# Patient Record
Sex: Male | Born: 1985 | Race: White | Hispanic: No | Marital: Married | Smoking: Never smoker
Health system: Southern US, Community
[De-identification: ages and names within clinical notes are randomized; demographics above are authoritative.]

## PROBLEM LIST (undated history)

## (undated) DIAGNOSIS — F419 Anxiety disorder, unspecified: Secondary | ICD-10-CM

## (undated) DIAGNOSIS — I10 Essential (primary) hypertension: Secondary | ICD-10-CM

## (undated) DIAGNOSIS — N289 Disorder of kidney and ureter, unspecified: Secondary | ICD-10-CM

## (undated) DIAGNOSIS — R0600 Dyspnea, unspecified: Secondary | ICD-10-CM

## (undated) DIAGNOSIS — J449 Chronic obstructive pulmonary disease, unspecified: Secondary | ICD-10-CM

## (undated) DIAGNOSIS — R0609 Other forms of dyspnea: Secondary | ICD-10-CM

## (undated) DIAGNOSIS — R079 Chest pain, unspecified: Secondary | ICD-10-CM

## (undated) DIAGNOSIS — J45909 Unspecified asthma, uncomplicated: Secondary | ICD-10-CM

## (undated) DIAGNOSIS — K760 Fatty (change of) liver, not elsewhere classified: Secondary | ICD-10-CM

## (undated) DIAGNOSIS — F319 Bipolar disorder, unspecified: Secondary | ICD-10-CM

## (undated) DIAGNOSIS — G4730 Sleep apnea, unspecified: Secondary | ICD-10-CM

## (undated) HISTORY — DX: Anxiety disorder, unspecified: F41.9

## (undated) HISTORY — PX: BACK SURGERY: SHX140

## (undated) HISTORY — DX: Morbid (severe) obesity due to excess calories: E66.01

## (undated) HISTORY — DX: Chronic obstructive pulmonary disease, unspecified: J44.9

## (undated) HISTORY — DX: Dyspnea, unspecified: R06.00

## (undated) HISTORY — DX: Bipolar disorder, unspecified: F31.9

## (undated) HISTORY — DX: Other forms of dyspnea: R06.09

## (undated) HISTORY — DX: Fatty (change of) liver, not elsewhere classified: K76.0

## (undated) HISTORY — PX: CARDIAC CATHETERIZATION: SHX172

## (undated) HISTORY — DX: Chest pain, unspecified: R07.9

## (undated) HISTORY — DX: Sleep apnea, unspecified: G47.30

---

## 2004-11-02 ENCOUNTER — Emergency Department: Payer: Self-pay | Admitting: Unknown Physician Specialty

## 2005-02-04 ENCOUNTER — Ambulatory Visit: Payer: Self-pay | Admitting: Pain Medicine

## 2005-03-04 ENCOUNTER — Ambulatory Visit: Payer: Self-pay | Admitting: Pain Medicine

## 2005-03-27 ENCOUNTER — Ambulatory Visit: Payer: Self-pay | Admitting: Pain Medicine

## 2005-04-14 ENCOUNTER — Ambulatory Visit: Payer: Self-pay | Admitting: Physician Assistant

## 2005-05-24 ENCOUNTER — Emergency Department: Payer: Self-pay | Admitting: Unknown Physician Specialty

## 2005-06-09 ENCOUNTER — Encounter: Payer: Self-pay | Admitting: Pain Medicine

## 2005-06-11 ENCOUNTER — Ambulatory Visit: Payer: Self-pay | Admitting: Pain Medicine

## 2005-07-09 ENCOUNTER — Encounter: Payer: Self-pay | Admitting: Pain Medicine

## 2005-10-03 ENCOUNTER — Other Ambulatory Visit: Payer: Self-pay

## 2005-10-08 ENCOUNTER — Inpatient Hospital Stay: Payer: Self-pay | Admitting: Unknown Physician Specialty

## 2006-04-01 ENCOUNTER — Emergency Department: Payer: Self-pay | Admitting: Emergency Medicine

## 2006-08-04 ENCOUNTER — Emergency Department: Payer: Self-pay | Admitting: Emergency Medicine

## 2006-08-12 ENCOUNTER — Emergency Department: Payer: Self-pay | Admitting: Emergency Medicine

## 2007-10-25 ENCOUNTER — Emergency Department: Payer: Self-pay | Admitting: Emergency Medicine

## 2007-10-25 ENCOUNTER — Other Ambulatory Visit: Payer: Self-pay

## 2009-09-27 ENCOUNTER — Emergency Department: Payer: Self-pay | Admitting: Emergency Medicine

## 2011-02-19 ENCOUNTER — Emergency Department: Payer: Self-pay | Admitting: Emergency Medicine

## 2011-02-25 ENCOUNTER — Emergency Department: Payer: Self-pay | Admitting: Emergency Medicine

## 2011-03-10 ENCOUNTER — Ambulatory Visit: Payer: Self-pay

## 2016-09-08 HISTORY — PX: SHOULDER ARTHROSCOPY: SHX128

## 2017-08-07 DIAGNOSIS — I10 Essential (primary) hypertension: Secondary | ICD-10-CM | POA: Insufficient documentation

## 2017-08-07 DIAGNOSIS — E109 Type 1 diabetes mellitus without complications: Secondary | ICD-10-CM | POA: Insufficient documentation

## 2017-08-07 DIAGNOSIS — J4551 Severe persistent asthma with (acute) exacerbation: Secondary | ICD-10-CM | POA: Insufficient documentation

## 2017-08-26 DIAGNOSIS — G4733 Obstructive sleep apnea (adult) (pediatric): Secondary | ICD-10-CM | POA: Insufficient documentation

## 2017-08-26 DIAGNOSIS — Z9989 Dependence on other enabling machines and devices: Secondary | ICD-10-CM | POA: Insufficient documentation

## 2018-11-10 ENCOUNTER — Ambulatory Visit: Payer: Self-pay | Admitting: Family Medicine

## 2019-03-15 ENCOUNTER — Emergency Department: Payer: Self-pay

## 2019-03-15 ENCOUNTER — Encounter: Payer: Self-pay | Admitting: Emergency Medicine

## 2019-03-15 ENCOUNTER — Emergency Department
Admission: EM | Admit: 2019-03-15 | Discharge: 2019-03-15 | Disposition: A | Discharge status: Home / Self Care | Payer: Self-pay | Attending: Emergency Medicine | Admitting: Emergency Medicine

## 2019-03-15 ENCOUNTER — Other Ambulatory Visit: Payer: Self-pay

## 2019-03-15 DIAGNOSIS — R51 Headache: Secondary | ICD-10-CM | POA: Insufficient documentation

## 2019-03-15 DIAGNOSIS — I10 Essential (primary) hypertension: Secondary | ICD-10-CM | POA: Insufficient documentation

## 2019-03-15 DIAGNOSIS — J4520 Mild intermittent asthma, uncomplicated: Secondary | ICD-10-CM | POA: Insufficient documentation

## 2019-03-15 DIAGNOSIS — R0602 Shortness of breath: Secondary | ICD-10-CM | POA: Insufficient documentation

## 2019-03-15 HISTORY — DX: Unspecified asthma, uncomplicated: J45.909

## 2019-03-15 HISTORY — DX: Disorder of kidney and ureter, unspecified: N28.9

## 2019-03-15 HISTORY — DX: Essential (primary) hypertension: I10

## 2019-03-15 LAB — URINALYSIS, COMPLETE (UACMP) WITH MICROSCOPIC
Bacteria, UA: NONE SEEN
Bilirubin Urine: NEGATIVE
Glucose, UA: NEGATIVE mg/dL
Hgb urine dipstick: NEGATIVE
Ketones, ur: NEGATIVE mg/dL
Leukocytes,Ua: NEGATIVE
Nitrite: NEGATIVE
Protein, ur: NEGATIVE mg/dL
Specific Gravity, Urine: 1.026 (ref 1.005–1.030)
pH: 5 (ref 5.0–8.0)

## 2019-03-15 LAB — HEPATIC FUNCTION PANEL
ALT: 31 U/L (ref 0–44)
AST: 21 U/L (ref 15–41)
Albumin: 4.3 g/dL (ref 3.5–5.0)
Alkaline Phosphatase: 70 U/L (ref 38–126)
Bilirubin, Direct: 0.1 mg/dL (ref 0.0–0.2)
Indirect Bilirubin: 0.6 mg/dL (ref 0.3–0.9)
Total Bilirubin: 0.7 mg/dL (ref 0.3–1.2)
Total Protein: 7.6 g/dL (ref 6.5–8.1)

## 2019-03-15 LAB — CBC
HCT: 41.6 % (ref 39.0–52.0)
Hemoglobin: 14 g/dL (ref 13.0–17.0)
MCH: 27 pg (ref 26.0–34.0)
MCHC: 33.7 g/dL (ref 30.0–36.0)
MCV: 80.2 fL (ref 80.0–100.0)
Platelets: 295 10*3/uL (ref 150–400)
RBC: 5.19 MIL/uL (ref 4.22–5.81)
RDW: 13.2 % (ref 11.5–15.5)
WBC: 9.4 10*3/uL (ref 4.0–10.5)
nRBC: 0 % (ref 0.0–0.2)

## 2019-03-15 LAB — BASIC METABOLIC PANEL
Anion gap: 10 (ref 5–15)
BUN: 19 mg/dL (ref 6–20)
CO2: 25 mmol/L (ref 22–32)
Calcium: 9 mg/dL (ref 8.9–10.3)
Chloride: 104 mmol/L (ref 98–111)
Creatinine, Ser: 0.7 mg/dL (ref 0.61–1.24)
GFR calc Af Amer: >60 mL/min (ref >60)
GFR calc non Af Amer: >60 mL/min (ref >60)
Glucose, Bld: 125 mg/dL — ABNORMAL HIGH (ref 70–99)
Potassium: 3.8 mmol/L (ref 3.5–5.1)
Sodium: 139 mmol/L (ref 135–145)

## 2019-03-15 LAB — TROPONIN I (HIGH SENSITIVITY): Troponin I (High Sensitivity): 3 ng/L (ref <18)

## 2019-03-15 MED ORDER — ALBUTEROL SULFATE (2.5 MG/3ML) 0.083% IN NEBU: 2.5000 mg | INHALATION_SOLUTION | Freq: Four times a day (QID) | RESPIRATORY_TRACT | 12 refills | Status: DC | PRN

## 2019-03-15 MED ORDER — IPRATROPIUM-ALBUTEROL 0.5-2.5 (3) MG/3ML IN SOLN
3.0000 mL | Freq: Once | RESPIRATORY_TRACT | Status: AC
  Administered 2019-03-15: 3 mL via RESPIRATORY_TRACT
  Filled 2019-03-15: qty 3

## 2019-03-15 MED ORDER — SODIUM CHLORIDE 0.9% FLUSH: 3.0000 mL | Freq: Once | INTRAVENOUS | Status: DC

## 2019-03-15 MED ORDER — PREDNISONE 20 MG PO TABS
60.0000 mg | ORAL_TABLET | Freq: Once | ORAL | Status: AC
  Administered 2019-03-15: 60 mg via ORAL
  Filled 2019-03-15: qty 3

## 2019-03-15 MED ORDER — ALBUTEROL SULFATE HFA 108 (90 BASE) MCG/ACT IN AERS: 2.0000 | INHALATION_SPRAY | Freq: Four times a day (QID) | RESPIRATORY_TRACT | 0 refills | Status: DC | PRN

## 2019-03-15 NOTE — ED Notes (Signed)
EDP at bedside  

## 2019-03-15 NOTE — ED Notes (Signed)
Pt in xray

## 2019-03-15 NOTE — ED Triage Notes (Addendum)
Says chest pain middle of chest that is tight and sharp for 2 weeks with shortness of breath.  For past 2 days he says his back hurts where his kidneys are.   Also says pain in left eye painf or 3 weeks

## 2019-03-15 NOTE — Discharge Instructions (Signed)
I prescribed you both the albuterol inhaler or the nebulizer treatment.  Do not use both of them the same time.  Use 1 of the other.  You should call [name] drew to make a follow-up appointment for your other concerns.  You should also have your glucose rechecked.

## 2019-03-15 NOTE — ED Notes (Addendum)
Pt states he has been having SHOB for about 2 weeks and states his legs are swollen- pt states he has a history of having "fluid on the lungs" and states this feels similar- pt states he has been having chest pain the past couple days- states he cannot bend over to pick as it makes him too short of breath

## 2019-03-15 NOTE — ED Provider Notes (Signed)
[facility] Emergency Department Provider Note  ____________________________________________   First MD Initiated Contact with Patient 03/15/19 0945     (approximate)  I have reviewed the triage vital signs and the nursing notes.   HISTORY  Chief Complaint Chest Pain, Shortness of Breath, Flank Pain, Back Pain, and Eye Problem    HPI Christian Barrett is a 33 y.o. male with asthma who comes in with multiple complaints.  Patient says that he does not have a primary care doctor to follow-up on all of his concerns.  Patient primary concern is his shortness of breath.  Patient had 2 weeks of shortness of breath that is worse with moving around.  Patient says this feels similar to his prior asthma exacerbations.  He is concerned that he may have a history of fluid on his lungs but he is unsure.  Denies any history of heart failure.  The shortness of breath is mild, worse with ambulating, better at rest, intermittent.  Denies any coughing.  No leg swelling unilaterally, recent travel, recent surgery.  He has a little bit of associated chest discomfort with that.  He also endorses some chronic headaches with little bit of eye pain.  No weakness no slurred speech.  No history of stroke.  He has been taking Tylenol to help with the headaches.       Past Medical History:  Diagnosis Date  . Asthma   . Hypertension   . Renal disorder     There are no active problems to display for this patient.   Past Surgical History:  Procedure Laterality Date  . BACK SURGERY      Prior to Admission medications   Not on File    Allergies Lamotrigine and Tramadol  No family history on file.  Social History Social History   Tobacco Use  . Smoking status: Never Smoker  . Smokeless tobacco: Never Used  Substance Use Topics  . Alcohol use: Never    Frequency: Never  . Drug use: Not on file      Review of Systems Constitutional: No fever/chills Eyes: No visual  changes. ENT: No sore throat. Cardiovascular: Positive chest pain. Respiratory: Positive shortness of breath Gastrointestinal: No abdominal pain.  No nausea, no vomiting.  No diarrhea.  No constipation. Genitourinary: Negative for dysuria. Musculoskeletal: Negative for back pain. Skin: Negative for rash. Neurological: Positive headaches but negative for focal weakness or numbness. All other ROS negative ____________________________________________   PHYSICAL EXAM:  VITAL SIGNS: ED Triage Vitals [03/15/19 0850]  Enc Vitals Group     BP (!) 142/94     Pulse Rate (!) 104     Resp 18     Temp 98.8 F (37.1 C)     Temp Source Oral     SpO2 98 %     Weight (!) 350 lb (158.8 kg)     Height 5\' 5"  (1.651 m)     Head Circumference      Peak Flow      Pain Score 5     Pain Loc      Pain Edu?      Excl. in [place]?     Constitutional: Alert and oriented. Well appearing and in no acute distress.  Obese male Eyes: Conjunctivae are normal. EOMI. PERRL Head: Atraumatic. Nose: No congestion/rhinnorhea. Mouth/Throat: Mucous membranes are moist.   Neck: No stridor. Trachea Midline. FROM Cardiovascular: Slightly tachycardic regular rhythm. Grossly normal heart sounds.  Good peripheral circulation. Respiratory: Normal  respiratory effort.  Poor air exchange with minimal expiratory wheezing.  No retractions. Lungs CTAB. Gastrointestinal: Soft and nontender. No distention. No abdominal bruits.  Musculoskeletal: No lower extremity tenderness nor edema.  No joint effusions. Neurologic:  Normal speech and language.  Cranial nerves II through XII intact.  Equal strength bilaterally. Skin:  Skin is warm, dry and intact. No rash noted. Psychiatric: Mood and affect are normal. Speech and behavior are normal. GU: Deferred   ____________________________________________   LABS (all labs ordered are listed, but only abnormal results are displayed)  Labs Reviewed  BASIC METABOLIC PANEL - Abnormal;  Notable for the following components:      Result Value   Glucose, Bld 125 (*)    All other components within normal limits  URINALYSIS, COMPLETE (UACMP) WITH MICROSCOPIC - Abnormal; Notable for the following components:   Color, Urine YELLOW (*)    APPearance CLEAR (*)    All other components within normal limits  CBC  TROPONIN I (HIGH SENSITIVITY)  TROPONIN I (HIGH SENSITIVITY)   ____________________________________________   ED ECG REPORT I, Concha SeMary E Aziza Stuckert, the attending physician, personally viewed and interpreted this ECG. EKG normal sinus rate of 96, incomplete right bundle-branch block, no ST elevation, no T wave inversion.  ____________________________________________  RADIOLOGY Vela ProseI, Adriaan Maltese E Keiron Iodice, personally viewed and evaluated these images (plain radiographs) as part of my medical decision making, as well as reviewing the written report by the radiologist.  ED MD interpretation: Chest x-ray negative  Official radiology report(s): Dg Chest 2 View  Result Date: 03/15/2019 CLINICAL DATA:  Chest pain, shortness of breath. EXAM: CHEST - 2 VIEW COMPARISON:  Radiographs of October 03, 2005. FINDINGS: The heart size and mediastinal contours are within normal limits. Both lungs are clear. No pneumothorax or pleural effusion is noted. The visualized skeletal structures are unremarkable. IMPRESSION: No active cardiopulmonary disease. Electronically Signed   By: Lupita RaiderJames  Green Jr M.D.   On: 03/15/2019 09:58    ____________________________________________   PROCEDURES  Procedure(s) performed (including Critical Care):  Procedures   ____________________________________________   INITIAL IMPRESSION / ASSESSMENT AND PLAN / ED COURSE     Leamon ArntMatthew J Muff was evaluated in Emergency Department on 03/15/2019 for the symptoms described in the history of present illness. He was evaluated in the context of the global COVID-19 pandemic, which necessitated consideration that the patient  might be at risk for infection with the SARS-CoV-2 virus that causes COVID-19. Institutional protocols and algorithms that pertain to the evaluation of patients at risk for COVID-19 are in a state of rapid change based on information released by regulatory bodies including the CDC and federal and state organizations. These policies and algorithms were followed during the patient's care in the ED.     Patient has multiple concerns.  He is not been able take any of his medications for his asthma.  Given patient's exam this is most likely an asthma flare.  Will give patient a DuoNeb and reassess patient's shortness of breath.  Will get cardiac markers to evaluate for ACS.  Will get a urine to evaluate for UTI.  Will get chest x-ray to evaluate for pneumonia.  Consider PE but seems unlikely given patient has no unilateral leg swelling, recent travel, surgeries.  He also is on his feet 8 hours a day at work.  It seems that his symptoms are more likely related to his asthma and not being on his medications.  For patient's headaches he has a normal neuro exam.  Low suspicion for intracranial mass.  His eye pain is most likely related to his migraines given that it comes on at the same time.  No evidence of glaucoma on examination.  Encourage symptomatic treatment at this time but can follow-up with primary care doctor if headaches are continuing.    Clinical Course as of Mar 14 1010  Tue Mar 15, 2019  [id number] Urine negative for infection.  Troponin is 3.  Glucose slightly elevated at 125   [MF]  1010 Chest xray negative   [MF]    Clinical Course User Index [MF] Concha SeFunke, Jhase Creppel E, MD    11:23 AM reevaluated patient after DuoNeb.  Patient is feeling better.  Physical shortness of breath is much better.  Patient also brings up another concern of some bloating feeling in his stomach.  Low suspicion this is related to edema given normal chest x-ray.  However will get LFTs to evaluate for the possibility of liver  issues.  Patient does not want to wait for this test to come back.  Patient would prefer to leave.  Patient would like follow-up primary care doctor as well as prescriptions for his inhalers.  He does not want steroids given that it makes him feel even more bloated.  Patient understands he should return the ER if he develops worsening shortness of breath or any other concerns.  Patient requesting a prescription for his albuterol nebulizer as well as inhaler.  Patient instructed not to use them combination.  Patient also alerted that his glucose was elevated and that he needs to have this followed up because he may be developing early onset diabetes. ____________________________________________   FINAL CLINICAL IMPRESSION(S) / ED DIAGNOSES   Final diagnoses:  Mild intermittent asthma, unspecified whether complicated      MEDICATIONS GIVEN DURING THIS VISIT:  Medications  ipratropium-albuterol (DUONEB) 0.5-2.5 (3) MG/3ML nebulizer solution 3 mL (3 mLs Nebulization Given 03/15/19 1034)  predniSONE (DELTASONE) tablet 60 mg (60 mg Oral Given 03/15/19 1031)     ED Discharge Orders         Ordered    albuterol (PROVENTIL) (2.5 MG/3ML) 0.083% nebulizer solution  Every 6 hours PRN     03/15/19 1133    albuterol (VENTOLIN HFA) 108 (90 Base) MCG/ACT inhaler  Every 6 hours PRN     03/15/19 1133           Note:  This document was prepared using Dragon voice recognition software and may include unintentional dictation errors.   Concha SeFunke, Lachlan Pelto E, MD 03/15/19 1620

## 2019-05-12 ENCOUNTER — Other Ambulatory Visit: Payer: Self-pay

## 2019-05-12 DIAGNOSIS — Z20822 Contact with and (suspected) exposure to covid-19: Secondary | ICD-10-CM

## 2019-05-13 LAB — NOVEL CORONAVIRUS, NAA: SARS-CoV-2, NAA: NOT DETECTED

## 2019-05-19 ENCOUNTER — Emergency Department
Admission: EM | Admit: 2019-05-19 | Discharge: 2019-05-19 | Disposition: A | Discharge status: Home / Self Care | Payer: Self-pay | Attending: Emergency Medicine | Admitting: Emergency Medicine

## 2019-05-19 ENCOUNTER — Other Ambulatory Visit: Payer: Self-pay

## 2019-05-19 DIAGNOSIS — J069 Acute upper respiratory infection, unspecified: Secondary | ICD-10-CM | POA: Insufficient documentation

## 2019-05-19 DIAGNOSIS — J45909 Unspecified asthma, uncomplicated: Secondary | ICD-10-CM | POA: Insufficient documentation

## 2019-05-19 DIAGNOSIS — Z20828 Contact with and (suspected) exposure to other viral communicable diseases: Secondary | ICD-10-CM | POA: Insufficient documentation

## 2019-05-19 DIAGNOSIS — I10 Essential (primary) hypertension: Secondary | ICD-10-CM | POA: Insufficient documentation

## 2019-05-19 MED ORDER — ALBUTEROL SULFATE (2.5 MG/3ML) 0.083% IN NEBU: 2.5000 mg | INHALATION_SOLUTION | Freq: Four times a day (QID) | RESPIRATORY_TRACT | 12 refills | Status: DC | PRN

## 2019-05-19 NOTE — ED Notes (Signed)
Pt states that starting yesterday he developed a sore throat, cough, runny nose, and headache. States that he has a fever of 100.1 yesterday but has taken nyquil which has reduced his fever.

## 2019-05-19 NOTE — ED Provider Notes (Signed)
[facility] Emergency Department Provider Note  ____________________________________________  Time seen: Approximately 8:20 PM  I have reviewed the triage vital signs and the nursing notes.   HISTORY  Chief Complaint URI    HPI Christian Barrett is a 33 y.o. male presents to the emergency department with low-grade fever, pharyngitis, nasal congestion and rhinorrhea for the past 2 to 3 days.  Patient denies chest pain, chest tightness and shortness of breath.  No emesis or diarrhea.  Patient states that he was tested for COVID-19 several days ago and tested negative but states that he is due to return to work and he still has fever.  No known contacts with COVID-19.  Patient currently works in a [employer].        Past Medical History:  Diagnosis Date  . Asthma   . Hypertension   . Renal disorder     There are no active problems to display for this patient.   Past Surgical History:  Procedure Laterality Date  . BACK SURGERY      Prior to Admission medications   Medication Sig Start Date End Date Taking? Authorizing Provider  albuterol (PROVENTIL) (2.5 MG/3ML) 0.083% nebulizer solution Take 3 mLs (2.5 mg total) by nebulization every 6 (six) hours as needed for wheezing or shortness of breath. 05/19/19   Lannie Fields, PA-C    Allergies Lamotrigine and Tramadol  No family history on file.  Social History Social History   Tobacco Use  . Smoking status: Never Smoker  . Smokeless tobacco: Never Used  Substance Use Topics  . Alcohol use: Never    Frequency: Never  . Drug use: Not on file      Review of Systems  Constitutional: Patient has fever.  Eyes: No visual changes. No discharge ENT: Patient has congestion.  Cardiovascular: no chest pain. Respiratory: Patient has cough.  Gastrointestinal: No abdominal pain.  No nausea, no vomiting. Patient had diarrhea.  Genitourinary: Negative for dysuria. No hematuria Musculoskeletal: Patient  has myalgias.  Skin: Negative for rash, abrasions, lacerations, ecchymosis. Neurological: Patient has headache, no focal weakness or numbness.    ____________________________________________   PHYSICAL EXAM:  VITAL SIGNS: ED Triage Vitals  Enc Vitals Group     BP 05/19/19 1541 (!) 133/92     Pulse Rate 05/19/19 1541 99     Resp 05/19/19 1541 17     Temp 05/19/19 1541 98.6 F (37 C)     Temp Source 05/19/19 1541 Oral     SpO2 05/19/19 1541 98 %     Weight 05/19/19 1542 (!) 350 lb (158.8 kg)     Height 05/19/19 1542 5\' 5"  (1.651 m)     Head Circumference --      Peak Flow --      Pain Score 05/19/19 1542 3     Pain Loc --      Pain Edu? --      Excl. in [place]? --     Constitutional: Alert and oriented. Patient is lying supine. Eyes: Conjunctivae are normal. PERRL. EOMI. Head: Atraumatic. ENT:      Ears: Tympanic membranes are mildly injected with mild effusion bilaterally.       Nose: No congestion/rhinnorhea.      Mouth/Throat: Mucous membranes are moist. Posterior pharynx is mildly erythematous.  Hematological/Lymphatic/Immunilogical: No cervical lymphadenopathy.  Cardiovascular: Normal rate, regular rhythm. Normal S1 and S2.  Good peripheral circulation. Respiratory: Normal respiratory effort without tachypnea or retractions. Lungs CTAB. Good air entry  to the bases with no decreased or absent breath sounds. Gastrointestinal: Bowel sounds 4 quadrants. Soft and nontender to palpation. No guarding or rigidity. No palpable masses. No distention. No CVA tenderness. Musculoskeletal: Full range of motion to all extremities. No gross deformities appreciated. Neurologic:  Normal speech and language. No gross focal neurologic deficits are appreciated.  Skin:  Skin is warm, dry and intact. No rash noted. Psychiatric: Mood and affect are normal. Speech and behavior are normal. Patient exhibits appropriate insight and judgement.    ____________________________________________    LABS (all labs ordered are listed, but only abnormal results are displayed)  Labs Reviewed  SARS CORONAVIRUS 2 (TAT 6-24 HRS)   ____________________________________________  EKG   ____________________________________________  RADIOLOGY   No results found.  ____________________________________________    PROCEDURES  Procedure(s) performed:    Procedures    Medications - No data to display   ____________________________________________   INITIAL IMPRESSION / ASSESSMENT AND PLAN / ED COURSE  Pertinent labs & imaging results that were available during my care of the patient were reviewed by me and considered in my medical decision making (see chart for details).  Review of the [place] CSRS was performed in accordance of the [facility] prior to dispensing any controlled drugs.         Assessment and plan Viral URI 33 year old male presents to the emergency department with pharyngitis, cough, rhinorrhea and headache.  Patient is also had low-grade fever.  Patient was tested for COVID-19.  Results are pending.  Patient was advised to 72 hours after fever resolves.  Tylenol was recommended for fever.  All patient questions were answered.  Christian Barrett was evaluated in Emergency Department on 05/19/2019 for the symptoms described in the history of present illness. He was evaluated in the context of the global COVID-19 pandemic, which necessitated consideration that the patient might be at risk for infection with the SARS-CoV-2 virus that causes COVID-19. Institutional protocols and algorithms that pertain to the evaluation of patients at risk for COVID-19 are in a state of rapid change based on information released by regulatory bodies including the CDC and federal and state organizations. These policies and algorithms were followed during the patient's care in the ED.     ____________________________________________  FINAL CLINICAL IMPRESSION(S) / ED DIAGNOSES  Final  diagnoses:  Viral upper respiratory tract infection      NEW MEDICATIONS STARTED DURING THIS VISIT:  ED Discharge Orders         Ordered    albuterol (PROVENTIL) (2.5 MG/3ML) 0.083% nebulizer solution  Every 6 hours PRN     05/19/19 1649              This chart was dictated using voice recognition software/Dragon. Despite best efforts to proofread, errors can occur which can change the meaning. Any change was purely unintentional.    Orvil FeilWoods, Jaclyn M, PA-C 05/19/19 2158    Concha SeFunke, Mary E, MD 05/24/19 1155

## 2019-05-19 NOTE — ED Triage Notes (Signed)
Pt c/o cough, sore throat with fever since yesterday. States he had abd pain last week and had neg test.

## 2019-05-19 NOTE — Discharge Instructions (Signed)
Stay quarantined at home for 7 days in early 72 hours after fever resolves.

## 2019-05-20 LAB — SARS CORONAVIRUS 2 (TAT 6-24 HRS): SARS Coronavirus 2: NEGATIVE

## 2019-07-08 ENCOUNTER — Other Ambulatory Visit: Payer: Self-pay

## 2019-07-08 DIAGNOSIS — Z20822 Contact with and (suspected) exposure to covid-19: Secondary | ICD-10-CM

## 2019-07-09 LAB — NOVEL CORONAVIRUS, NAA: SARS-CoV-2, NAA: NOT DETECTED

## 2019-07-17 ENCOUNTER — Emergency Department
Admission: EM | Admit: 2019-07-17 | Discharge: 2019-07-17 | Disposition: A | Discharge status: Home / Self Care | Payer: Self-pay | Attending: Emergency Medicine | Admitting: Emergency Medicine

## 2019-07-17 ENCOUNTER — Emergency Department: Payer: Self-pay

## 2019-07-17 ENCOUNTER — Other Ambulatory Visit: Payer: Self-pay

## 2019-07-17 DIAGNOSIS — J45909 Unspecified asthma, uncomplicated: Secondary | ICD-10-CM | POA: Insufficient documentation

## 2019-07-17 DIAGNOSIS — K8020 Calculus of gallbladder without cholecystitis without obstruction: Secondary | ICD-10-CM

## 2019-07-17 DIAGNOSIS — K828 Other specified diseases of gallbladder: Secondary | ICD-10-CM

## 2019-07-17 DIAGNOSIS — I10 Essential (primary) hypertension: Secondary | ICD-10-CM | POA: Insufficient documentation

## 2019-07-17 DIAGNOSIS — Z79899 Other long term (current) drug therapy: Secondary | ICD-10-CM | POA: Insufficient documentation

## 2019-07-17 DIAGNOSIS — K829 Disease of gallbladder, unspecified: Secondary | ICD-10-CM | POA: Insufficient documentation

## 2019-07-17 LAB — URINALYSIS, COMPLETE (UACMP) WITH MICROSCOPIC
Bacteria, UA: NONE SEEN
Bilirubin Urine: NEGATIVE
Glucose, UA: NEGATIVE mg/dL
Hgb urine dipstick: NEGATIVE
Ketones, ur: NEGATIVE mg/dL
Leukocytes,Ua: NEGATIVE
Nitrite: NEGATIVE
Protein, ur: NEGATIVE mg/dL
Specific Gravity, Urine: 1.025 (ref 1.005–1.030)
pH: 6 (ref 5.0–8.0)

## 2019-07-17 LAB — COMPREHENSIVE METABOLIC PANEL
ALT: 33 U/L (ref 0–44)
AST: 23 U/L (ref 15–41)
Albumin: 4.1 g/dL (ref 3.5–5.0)
Alkaline Phosphatase: 77 U/L (ref 38–126)
Anion gap: 11 (ref 5–15)
BUN: 17 mg/dL (ref 6–20)
CO2: 24 mmol/L (ref 22–32)
Calcium: 9 mg/dL (ref 8.9–10.3)
Chloride: 103 mmol/L (ref 98–111)
Creatinine, Ser: 0.61 mg/dL (ref 0.61–1.24)
GFR calc Af Amer: >60 mL/min (ref >60)
GFR calc non Af Amer: >60 mL/min (ref >60)
Glucose, Bld: 133 mg/dL — ABNORMAL HIGH (ref 70–99)
Potassium: 3.7 mmol/L (ref 3.5–5.1)
Sodium: 138 mmol/L (ref 135–145)
Total Bilirubin: 0.5 mg/dL (ref 0.3–1.2)
Total Protein: 7.5 g/dL (ref 6.5–8.1)

## 2019-07-17 LAB — LIPASE, BLOOD: Lipase: 32 U/L (ref 11–51)

## 2019-07-17 LAB — CBC
HCT: 41.3 % (ref 39.0–52.0)
Hemoglobin: 13.9 g/dL (ref 13.0–17.0)
MCH: 27 pg (ref 26.0–34.0)
MCHC: 33.7 g/dL (ref 30.0–36.0)
MCV: 80.2 fL (ref 80.0–100.0)
Platelets: 295 10*3/uL (ref 150–400)
RBC: 5.15 MIL/uL (ref 4.22–5.81)
RDW: 12.9 % (ref 11.5–15.5)
WBC: 11.3 10*3/uL — ABNORMAL HIGH (ref 4.0–10.5)
nRBC: 0 % (ref 0.0–0.2)

## 2019-07-17 MED ORDER — METOCLOPRAMIDE HCL 5 MG PO TABS: 5.0000 mg | ORAL_TABLET | Freq: Three times a day (TID) | ORAL | 0 refills | Status: DC | PRN

## 2019-07-17 MED ORDER — HYDROCODONE-ACETAMINOPHEN 5-325 MG PO TABS
1.0000 | ORAL_TABLET | Freq: Once | ORAL | Status: AC
  Administered 2019-07-17: 1 via ORAL
  Filled 2019-07-17: qty 1

## 2019-07-17 MED ORDER — HYDROCODONE-ACETAMINOPHEN 5-325 MG PO TABS: 1.0000 | ORAL_TABLET | Freq: Three times a day (TID) | ORAL | 0 refills | Status: AC | PRN

## 2019-07-17 MED ORDER — PANTOPRAZOLE SODIUM 20 MG PO TBEC: 20.0000 mg | DELAYED_RELEASE_TABLET | Freq: Every day | ORAL | 2 refills | Status: DC

## 2019-07-17 MED ORDER — METOCLOPRAMIDE HCL 10 MG PO TABS
10.0000 mg | ORAL_TABLET | Freq: Once | ORAL | Status: AC
  Administered 2019-07-17: 10 mg via ORAL
  Filled 2019-07-17: qty 1

## 2019-07-17 MED ORDER — LISINOPRIL 10 MG PO TABS: 10.0000 mg | ORAL_TABLET | Freq: Every day | ORAL | 2 refills | Status: DC

## 2019-07-17 MED ORDER — LISINOPRIL 10 MG PO TABS
10.0000 mg | ORAL_TABLET | Freq: Once | ORAL | Status: AC
  Administered 2019-07-17: 10 mg via ORAL
  Filled 2019-07-17: qty 1

## 2019-07-17 MED ORDER — SUCRALFATE 1 G PO TABS
1.0000 g | ORAL_TABLET | Freq: Once | ORAL | Status: AC
  Administered 2019-07-17: 1 g via ORAL
  Filled 2019-07-17: qty 1

## 2019-07-17 MED ORDER — SUCRALFATE 1 G PO TABS: 1.0000 g | ORAL_TABLET | Freq: Four times a day (QID) | ORAL | 0 refills | Status: DC

## 2019-07-17 NOTE — ED Notes (Signed)
Pt has ruq pain for 1 week.  Pain radiates into back.  No n/v  No urinary sx.  Pt alert  Speech clear.

## 2019-07-17 NOTE — ED Triage Notes (Addendum)
Pt c/o RUQ pain and mid back pain immediately after eating. Pt states he also wakes up every morning with rotten egg taste in mouth.   Pt states his only relief is when he is laying on his stomach.  Pt has been taking Midol for the pain.

## 2019-07-17 NOTE — ED Provider Notes (Signed)
[facility] Emergency Department Provider Note ____________________________________________  Time seen: 2055  I have reviewed the triage vital signs and the nursing notes.  HISTORY  Chief Complaint  Abdominal Pain  HPI Christian Barrett is a 33 y.o. male presents to the ED for evaluation of RUQ pain over the last week. The pain is worsened after a high-fat meal. He notes associated nausea without vomiting, and crampy pain about 15 minutes after eating. The pain is resolved with lying on his back for about an hour. He denies fevers, chills, or vomiting. He is without chest pain or SOB. He reports light brown stools without diarrhea.    Past Medical History:  Diagnosis Date  . Asthma   . Hypertension   . Renal disorder     There are no active problems to display for this patient.   Past Surgical History:  Procedure Laterality Date  . BACK SURGERY      Prior to Admission medications   Medication Sig Start Date End Date Taking? Authorizing Provider  albuterol (PROVENTIL) (2.5 MG/3ML) 0.083% nebulizer solution Take 3 mLs (2.5 mg total) by nebulization every 6 (six) hours as needed for wheezing or shortness of breath. 05/19/19   Orvil FeilWoods, Jaclyn M, PA-C  HYDROcodone-acetaminophen (NORCO) 5-325 MG tablet Take 1 tablet by mouth 3 (three) times daily as needed for up to 5 days. 07/17/19 07/22/19  Kyree Fedorko, Charlesetta IvoryJenise V Bacon, PA-C  lisinopril (ZESTRIL) 10 MG tablet Take 1 tablet (10 mg total) by mouth daily. 07/17/19 10/15/19  Shakyia Bosso, Charlesetta IvoryJenise V Bacon, PA-C  metoCLOPramide (REGLAN) 5 MG tablet Take 1 tablet (5 mg total) by mouth every 8 (eight) hours as needed for up to 5 days for nausea or vomiting. 07/17/19 07/22/19  Dorman Calderwood, Charlesetta IvoryJenise V Bacon, PA-C  pantoprazole (PROTONIX) 20 MG tablet Take 1 tablet (20 mg total) by mouth daily. 07/17/19 10/15/19  Kavari Parrillo, Charlesetta IvoryJenise V Bacon, PA-C  sucralfate (CARAFATE) 1 g tablet Take 1 tablet (1 g total) by mouth 4 (four) times daily. 07/17/19 08/16/19   Lynda Capistran, Charlesetta IvoryJenise V Bacon, PA-C    Allergies Lamotrigine and Tramadol  No family history on file.  Social History Social History   Tobacco Use  . Smoking status: Never Smoker  . Smokeless tobacco: Never Used  Substance Use Topics  . Alcohol use: Never    Frequency: Never  . Drug use: Not on file    Review of Systems  Constitutional: Negative for fever. Eyes: Negative for visual changes. ENT: Negative for sore throat. Cardiovascular: Negative for chest pain. Respiratory: Negative for shortness of breath. Gastrointestinal: Positive for abdominal pain and vomiting. Denies constipation and diarrhea. Genitourinary: Negative for dysuria. Musculoskeletal: Negative for back pain. Skin: Negative for rash. Neurological: Negative for headaches, focal weakness or numbness. ____________________________________________  PHYSICAL EXAM:  VITAL SIGNS: ED Triage Vitals  Enc Vitals Group     BP 07/17/19 1746 (!) 149/74     Pulse --      Resp 07/17/19 1746 18     Temp 07/17/19 1746 99 F (37.2 C)     Temp Source 07/17/19 1746 Oral     SpO2 07/17/19 1746 96 %     Weight 07/17/19 1742 (!) 350 lb (158.8 kg)     Height 07/17/19 1742 5\' 5"  (1.651 m)     Head Circumference --      Peak Flow --      Pain Score 07/17/19 1741 7     Pain Loc --  Pain Edu? --      Excl. in [place]? --     Constitutional: Alert and oriented. Well appearing and in no distress. Head: Normocephalic and atraumatic. Eyes: Conjunctivae are normal. Normal extraocular movements Cardiovascular: Normal rate, regular rhythm. Normal distal pulses. Respiratory: Normal respiratory effort. No wheezes/rales/rhonchi. Gastrointestinal: Soft, protuberant, and mildly tender to palpation. No distention. Positive inspiratory halt with palpation over the RUQ. Normal bowel sounds noted No CVA tenderness.  Musculoskeletal: Nontender with normal range of motion in all extremities.  Neurologic:  Normal gait without ataxia. Normal  speech and language. No gross focal neurologic deficits are appreciated. Skin:  Skin is warm, dry and intact. No rash noted. ____________________________________________   LABS (pertinent positives/negatives)  Labs Reviewed  COMPREHENSIVE METABOLIC PANEL - Abnormal; Notable for the following components:      Result Value   Glucose, Bld 133 (*)    All other components within normal limits  CBC - Abnormal; Notable for the following components:   WBC 11.3 (*)    All other components within normal limits  URINALYSIS, COMPLETE (UACMP) WITH MICROSCOPIC - Abnormal; Notable for the following components:   Color, Urine YELLOW (*)    APPearance CLEAR (*)    All other components within normal limits  LIPASE, BLOOD  ____________________________________________   EKG  See Report   _____________________________________________  RADIOLOGY  [place] RUQ Limited  IMPRESSION: 1. Contracted, but otherwise normal, gallbladder. 2. Hepatic steatosis. ____________________________________________  PROCEDURES  Norco 5-325 mg PO Carafate 1 g PO Reglan 10 mg PO Procedures ____________________________________________  INITIAL IMPRESSION / ASSESSMENT AND PLAN / ED COURSE  Differential diagnosis includes, but is not limited to, biliary disease (biliary colic, acute cholecystitis, cholangitis, choledocholithiasis, etc), intrathoracic causes for epigastric abdominal pain including ACS, gastritis, duodenitis, pancreatitis, small bowel or large bowel obstruction, abdominal aortic aneurysm, hernia, and ulcer(s).  Patient with ED evaluation of a 1 week complaint of intermittent right upper quadrant pain with accompanying nausea without vomiting.  Clinical picture is concerning for probable gallbladder etiology.  Patient's labs overall reassuring as it shows no white blood cell count elevation or elevated lipase at this time.  Ultrasound also does not show any gallstones, gallbladder wall thickening, sludge, or  other signs of acute abdominal process.  Patient will be discharged at this time instructions on a low-fat diet and management of gallbladder dysfunction/colic.  He will be discharged with prescriptions for Reglan, hydrocodone, Carafate, and Protonix.  He is also given a courtesy refill of his blood pressure medicine.  Patient was referred to GI for nonemergent management of his biliary colic.  He is also given strict return precautions for any worsening symptoms acutely.  He will follow-up as directed patient verbalized understanding of discharge directions.  STEPAN VERRETTE was evaluated in Emergency Department on 07/17/2019 for the symptoms described in the history of present illness. He was evaluated in the context of the global COVID-19 pandemic, which necessitated consideration that the patient might be at risk for infection with the SARS-CoV-2 virus that causes COVID-19. Institutional protocols and algorithms that pertain to the evaluation of patients at risk for COVID-19 are in a state of rapid change based on information released by regulatory bodies including the CDC and federal and state organizations. These policies and algorithms were followed during the patient's care in the ED.  I reviewed the patient's prescription history over the last 12 months in the multi-state controlled substances database(s) that includes [place], [place], [place], [place], [place], [place], [place], [place], [place],  [place], [place], [place], [place], and [place].  Results were notable for no current RX.  ____________________________________________  FINAL CLINICAL IMPRESSION(S) / ED DIAGNOSES  Final diagnoses:  Dysfunctional gallbladder  Gallbladder colic      Karmen Stabs, Charlesetta Ivory, PA-C 07/17/19 2241    Phineas Semen, MD 07/17/19 2241

## 2019-07-17 NOTE — Discharge Instructions (Signed)
Your exam, labs, and [place] are essentially normal at this time. You do not have any evidence of gallstones on ultrasound. You likely are having symptoms of poor gallbladder function. This can cause the pain and discomfort you have been experiencing. Take the prescription meds as directed. Drink plenty of fluids and increase fiber, fruits, and veggies. Adopt a lower fat diet and avoid spicy/greasy foods. Follow-up with the GI specialist above for routine management. Return to the ED immediately for sudden symptoms as discussed.

## 2019-07-25 ENCOUNTER — Emergency Department: Payer: Self-pay

## 2019-07-25 ENCOUNTER — Encounter: Payer: Self-pay | Admitting: Emergency Medicine

## 2019-07-25 ENCOUNTER — Other Ambulatory Visit: Payer: Self-pay

## 2019-07-25 ENCOUNTER — Emergency Department
Admission: EM | Admit: 2019-07-25 | Discharge: 2019-07-25 | Disposition: A | Discharge status: Home / Self Care | Payer: Self-pay | Attending: Emergency Medicine | Admitting: Emergency Medicine

## 2019-07-25 DIAGNOSIS — I10 Essential (primary) hypertension: Secondary | ICD-10-CM | POA: Insufficient documentation

## 2019-07-25 DIAGNOSIS — R1011 Right upper quadrant pain: Secondary | ICD-10-CM | POA: Insufficient documentation

## 2019-07-25 DIAGNOSIS — K760 Fatty (change of) liver, not elsewhere classified: Secondary | ICD-10-CM | POA: Insufficient documentation

## 2019-07-25 DIAGNOSIS — J45909 Unspecified asthma, uncomplicated: Secondary | ICD-10-CM | POA: Insufficient documentation

## 2019-07-25 DIAGNOSIS — Z79899 Other long term (current) drug therapy: Secondary | ICD-10-CM | POA: Insufficient documentation

## 2019-07-25 DIAGNOSIS — R1010 Upper abdominal pain, unspecified: Secondary | ICD-10-CM | POA: Insufficient documentation

## 2019-07-25 LAB — COMPREHENSIVE METABOLIC PANEL
ALT: 33 U/L (ref 0–44)
AST: 21 U/L (ref 15–41)
Albumin: 4.5 g/dL (ref 3.5–5.0)
Alkaline Phosphatase: 78 U/L (ref 38–126)
Anion gap: 11 (ref 5–15)
BUN: 17 mg/dL (ref 6–20)
CO2: 24 mmol/L (ref 22–32)
Calcium: 9.2 mg/dL (ref 8.9–10.3)
Chloride: 103 mmol/L (ref 98–111)
Creatinine, Ser: 0.55 mg/dL — ABNORMAL LOW (ref 0.61–1.24)
GFR calc Af Amer: >60 mL/min (ref >60)
GFR calc non Af Amer: >60 mL/min (ref >60)
Glucose, Bld: 112 mg/dL — ABNORMAL HIGH (ref 70–99)
Potassium: 4.1 mmol/L (ref 3.5–5.1)
Sodium: 138 mmol/L (ref 135–145)
Total Bilirubin: 0.6 mg/dL (ref 0.3–1.2)
Total Protein: 8.2 g/dL — ABNORMAL HIGH (ref 6.5–8.1)

## 2019-07-25 LAB — CBC
HCT: 42 % (ref 39.0–52.0)
Hemoglobin: 14 g/dL (ref 13.0–17.0)
MCH: 26.7 pg (ref 26.0–34.0)
MCHC: 33.3 g/dL (ref 30.0–36.0)
MCV: 80 fL (ref 80.0–100.0)
Platelets: 304 10*3/uL (ref 150–400)
RBC: 5.25 MIL/uL (ref 4.22–5.81)
RDW: 12.8 % (ref 11.5–15.5)
WBC: 10 10*3/uL (ref 4.0–10.5)
nRBC: 0 % (ref 0.0–0.2)

## 2019-07-25 LAB — LIPASE, BLOOD: Lipase: 30 U/L (ref 11–51)

## 2019-07-25 MED ORDER — MORPHINE SULFATE (PF) 4 MG/ML IV SOLN
4.0000 mg | Freq: Once | INTRAVENOUS | Status: AC
  Administered 2019-07-25: 12:00:00 4 mg via INTRAVENOUS
  Filled 2019-07-25: qty 1

## 2019-07-25 MED ORDER — ONDANSETRON HCL 4 MG/2ML IJ SOLN
4.0000 mg | Freq: Once | INTRAMUSCULAR | Status: AC
  Administered 2019-07-25: 12:00:00 4 mg via INTRAVENOUS
  Filled 2019-07-25: qty 2

## 2019-07-25 NOTE — Discharge Instructions (Signed)
Follow-up with [facility] or the [place] [facility].  Please call for an appointment.  Try to decrease your fatty food intake.  Exercise.  Avoid alcohol.

## 2019-07-25 NOTE — ED Notes (Signed)
Instructed not to eat or drink prior to u/s

## 2019-07-25 NOTE — ED Provider Notes (Signed)
[facility] Emergency Department Provider Note  ____________________________________________   First MD Initiated Contact with Patient 07/25/19 1112     (approximate)  I have reviewed the triage vital signs and the nursing notes.   HISTORY  Chief Complaint Abdominal Pain    HPI Christian Barrett is a 33 y.o. male presents emergency department complaining of right upper quadrant pain.  Patient was told he has gallstones needs to have his gallbladder removed.  Denies any fever or chills.  States pain is severe today.  No vomiting or diarrhea at this time.    Past Medical History:  Diagnosis Date  . Asthma   . Hypertension   . Renal disorder     There are no active problems to display for this patient.   Past Surgical History:  Procedure Laterality Date  . BACK SURGERY      Prior to Admission medications   Medication Sig Start Date End Date Taking? Authorizing Provider  albuterol (PROVENTIL) (2.5 MG/3ML) 0.083% nebulizer solution Take 3 mLs (2.5 mg total) by nebulization every 6 (six) hours as needed for wheezing or shortness of breath. 05/19/19   Lannie Fields, PA-C  lisinopril (ZESTRIL) 10 MG tablet Take 1 tablet (10 mg total) by mouth daily. 07/17/19 10/15/19  Menshew, Dannielle Karvonen, PA-C  metoCLOPramide (REGLAN) 5 MG tablet Take 1 tablet (5 mg total) by mouth every 8 (eight) hours as needed for up to 5 days for nausea or vomiting. 07/17/19 07/22/19  Menshew, Dannielle Karvonen, PA-C  pantoprazole (PROTONIX) 20 MG tablet Take 1 tablet (20 mg total) by mouth daily. 07/17/19 10/15/19  Menshew, Dannielle Karvonen, PA-C  sucralfate (CARAFATE) 1 g tablet Take 1 tablet (1 g total) by mouth 4 (four) times daily. 07/17/19 08/16/19  Menshew, Dannielle Karvonen, PA-C    Allergies Lamotrigine and Tramadol  No family history on file.  Social History Social History   Tobacco Use  . Smoking status: Never Smoker  . Smokeless tobacco: Never Used  Substance Use Topics   . Alcohol use: Never    Frequency: Never  . Drug use: Not on file    Review of Systems  Constitutional: No fever/chills Eyes: No visual changes. ENT: No sore throat. Respiratory: Denies cough Gastrointestinal: Positive for right upper quadrant pain Genitourinary: Negative for dysuria. Musculoskeletal: Negative for back pain. Skin: Negative for rash.    ____________________________________________   PHYSICAL EXAM:  VITAL SIGNS: ED Triage Vitals  Enc Vitals Group     BP 07/25/19 1014 (!) 154/91     Pulse Rate 07/25/19 1014 96     Resp 07/25/19 1014 20     Temp 07/25/19 1016 99.1 F (37.3 C)     Temp Source 07/25/19 1016 Oral     SpO2 07/25/19 1014 99 %     Weight 07/25/19 1014 (!) 350 lb (158.8 kg)     Height 07/25/19 1014 5\' 5"  (1.651 m)     Head Circumference --      Peak Flow --      Pain Score 07/25/19 1014 10     Pain Loc --      Pain Edu? --      Excl. in [place]? --     Constitutional: Alert and oriented. Well appearing and in no acute distress. Eyes: Conjunctivae are normal.  Head: Atraumatic. Nose: No congestion/rhinnorhea. Mouth/Throat: Mucous membranes are moist.   Neck:  supple no lymphadenopathy noted Cardiovascular: Normal rate, regular rhythm. Heart sounds are  normal Respiratory: Normal respiratory effort.  No retractions, lungs c t a  Abd: soft tender in the right upper quadrant, positive Murphy sign, bs normal all 4 quad GU: deferred Musculoskeletal: FROM all extremities, warm and well perfused Neurologic:  Normal speech and language.  Skin:  Skin is warm, dry and intact. No rash noted. Psychiatric: Mood and affect are normal. Speech and behavior are normal.  ____________________________________________   LABS (all labs ordered are listed, but only abnormal results are displayed)  Labs Reviewed  COMPREHENSIVE METABOLIC PANEL - Abnormal; Notable for the following components:      Result Value   Glucose, Bld 112 (*)    Creatinine, Ser 0.55  (*)    Total Protein 8.2 (*)    All other components within normal limits  LIPASE, BLOOD  CBC   ____________________________________________   ____________________________________________  RADIOLOGY  Ultrasound right upper quadrant is negative for cholecystitis, no gallstones or sludge is noted.  ____________________________________________   PROCEDURES  Procedure(s) performed: No  Procedures    ____________________________________________   INITIAL IMPRESSION / ASSESSMENT AND PLAN / ED COURSE  Pertinent labs & imaging results that were available during my care of the patient were reviewed by me and considered in my medical decision making (see chart for details).   Patient is 33 year old male presents emergency department with worsening right upper quadrant pain.  History of gallstones.  See HPI  Physical exam shows patient to be uncomfortable.  Right upper quadrant is tender to palpation.   DDx: Acute cholecystitis, cholelithiasis, abdominal pain  CBC is normal, lipase normal, comprehensive metabolic panel is basically normal.  Ultrasound of the right upper quadrant ordered.  Patient was given a IV with morphine and Zofran.  Explained the findings to the patient.  Explained to him that his gallbladder is normal but he does have a fatty liver.  Explained to him that if he feels like the gallbladder is a nonfunctioning gallbladder he should follow-up with GI.  He should see them by making an appointment.  He can follow-up at [facility] as he states he does not have insurance.  Also encouraged him to follow-up at [facility] clinic that could also help facilitate him seeing a specialist as a primary care provider.  He was encouraged to take melatonin for sleep as he states he cannot sleep due to the pain.  Take Tylenol and ibuprofen.  Dietary changes were discussed.  He was discharged in stable condition.  Is given a work note.   Christian Barrett was evaluated in Emergency Department  on 07/25/2019 for the symptoms described in the history of present illness. He was evaluated in the context of the global COVID-19 pandemic, which necessitated consideration that the patient might be at risk for infection with the SARS-CoV-2 virus that causes COVID-19. Institutional protocols and algorithms that pertain to the evaluation of patients at risk for COVID-19 are in a state of rapid change based on information released by regulatory bodies including the CDC and federal and state organizations. These policies and algorithms were followed during the patient's care in the ED.   As part of my medical decision making, I reviewed the following data within the electronic MEDICAL RECORD NUMBER Nursing notes reviewed and incorporated, Labs reviewed CBC, lipase, metabolic panel are basically normal, Old chart reviewed, Radiograph reviewed ultrasound right upper quadrant is negative, Notes from prior ED visits and [place] Controlled Substance Database  ____________________________________________   FINAL CLINICAL IMPRESSION(S) / ED DIAGNOSES  Final diagnoses:  RUQ pain  Fatty liver  Pain of upper abdomen      NEW MEDICATIONS STARTED DURING THIS VISIT:  Discharge Medication List as of 07/25/2019  1:42 PM       Note:  This document was prepared using Dragon voice recognition software and may include unintentional dictation errors.    Faythe GheeFisher, Joelyn Lover W, PA-C 07/25/19 1407    Sharman CheekStafford, Phillip, MD 07/25/19 1515

## 2019-07-25 NOTE — ED Triage Notes (Signed)
Pt reports was seen here last week for the same and was told it was his gallbladder and he would probably need to have it removed. Pt reports has not followed up with the surgeon because he has no insurance but the pain is still there and worse after he eats and needs it removed.

## 2019-07-25 NOTE — ED Notes (Signed)
See triage note  Presents with right sided abd pain  Was told that he needed to f/u with a surgeon but not able to do so b/c of no insurance  States pain is getting worse esp after eating

## 2019-09-12 ENCOUNTER — Ambulatory Visit: Payer: Self-pay | Admitting: Family Medicine

## 2019-09-14 ENCOUNTER — Encounter: Payer: Self-pay | Admitting: Family Medicine

## 2019-09-14 ENCOUNTER — Other Ambulatory Visit: Payer: Self-pay

## 2019-09-14 ENCOUNTER — Ambulatory Visit (INDEPENDENT_AMBULATORY_CARE_PROVIDER_SITE_OTHER): Payer: Self-pay | Admitting: Family Medicine

## 2019-09-14 VITALS — Ht 65.0 in | Wt 350.0 lb

## 2019-09-14 DIAGNOSIS — Z1159 Encounter for screening for other viral diseases: Secondary | ICD-10-CM

## 2019-09-14 DIAGNOSIS — I10 Essential (primary) hypertension: Secondary | ICD-10-CM

## 2019-09-14 DIAGNOSIS — G4733 Obstructive sleep apnea (adult) (pediatric): Secondary | ICD-10-CM

## 2019-09-14 DIAGNOSIS — R1011 Right upper quadrant pain: Secondary | ICD-10-CM

## 2019-09-14 DIAGNOSIS — J4550 Severe persistent asthma, uncomplicated: Secondary | ICD-10-CM

## 2019-09-14 DIAGNOSIS — K760 Fatty (change of) liver, not elsewhere classified: Secondary | ICD-10-CM

## 2019-09-14 DIAGNOSIS — R079 Chest pain, unspecified: Secondary | ICD-10-CM

## 2019-09-14 DIAGNOSIS — Z113 Encounter for screening for infections with a predominantly sexual mode of transmission: Secondary | ICD-10-CM

## 2019-09-14 DIAGNOSIS — F4310 Post-traumatic stress disorder, unspecified: Secondary | ICD-10-CM

## 2019-09-14 DIAGNOSIS — Z8249 Family history of ischemic heart disease and other diseases of the circulatory system: Secondary | ICD-10-CM

## 2019-09-14 MED ORDER — BREO ELLIPTA 200-25 MCG/INH IN AEPB: 1.0000 | INHALATION_SPRAY | Freq: Every day | RESPIRATORY_TRACT | 3 refills | Status: DC

## 2019-09-14 MED ORDER — PANTOPRAZOLE SODIUM 20 MG PO TBEC: 20.0000 mg | DELAYED_RELEASE_TABLET | Freq: Every day | ORAL | 1 refills | Status: DC

## 2019-09-14 MED ORDER — LISINOPRIL 10 MG PO TABS: 10.0000 mg | ORAL_TABLET | Freq: Every day | ORAL | 1 refills | Status: DC

## 2019-09-14 NOTE — Patient Instructions (Signed)
Fatty Liver Disease  Fatty liver disease occurs when too much fat has built up in your liver cells. Fatty liver disease is also called hepatic steatosis or steatohepatitis. The liver removes harmful substances from your bloodstream and produces fluids that your body needs. It also helps your body use and store energy from the food you eat. In many cases, fatty liver disease does not cause symptoms or problems. It is often diagnosed when tests are being done for other reasons. However, over time, fatty liver can cause inflammation that may lead to more serious liver problems, such as scarring of the liver (cirrhosis) and liver failure. Fatty liver is associated with insulin resistance, increased body fat, high blood pressure (hypertension), and high cholesterol. These are features of metabolic syndrome and increase your risk for stroke, diabetes, and heart disease. What are the causes? This condition may be caused by:  Drinking too much alcohol.  Poor nutrition.  Obesity.  Cushing's syndrome.  Diabetes.  High cholesterol.  Certain drugs.  Poisons.  Some viral infections.  Pregnancy. What increases the risk? You are more likely to develop this condition if you:  Abuse alcohol.  Are overweight.  Have diabetes.  Have hepatitis.  Have a high triglyceride level.  Are pregnant. What are the signs or symptoms? Fatty liver disease often does not cause symptoms. If symptoms do develop, they can include:  Fatigue.  Weakness.  Weight loss.  Confusion.  Abdominal pain.  Nausea and vomiting.  Yellowing of your skin and the white parts of your eyes (jaundice).  Itchy skin. How is this diagnosed? This condition may be diagnosed by:  A physical exam and medical history.  Blood tests.  Imaging tests, such as an ultrasound, CT scan, or MRI.  A liver biopsy. A small sample of liver tissue is removed using a needle. The sample is then looked at under a microscope. How  is this treated? Fatty liver disease is often caused by other health conditions. Treatment for fatty liver may involve medicines and lifestyle changes to manage conditions such as:  Alcoholism.  High cholesterol.  Diabetes.  Being overweight or obese. Follow these instructions at home:   Do not drink alcohol. If you have trouble quitting, ask your health care provider how to safely quit with the help of medicine or a supervised program. This is important to keep your condition from getting worse.  Eat a healthy diet as told by your health care provider. Ask your health care provider about working with a diet and nutrition specialist (dietitian) to develop an eating plan.  Exercise regularly. This can help you lose weight and control your cholesterol and diabetes. Talk to your health care provider about an exercise plan and which activities are best for you.  Take over-the-counter and prescription medicines only as told by your health care provider.  Keep all follow-up visits as told by your health care provider. This is important. Contact a health care provider if: You have trouble controlling your:  Blood sugar. This is especially important if you have diabetes.  Cholesterol.  Drinking of alcohol. Get help right away if:  You have abdominal pain.  You have jaundice.  You have nausea and vomiting.  You vomit blood or material that looks like coffee grounds.  You have stools that are black, tar-like, or bloody. Summary  Fatty liver disease develops when too much fat builds up in the cells of your liver.  Fatty liver disease often causes no symptoms or problems. However, over   time, fatty liver can cause inflammation that may lead to more serious liver problems, such as scarring of the liver (cirrhosis).  You are more likely to develop this condition if you abuse alcohol, are pregnant, are overweight, have diabetes, have hepatitis, or have high triglyceride  levels.  Contact your health care provider if you have trouble controlling your weight, blood sugar, cholesterol, or drinking of alcohol. This information is not intended to replace advice given to you by your health care provider. Make sure you discuss any questions you have with your health care provider. Document Revised: 08/07/2017 Document Reviewed: 06/03/2017 Elsevier Patient Education  2020 [employer]  

## 2019-09-14 NOTE — Progress Notes (Signed)
Name: Christian Barrett   MRN: [id number]    DOB: 03-20-1986   Date:09/14/2019       Progress Note  Subjective  Chief Complaint  Chief Complaint  Patient presents with  . Establish Care  . Ear Fullness  . Abdominal Pain  . Snoring  . Asthma    I connected with  [name]  on 09/14/19 at  7:40 AM EST by a video enabled telemedicine application and verified that I am speaking with the correct person using two identifiers.  I discussed the limitations of evaluation and management by telemedicine and the availability of in person appointments. The patient expressed understanding and agreed to proceed. Staff also discussed with the patient that there may be a patient responsible charge related to this service. Patient Location: Home Provider Location: Home Office Additional Individuals present: None  HPI  Pt presents to establish care and for the following:  Fatty Liver/RUQ Pain: Has been to the ER twice in November 2020 for this issue.  Was told he had fatty liver, dysfunctional gallbladder.  He notes he is having a lot of pain in the RUQ after eating, having diarrhea since October 2020, has "rotten egg" taste in his mouth in the mornings.  Stools are occasionally pale; no blood in stool/dark and tarry stools.  Denies vomiting, endorses intermittent nausea.  Has cut out most fried foods (may have something once every couple of weeks) - trying to bake/air-fry more.  He was prescribed carafate but cannot swallow them because they are so large.  He is still taking protonix.    HTN/Chest Pain/Fam history CAD: He notes that he has been on Lisinopril 10mg  for many years - since adolescence (age 64-13yo).  Last BP was in ER 07/25/2019 and was elevated at 154/91.  He notes that he is worried about heart disease as his family has strong history (many in their 35's).  He states that he does get chest pain in the center of the chest that radiates into bilateral arms, has been going on for at  least 8 years.   Obesity: Walking more at work, otherwise not exercising. Diet as above. 350lbs; 5Ft 5in Body mass index is 58.24 kg/m.  Asthma, OSA: Notes has been worsening over the years, only has albuterol nebulizer at this time.  Has worsening with seasonal changes.  He does not wear CPAP because he panics with the mask.  Was seeing Dr. 37's with [facility] pulmonology was on [place], but lost insurance and has not been able to take.   History of Transient of Loss of Consciousness, PTSD: He had evaluation for transient loss of consciousness in 2018, was then referred to a psychiatrist as it was not thought to be seizure disorder, but a stress disorder.  He was going to vocational rehabilitation, stopped going to the psychiatrist when he lost insurance.  We will refer back to psychiatry today.  Depression screen [facility] 2/9 09/14/2019  Decreased Interest 0  Down, Depressed, Hopeless 0  PHQ - 2 Score 0  Altered sleeping 3  Tired, decreased energy 0  Change in appetite 0  Feeling bad or failure about yourself  0  Trouble concentrating 0  Moving slowly or fidgety/restless 0  Suicidal thoughts 0  PHQ-9 Score 3  Difficult doing work/chores Not difficult at all     There are no problems to display for this patient.   Past Surgical History:  Procedure Laterality Date  . BACK SURGERY  No family history on file.  Social History   Socioeconomic History  . Marital status: Married    Spouse name: Development worker, international aid  . Number of children: 0  . Years of education: Not on file  . Highest education level: Not on file  Occupational History  . Not on file  Tobacco Use  . Smoking status: Never Smoker  . Smokeless tobacco: Never Used  Substance and Sexual Activity  . Alcohol use: Never  . Drug use: Never  . Sexual activity: Never  Other Topics Concern  . Not on file  Social History Narrative  . Not on file   Social Determinants of Health   Financial Resource Strain:   . Difficulty of  Paying Living Expenses: Not on file  Food Insecurity:   . Worried About Charity fundraiser in the Last Year: Not on file  . Ran Out of Food in the Last Year: Not on file  Transportation Needs:   . Lack of Transportation (Medical): Not on file  . Lack of Transportation (Non-Medical): Not on file  Physical Activity:   . Days of Exercise per Week: Not on file  . Minutes of Exercise per Session: Not on file  Stress:   . Feeling of Stress : Not on file  Social Connections:   . Frequency of Communication with Friends and Family: Not on file  . Frequency of Social Gatherings with Friends and Family: Not on file  . Attends Religious Services: Not on file  . Active Member of Clubs or Organizations: Not on file  . Attends Archivist Meetings: Not on file  . Marital Status: Not on file  Intimate Partner Violence:   . Fear of Current or Ex-Partner: Not on file  . Emotionally Abused: Not on file  . Physically Abused: Not on file  . Sexually Abused: Not on file     Current Outpatient Medications:  .  albuterol (PROVENTIL) (2.5 MG/3ML) 0.083% nebulizer solution, Take 3 mLs (2.5 mg total) by nebulization every 6 (six) hours as needed for wheezing or shortness of breath., Disp: 75 mL, Rfl: 12 .  lisinopril (ZESTRIL) 10 MG tablet, Take 1 tablet (10 mg total) by mouth daily., Disp: 30 tablet, Rfl: 2 .  pantoprazole (PROTONIX) 20 MG tablet, Take 1 tablet (20 mg total) by mouth daily., Disp: 30 tablet, Rfl: 2 .  metoCLOPramide (REGLAN) 5 MG tablet, Take 1 tablet (5 mg total) by mouth every 8 (eight) hours as needed for up to 5 days for nausea or vomiting., Disp: 15 tablet, Rfl: 0 .  sucralfate (CARAFATE) 1 g tablet, Take 1 tablet (1 g total) by mouth 4 (four) times daily., Disp: 120 tablet, Rfl: 0  Allergies  Allergen Reactions  . Lamotrigine Rash    And welts And welts   . Tramadol Nausea And Vomiting and Other (See Comments)    Other reaction(s): Unknown     I personally reviewed  active problem list, medication list, allergies, health maintenance, notes from last encounter, lab results with the patient/caregiver today.   ROS  Ten systems reviewed and is negative except as mentioned in HPI  Objective  Virtual encounter, vitals not obtained.  There is no height or weight on file to calculate BMI.  Physical Exam Constitutional: Patient appears well-developed and well-nourished. No distress.  HENT: Head: Normocephalic and atraumatic.  Neck: Normal range of motion. Pulmonary/Chest: Effort normal. No respiratory distress. Speaking in complete sentences Neurological: Pt is alert and oriented to person, place,  and time. Speech is normal Psychiatric: Patient has a somewhat anxious mood and affect. behavior is normal. Judgment and thought content normal.  No results found for this or any previous visit (from the past 72 hour(s)).  PHQ2/9: Depression screen PHQ 2/9 09/14/2019  Decreased Interest 0  Down, Depressed, Hopeless 0  PHQ - 2 Score 0  Altered sleeping 3  Tired, decreased energy 0  Change in appetite 0  Feeling bad or failure about yourself  0  Trouble concentrating 0  Moving slowly or fidgety/restless 0  Suicidal thoughts 0  PHQ-9 Score 3  Difficult doing work/chores Not difficult at all   PHQ-2/9 Result is negative.    Fall Risk: Fall Risk  09/14/2019  Falls in the past year? 0  Number falls in past yr: 0  Injury with Fall? 0  Follow up Falls evaluation completed    Assessment & Plan  1. Fatty liver -  Lifestyle management discussed in significant detail with the patient today - Ambulatory referral to Gastroenterology - CBC with Differential/Platelet  2. RUQ pain - pantoprazole (PROTONIX) 20 MG tablet; Take 1 tablet (20 mg total) by mouth daily.  Dispense: 90 tablet; Refill: 1 - Ambulatory referral to Gastroenterology - CBC with Differential/Platelet  3. Chest pain, unspecified type - Discussed red flags and when to present to ER. -  Ambulatory referral to Cardiology - CBC with Differential/Platelet - Lipid panel - Comprehensive metabolic panel  4. Essential hypertension - lisinopril (ZESTRIL) 10 MG tablet; Take 1 tablet (10 mg total) by mouth daily.  Dispense: 90 tablet; Refill: 1 - Ambulatory referral to Cardiology - CBC with Differential/Platelet - Lipid panel - Comprehensive metabolic panel  5. Family history of early CAD - Ambulatory referral to Cardiology - Lipid panel - Comprehensive metabolic panel  6. Severe persistent asthma without complication - CBC with Differential/Platelet - fluticasone furoate-vilanterol (BREO ELLIPTA) 200-25 MCG/INH AEPB; Inhale 1 puff into the lungs daily.  Dispense: 60 each; Refill: 3  7. OSA (obstructive sleep apnea) - Ambulatory referral to Pulmonology - CBC with Differential/Platelet  8. Morbid obesity (HCC) - Discussed importance of 150 minutes of physical activity weekly, eat two servings of fish weekly, eat one serving of tree nuts ( cashews, pistachios, pecans, almonds.Marland Kitchen) every other day, eat 6 servings of fruit/vegetables daily and drink plenty of water and avoid sweet beverages.  - CBC with Differential/Platelet - Hemoglobin A1c - TSH  9. Routine screening for STI (sexually transmitted infection) - HIV Antibody (routine testing w rflx) - RPR - GC/Chlamydia Probe Amp  10. Encounter for hepatitis C screening test for low risk patient - Hepatitis C antibody  11. PTSD (post-traumatic stress disorder) - Ambulatory referral to Psychiatry  1 week follow up to discuss additional non-urgent concerns and to review lab work; must be in person to obtain VS.  I discussed the assessment and treatment plan with the patient. The patient was provided an opportunity to ask questions and all were answered. The patient agreed with the plan and demonstrated an understanding of the instructions.  The patient was advised to call back or seek an in-person evaluation if the  symptoms worsen or if the condition fails to improve as anticipated.  I provided 36 minutes of non-face-to-face time during this encounter.

## 2019-09-22 ENCOUNTER — Encounter: Payer: Self-pay | Admitting: *Deleted

## 2019-09-22 NOTE — Progress Notes (Signed)
New Outpatient Visit Date: 09/23/2019  Referring Provider: Doren Custard, FNP [address] [place],  [place] [address]   Chief Complaint: Chest pain  HPI:  Christian Barrett is a 34 y.o. male who is being seen today for the evaluation of chest pain in the setting of family history of premature ASCVD at the request of Christian Barrett. He has a history of hypertension, asthma, hepatic steatosis, and morbid obesity. Christian Barrett reports at least 8 years of chest discomfort and shortness of breath with activity.  However, these symptoms have progressed over the last few years to the point where he now has symptoms with minimal activity.  He describes his chest discomfort as a tightness, as if his ribs were crossing.  He often takes a show to help his pain ease off.  It often comes on with exertion, though has been present at rest from time to time as well.  Symptoms currently happen about once a week.  Christian Barrett reports that he saw a cardiologist about 13 years ago for chest pain but did not undergo further evaluation.  Christian Barrett has also noticed numbness in his arms and legs, though it is unclear if this is consistently related to his chest pain.  He also has chronic edema below the knees.  His heart rate always seems to be elevated as well, and he is sometimes aware of his heart racing.  He has not passed out.  His weight has been stable for years, though he recently has felt more bloated.  Christian Barrett reports that he has had significant hypertension since his teens.  He was also morbidly obese during childhood.  His mother struggeled with chest and back pain in her early 31's and reportedly had a heart attack before needing to undergo bypass surgery at age 83.  --------------------------------------------------------------------------------------------------  Cardiovascular History & Procedures: Cardiovascular Problems:  Chest pain and shortness of breath  Risk Factors:  Hypertension,  family history, and morbid obesity  Cath/PCI:  None  CV Surgery:  None  EP Procedures and Devices:  None  Non-Invasive Evaluation(s):  None  Recent CV Pertinent Labs: Lab Results  Component Value Date   K 4.1 07/25/2019   BUN 17 07/25/2019   CREATININE 0.55 (L) 07/25/2019    --------------------------------------------------------------------------------------------------  Past Medical History:  Diagnosis Date  . Asthma   . Hypertension   . Renal disorder     Past Surgical History:  Procedure Laterality Date  . BACK SURGERY      Current Meds  Medication Sig  . albuterol (PROVENTIL) (2.5 MG/3ML) 0.083% nebulizer solution Take 3 mLs (2.5 mg total) by nebulization every 6 (six) hours as needed for wheezing or shortness of breath.  . carbamide peroxide (DEBROX) 6.5 % OTIC solution Place 5 drops into both ears 2 (two) times daily.  . fluticasone (FLONASE) 50 MCG/ACT nasal spray Place 2 sprays into both nostrils daily.  . fluticasone furoate-vilanterol (BREO ELLIPTA) 200-25 MCG/INH AEPB Inhale 1 puff into the lungs daily.  Marland Kitchen levocetirizine (XYZAL) 5 MG tablet Take 1 tablet (5 mg total) by mouth every evening.  Marland Kitchen lisinopril (ZESTRIL) 10 MG tablet Take 1 tablet (10 mg total) by mouth daily.  . pantoprazole (PROTONIX) 20 MG tablet Take 1 tablet (20 mg total) by mouth daily.    Allergies: Lamotrigine and Tramadol  Social History   Tobacco Use  . Smoking status: Never Smoker  . Smokeless tobacco: Never Used  Substance Use Topics  . Alcohol use: Never  .  Drug use: Never    History reviewed. No pertinent family history.  Review of Systems: A 12-system review of systems was performed and was negative except as noted in the HPI.  --------------------------------------------------------------------------------------------------  Physical Exam: BP 132/78 (BP Location: Right Arm, Patient Position: Sitting, Cuff Size: Normal)   Pulse 88   Ht 5\' 5"  (1.651 m)   Wt  (!) 359 lb (162.8 kg)   SpO2 98%   BMI 59.74 kg/m   General:  NAD> HEENT: No conjunctival pallor or scleral icterus. Facemask in place. Neck: Supple without lymphadenopathy, thyromegaly.  Unable to assess JVP due to body habitus.  No carotid bruit. Lungs: Normal work of breathing. Clear to auscultation bilaterally without wheezes or crackles. Heart: Regular rate and rhythm without murmurs, rubs, or gallops. Unable to assess PMI due to body habitus. Abd: Bowel sounds present. Obese.  NT/ND.  Unable to assess HSM due to body habitus. Ext: 1+ non-pitting pretibial edema bilaterally. Radial, PT, and DP pulses are 2+ bilaterally Skin: Warm and dry without rash. Neuro: CNIII-XII intact. Strength and fine-touch sensation intact in upper and lower extremities bilaterally. Psych: Normal mood and affect.  EKG:  NSR with incomplete RBBB.  Lab Results  Component Value Date   WBC 10.0 07/25/2019   HGB 14.0 07/25/2019   HCT 42.0 07/25/2019   MCV 80.0 07/25/2019   PLT 304 07/25/2019    Lab Results  Component Value Date   NA 138 07/25/2019   K 4.1 07/25/2019   CL 103 07/25/2019   CO2 24 07/25/2019   BUN 17 07/25/2019   CREATININE 0.55 (L) 07/25/2019   GLUCOSE 112 (H) 07/25/2019   ALT 33 07/25/2019    No results found for: CHOL, HDL, LDLCALC, LDLDIRECT, TRIG, CHOLHDL   --------------------------------------------------------------------------------------------------  ASSESSMENT AND PLAN: Chest pain and shortness of breath: Symptoms have been present for at least 8 years but seem to have been gradually progressing to the point of having intermittent chest discomfort with minimal activity and at rest.  EKG today does not show any ischemic changes.  Though Christian Barrett is young, his morbid obesity, long history of hypertension (reportedly diagnosed over 20 years ago), and family history of CAD in his mother in hear early 10's, I feel that underlying CAD and cardiomyopathy need to be  excluded.  I am concerned that his body habitus will limit the accuracy of non-invasive testing.  Furthermore, the progression of symptoms, which now occur at times at rest, warrant expedited workup.  We have therefore agreed to proceed with right and left heart catheterization with possible PCI.  I have reviewed the risks, indications, and alternatives to cardiac catheterization, possible angioplasty, and stenting with the patient. Risks include but are not limited to bleeding, infection, vascular injury, stroke, myocardial infection, arrhythmia, kidney injury, radiation-related injury in the case of prolonged fluoroscopy use, emergency cardiac surgery, and death. The patient understands the risks of serious complication is 1-2 in 9528 with diagnostic cardiac cath and 1-2% or less with angioplasty/stenting.  In the meantime, I will add aspirin 81 mg daily and nitroglycerin 0.4 mg Q5 min as needed for chest pain.  I advised Christian Barrett to seek immediate medical attention should he have recurrent chest pain that does not resolve promptly with rest or SL nitroglycerin.  Hypertension: BP upper normal today.  I will defer medication changes at this time.  Morbid obesity: Weight loss encouraged through diet and exercise.  Consultation with a bariatric specialist may be helpful in the future following  aforementioned cardiac workup.  Follow-up: Return to clinic ~2 weeks after catheterization.  Yvonne Kendall, MD 09/23/2019 4:32 PM

## 2019-09-22 NOTE — H&P (View-Only) (Signed)
 New Outpatient Visit Date: 09/23/2019  Referring Provider: Boyce, Emily E, FNP [address] [place],  [place] [address]   Chief Complaint: Chest pain  HPI:  Christian Barrett is a 33 y.o. male who is being seen today for the evaluation of chest pain in the setting of family history of premature ASCVD at the request of [name]. He has a history of hypertension, asthma, hepatic steatosis, and morbid obesity. [name] reports at least 8 years of chest discomfort and shortness of breath with activity.  However, these symptoms have progressed over the last few years to the point where he now has symptoms with minimal activity.  He describes his chest discomfort as a tightness, as if his ribs were crossing.  He often takes a show to help his pain ease off.  It often comes on with exertion, though has been present at rest from time to time as well.  Symptoms currently happen about once a week.  [name] reports that he saw a cardiologist about 13 years ago for chest pain but did not undergo further evaluation.  [name] has also noticed numbness in his arms and legs, though it is unclear if this is consistently related to his chest pain.  He also has chronic edema below the knees.  His heart rate always seems to be elevated as well, and he is sometimes aware of his heart racing.  He has not passed out.  His weight has been stable for years, though he recently has felt more bloated.  [name] reports that he has had significant hypertension since his teens.  He was also morbidly obese during childhood.  His mother struggeled with chest and back pain in her early 30's and reportedly had a heart attack before needing to undergo bypass surgery at age 33.  --------------------------------------------------------------------------------------------------  Cardiovascular History & Procedures: Cardiovascular Problems:  Chest pain and shortness of breath  Risk Factors:  Hypertension,  family history, and morbid obesity  Cath/PCI:  None  CV Surgery:  None  EP Procedures and Devices:  None  Non-Invasive Evaluation(s):  None  Recent CV Pertinent Labs: Lab Results  Component Value Date   K 4.1 07/25/2019   BUN 17 07/25/2019   CREATININE 0.55 (L) 07/25/2019    --------------------------------------------------------------------------------------------------  Past Medical History:  Diagnosis Date  . Asthma   . Hypertension   . Renal disorder     Past Surgical History:  Procedure Laterality Date  . BACK SURGERY      Current Meds  Medication Sig  . albuterol (PROVENTIL) (2.5 MG/3ML) 0.083% nebulizer solution Take 3 mLs (2.5 mg total) by nebulization every 6 (six) hours as needed for wheezing or shortness of breath.  . carbamide peroxide (DEBROX) 6.5 % OTIC solution Place 5 drops into both ears 2 (two) times daily.  . fluticasone (FLONASE) 50 MCG/ACT nasal spray Place 2 sprays into both nostrils daily.  . fluticasone furoate-vilanterol (BREO ELLIPTA) 200-25 MCG/INH AEPB Inhale 1 puff into the lungs daily.  . levocetirizine (XYZAL) 5 MG tablet Take 1 tablet (5 mg total) by mouth every evening.  . lisinopril (ZESTRIL) 10 MG tablet Take 1 tablet (10 mg total) by mouth daily.  . pantoprazole (PROTONIX) 20 MG tablet Take 1 tablet (20 mg total) by mouth daily.    Allergies: Lamotrigine and Tramadol  Social History   Tobacco Use  . Smoking status: Never Smoker  . Smokeless tobacco: Never Used  Substance Use Topics  . Alcohol use: Never  .   Drug use: Never    History reviewed. No pertinent family history.  Review of Systems: A 12-system review of systems was performed and was negative except as noted in the HPI.  --------------------------------------------------------------------------------------------------  Physical Exam: BP 132/78 (BP Location: Right Arm, Patient Position: Sitting, Cuff Size: Normal)   Pulse 88   Ht 5\' 5"  (1.651 m)   Wt  (!) 359 lb (162.8 kg)   SpO2 98%   BMI 59.74 kg/m   General:  NAD> HEENT: No conjunctival pallor or scleral icterus. Facemask in place. Neck: Supple without lymphadenopathy, thyromegaly.  Unable to assess JVP due to body habitus.  No carotid bruit. Lungs: Normal work of breathing. Clear to auscultation bilaterally without wheezes or crackles. Heart: Regular rate and rhythm without murmurs, rubs, or gallops. Unable to assess PMI due to body habitus. Abd: Bowel sounds present. Obese.  NT/ND.  Unable to assess HSM due to body habitus. Ext: 1+ non-pitting pretibial edema bilaterally. Radial, PT, and DP pulses are 2+ bilaterally Skin: Warm and dry without rash. Neuro: CNIII-XII intact. Strength and fine-touch sensation intact in upper and lower extremities bilaterally. Psych: Normal mood and affect.  EKG:  NSR with incomplete RBBB.  Lab Results  Component Value Date   WBC 10.0 07/25/2019   HGB 14.0 07/25/2019   HCT 42.0 07/25/2019   MCV 80.0 07/25/2019   PLT 304 07/25/2019    Lab Results  Component Value Date   NA 138 07/25/2019   K 4.1 07/25/2019   CL 103 07/25/2019   CO2 24 07/25/2019   BUN 17 07/25/2019   CREATININE 0.55 (L) 07/25/2019   GLUCOSE 112 (H) 07/25/2019   ALT 33 07/25/2019    No results found for: CHOL, HDL, LDLCALC, LDLDIRECT, TRIG, CHOLHDL   --------------------------------------------------------------------------------------------------  ASSESSMENT AND PLAN: Chest pain and shortness of breath: Symptoms have been present for at least 8 years but seem to have been gradually progressing to the point of having intermittent chest discomfort with minimal activity and at rest.  EKG today does not show any ischemic changes.  Though [name] is young, his morbid obesity, long history of hypertension (reportedly diagnosed over 20 years ago), and family history of CAD in his mother in hear early 10's, I feel that underlying CAD and cardiomyopathy need to be  excluded.  I am concerned that his body habitus will limit the accuracy of non-invasive testing.  Furthermore, the progression of symptoms, which now occur at times at rest, warrant expedited workup.  We have therefore agreed to proceed with right and left heart catheterization with possible PCI.  I have reviewed the risks, indications, and alternatives to cardiac catheterization, possible angioplasty, and stenting with the patient. Risks include but are not limited to bleeding, infection, vascular injury, stroke, myocardial infection, arrhythmia, kidney injury, radiation-related injury in the case of prolonged fluoroscopy use, emergency cardiac surgery, and death. The patient understands the risks of serious complication is 1-2 in 9528 with diagnostic cardiac cath and 1-2% or less with angioplasty/stenting.  In the meantime, I will add aspirin 81 mg daily and nitroglycerin 0.4 mg Q5 min as needed for chest pain.  I advised Mr. Gita Kudo to seek immediate medical attention should he have recurrent chest pain that does not resolve promptly with rest or SL nitroglycerin.  Hypertension: BP upper normal today.  I will defer medication changes at this time.  Morbid obesity: Weight loss encouraged through diet and exercise.  Consultation with a bariatric specialist may be helpful in the future following  aforementioned cardiac workup.  Follow-up: Return to clinic ~2 weeks after catheterization.  Yvonne Kendall, MD 09/23/2019 4:32 PM

## 2019-09-23 ENCOUNTER — Encounter: Payer: Self-pay | Admitting: Internal Medicine

## 2019-09-23 ENCOUNTER — Encounter: Payer: Self-pay | Admitting: Family Medicine

## 2019-09-23 ENCOUNTER — Ambulatory Visit (INDEPENDENT_AMBULATORY_CARE_PROVIDER_SITE_OTHER): Payer: BC Managed Care – PPO | Admitting: Internal Medicine

## 2019-09-23 ENCOUNTER — Ambulatory Visit (INDEPENDENT_AMBULATORY_CARE_PROVIDER_SITE_OTHER): Payer: BC Managed Care – PPO | Admitting: Family Medicine

## 2019-09-23 ENCOUNTER — Other Ambulatory Visit: Payer: Self-pay

## 2019-09-23 VITALS — BP 132/78 | HR 88 | Ht 65.0 in | Wt 359.0 lb

## 2019-09-23 DIAGNOSIS — R0602 Shortness of breath: Secondary | ICD-10-CM

## 2019-09-23 DIAGNOSIS — I10 Essential (primary) hypertension: Secondary | ICD-10-CM

## 2019-09-23 DIAGNOSIS — J4550 Severe persistent asthma, uncomplicated: Secondary | ICD-10-CM

## 2019-09-23 DIAGNOSIS — G4733 Obstructive sleep apnea (adult) (pediatric): Secondary | ICD-10-CM | POA: Diagnosis not present

## 2019-09-23 DIAGNOSIS — Z23 Encounter for immunization: Secondary | ICD-10-CM

## 2019-09-23 DIAGNOSIS — F4310 Post-traumatic stress disorder, unspecified: Secondary | ICD-10-CM

## 2019-09-23 DIAGNOSIS — J310 Chronic rhinitis: Secondary | ICD-10-CM

## 2019-09-23 DIAGNOSIS — Z8249 Family history of ischemic heart disease and other diseases of the circulatory system: Secondary | ICD-10-CM | POA: Insufficient documentation

## 2019-09-23 DIAGNOSIS — R079 Chest pain, unspecified: Secondary | ICD-10-CM | POA: Insufficient documentation

## 2019-09-23 DIAGNOSIS — K760 Fatty (change of) liver, not elsewhere classified: Secondary | ICD-10-CM | POA: Insufficient documentation

## 2019-09-23 DIAGNOSIS — H6993 Unspecified Eustachian tube disorder, bilateral: Secondary | ICD-10-CM

## 2019-09-23 DIAGNOSIS — H6983 Other specified disorders of Eustachian tube, bilateral: Secondary | ICD-10-CM

## 2019-09-23 MED ORDER — FLUTICASONE PROPIONATE 50 MCG/ACT NA SUSP: 2.0000 | Freq: Every day | NASAL | 6 refills | Status: DC

## 2019-09-23 MED ORDER — DEBROX 6.5 % OT SOLN: 5.0000 [drp] | Freq: Two times a day (BID) | OTIC | 3 refills | Status: DC

## 2019-09-23 MED ORDER — LEVOCETIRIZINE DIHYDROCHLORIDE 5 MG PO TABS: 5.0000 mg | ORAL_TABLET | Freq: Every evening | ORAL | 1 refills | Status: DC

## 2019-09-23 MED ORDER — NITROGLYCERIN 0.4 MG SL SUBL: 0.4000 mg | SUBLINGUAL_TABLET | SUBLINGUAL | 3 refills | Status: DC | PRN

## 2019-09-23 NOTE — Progress Notes (Signed)
Name: Christian Barrett   MRN: [id number]    DOB: 1986/01/31   Date:09/23/2019       Progress Note  Subjective  Chief Complaint  Chief Complaint  Patient presents with  . Ear Pain    says he can not hear that well out of them  . Follow-up    discuss weight, pulse and BP medication    HPI  Pt presents today for first in-person evaluation.  He established care virtually with our clinic about a week ago and had multiple concerns, so he comes today to address the more non-urgent concerns.  We did order labs at previous visit which he will obtain today.   Ear Pain: Has seen ENT in the past, had tubes in ears as a child, states he has a lot of scarring in his ears from this.  Had audiology screening and was told that they could not help him secondary to scarring.  He feels like his hearing has declined bilaterally.  He does have occasional tinnitus, has sinus congestion.    Obesity:  Discussed diet and exercise in detail and possible medication options. Discussed gastric bypass and medical weight management as well - would like to do medical weight management.    Transient LOC: Was seeing Dr. Orlin Hilding with [facility] back in 2018 for LOC that was apparently thought to be stress-induced and not epileptic in nature.  He reports having his license revoked during this time, neurology advised they could not approve his license reinstatement, referred to psychiatry, who also did not approve his license reinstatement at that time.  I did refer to psychiatry at his last visit.  Last episode of LOC was in 2018.  I did advise that I will not override specialty opinion on his license.   Patient Active Problem List   Diagnosis Date Noted  . Essential hypertension 09/23/2019  . Chest pain 09/23/2019  . Fatty liver 09/23/2019  . Family history of early CAD 09/23/2019  . Severe persistent asthma without complication 09/23/2019  . OSA (obstructive sleep apnea) 09/23/2019  . Morbid obesity (HCC)  09/23/2019  . PTSD (post-traumatic stress disorder) 09/23/2019    Past Surgical History:  Procedure Laterality Date  . BACK SURGERY      History reviewed. No pertinent family history.  Social History   Socioeconomic History  . Marital status: Married    Spouse name: Printmaker  . Number of children: 0  . Years of education: Not on file  . Highest education level: Not on file  Occupational History  . Not on file  Tobacco Use  . Smoking status: Never Smoker  . Smokeless tobacco: Never Used  Substance and Sexual Activity  . Alcohol use: Never  . Drug use: Never  . Sexual activity: Never  Other Topics Concern  . Not on file  Social History Narrative  . Not on file   Social Determinants of Health   Financial Resource Strain:   . Difficulty of Paying Living Expenses: Not on file  Food Insecurity:   . Worried About Programme researcher, broadcasting/film/video in the Last Year: Not on file  . Ran Out of Food in the Last Year: Not on file  Transportation Needs:   . Lack of Transportation (Medical): Not on file  . Lack of Transportation (Non-Medical): Not on file  Physical Activity:   . Days of Exercise per Week: Not on file  . Minutes of Exercise per Session: Not on file  Stress:   .  Feeling of Stress : Not on file  Social Connections:   . Frequency of Communication with Friends and Family: Not on file  . Frequency of Social Gatherings with Friends and Family: Not on file  . Attends Religious Services: Not on file  . Active Member of Clubs or Organizations: Not on file  . Attends Archivist Meetings: Not on file  . Marital Status: Not on file  Intimate Partner Violence:   . Fear of Current or Ex-Partner: Not on file  . Emotionally Abused: Not on file  . Physically Abused: Not on file  . Sexually Abused: Not on file     Current Outpatient Medications:  .  albuterol (PROVENTIL) (2.5 MG/3ML) 0.083% nebulizer solution, Take 3 mLs (2.5 mg total) by nebulization every 6 (six)  hours as needed for wheezing or shortness of breath., Disp: 75 mL, Rfl: 12 .  fluticasone furoate-vilanterol (BREO ELLIPTA) 200-25 MCG/INH AEPB, Inhale 1 puff into the lungs daily., Disp: 60 each, Rfl: 3 .  lisinopril (ZESTRIL) 10 MG tablet, Take 1 tablet (10 mg total) by mouth daily., Disp: 90 tablet, Rfl: 1 .  pantoprazole (PROTONIX) 20 MG tablet, Take 1 tablet (20 mg total) by mouth daily., Disp: 90 tablet, Rfl: 1 .  carbamide peroxide (DEBROX) 6.5 % OTIC solution, Place 5 drops into both ears 2 (two) times daily., Disp: 15 mL, Rfl: 3 .  fluticasone (FLONASE) 50 MCG/ACT nasal spray, Place 2 sprays into both nostrils daily., Disp: 16 g, Rfl: 6 .  levocetirizine (XYZAL) 5 MG tablet, Take 1 tablet (5 mg total) by mouth every evening., Disp: 90 tablet, Rfl: 1  Allergies  Allergen Reactions  . Lamotrigine Rash    And welts And welts   . Tramadol Nausea And Vomiting and Other (See Comments)    Other reaction(s): Unknown     I personally reviewed active problem list, medication list, allergies, family history, social history, health maintenance, notes from last encounter with the patient/caregiver today.   ROS  Ten systems reviewed and is negative except as mentioned in HPI  Objective  Vitals:   09/23/19 1136  BP: 118/66  Pulse: (!) 107  Resp: 20  Temp: (!) 97.3 F (36.3 C)  TempSrc: Temporal  Weight: (!) 356 lb 1.6 oz (161.5 kg)  Height: 5\' 5"  (1.651 m)    Body mass index is 59.26 kg/m.  Physical Exam  Constitutional: Patient appears well-developed and well nourished - Morbidly obese. No distress.  HENT: Head: Normocephalic and atraumatic. Ears: bilateral TMs with no erythema or effusion, though there is scarring L>R, there is moderate cerumen without impaction bilaterally; Nose: Nose normal. Mouth/Throat: Oropharynx is clear and moist. No oropharyngeal exudate or tonsillar swelling.  Eyes: Conjunctivae and EOM are normal. No scleral icterus.  Pupils are equal, round, and  reactive to light.  Neck: Normal range of motion. Neck supple. No JVD present. No thyromegaly present.  Cardiovascular: Normal rate, regular rhythm and normal heart sounds.  No murmur heard. No BLE edema. Pulmonary/Chest: Effort normal and breath sounds normal. No respiratory distress. Abdominal: Soft. Bowel sounds are normal, no distension. There is mild RUQ tenderness, no rebound, negative murphy's. No masses. Musculoskeletal: Normal range of motion, no joint effusions. No gross deformities Neurological: Pt is alert and oriented to person, place, and time. No cranial nerve deficit. Coordination, balance, strength, speech and gait are normal.  Skin: Skin is warm and dry. No rash noted. No erythema.  Psychiatric: Patient has a normal mood and affect. behavior  is normal. Judgment and thought content normal.  No results found for this or any previous visit (from the past 72 hour(s)).  PHQ2/9: Depression screen [facility] 2/9 09/23/2019 09/14/2019  Decreased Interest 0 0  Down, Depressed, Hopeless 0 0  PHQ - 2 Score 0 0  Altered sleeping 0 3  Tired, decreased energy 3 0  Change in appetite 3 0  Feeling bad or failure about yourself  0 0  Trouble concentrating 0 0  Moving slowly or fidgety/restless 0 0  Suicidal thoughts 0 0  PHQ-9 Score 6 3  Difficult doing work/chores Not difficult at all Not difficult at all   PHQ-2/9 Result is positive.    Fall Risk: Fall Risk  09/23/2019 09/14/2019  Falls in the past year? 0 0  Number falls in past yr: 0 0  Injury with Fall? 0 0  Follow up - Falls evaluation completed     Functional Status Survey: Is the patient deaf or have difficulty hearing?: Yes Does the patient have difficulty seeing, even when wearing glasses/contacts?: No Does the patient have difficulty concentrating, remembering, or making decisions?: No Does the patient have difficulty walking or climbing stairs?: No Does the patient have difficulty dressing or bathing?: No Does the patient  have difficulty doing errands alone such as visiting a doctor's office or shopping?: No   Assessment & Plan  1. Morbid obesity (HCC) - Discussed with the patient the risk posed by an increased BMI. Discussed importance of portion control, calorie counting and at least 150 minutes of physical activity weekly. Avoid sweet beverages and drink more water. Eat at least 6 servings of fruit and vegetables daily  - Amb Ref to Medical Weight Management  2. Severe persistent asthma without complication - levocetirizine (XYZAL) 5 MG tablet; Take 1 tablet (5 mg total) by mouth every evening.  Dispense: 90 tablet; Refill: 1  3. OSA (obstructive sleep apnea) - Will follow up with referrals regarding this  4. PTSD (post-traumatic stress disorder) - Psychiatry referral is in place.  5. Eustachian tube dysfunction, bilateral - carbamide peroxide (DEBROX) 6.5 % OTIC solution; Place 5 drops into both ears 2 (two) times daily.  Dispense: 15 mL; Refill: 3 - fluticasone (FLONASE) 50 MCG/ACT nasal spray; Place 2 sprays into both nostrils daily.  Dispense: 16 g; Refill: 6 - levocetirizine (XYZAL) 5 MG tablet; Take 1 tablet (5 mg total) by mouth every evening.  Dispense: 90 tablet; Refill: 1 - ENT referral if not resolving.   6. Chronic rhinitis - Meds as above.

## 2019-09-23 NOTE — Patient Instructions (Addendum)
Medication Instructions:  Your physician has recommended you make the following change in your medication:  1- START Aspirin 81 mg by mouth once a day. 2- Nitroglycerin as needed for chest pain - Dissolve 1 tablet (0.4 mg) under your tongue every 5 minutes as needed for chest pain. Do not take more than 3 doses. If chest pain does not resolve, then call 911 or go to the Emergency Room.    *If you need a refill on your cardiac medications before your next appointment, please call your pharmacy*  Lab Work: 1- Your physician recommends that you return for lab work in: prior to heart cath.  - You will need a CBC, BMET and fasting LIPID.   - please have them fax to [phone].  - If they are unable to them, please let us know.  2- COVID PRE- TEST: You will need a COVID TEST prior to the procedure:  LOCATION: [facility]ru Testing site.  DATE/TIME:  [facility], September 30, 2019  If you have labs (blood work) drawn today and your tests are completely normal, you will receive your results only by: Marland Kitchen MyChart Message (if you have MyChart) OR . A paper copy in the mail If you have any lab test that is abnormal or we need to change your treatment, we will call you to review the results.  Testing/Procedures: Your physician has requested that you have a cardiac catheterization. Cardiac catheterization is used to diagnose and/or treat various heart conditions. Doctors may recommend this procedure for a number of different reasons. The most common reason is to evaluate chest pain. Chest pain can be a symptom of coronary artery disease (CAD), and cardiac catheterization can show whether plaque is narrowing or blocking your heart's arteries. This procedure is also used to evaluate the valves, as well as measure the blood flow and oxygen levels in different parts of your heart. For further information please visit [email or web address] Please follow instruction sheet, as given.  You are  scheduled for a RIGHT AND LEFT Cardiac Catheterization on Tuesday, January 26 with Dr. Cristal Deer End.  1. Please arrive at the [facility] (Main Entrance A) at [facility]: [address] [place], [place] [address] at 6:30 AM (This time is two hours before your procedure to ensure your preparation). Free valet parking service is available.   Special note: Every effort is made to have your procedure done on time. Please understand that emergencies sometimes delay scheduled procedures.  2. Diet: Do not eat solid foods after midnight.  The patient may have clear liquids until 5am upon the day of the procedure.  3. Labs: You will need to have blood drawn on. You will need to be fasting for the lipid panel.  4. Medication instructions in preparation for your procedure:   Contrast Allergy: No   On the morning of your procedure, take your Aspirin and any morning medicines NOT listed above.  You may use sips of water.  5. Plan for one night stay--bring personal belongings. 6. Bring a current list of your medications and current insurance cards. 7. You MUST have a responsible person to drive you home. 8. Someone MUST be with you the first 24 hours after you arrive home or your discharge will be delayed. 9. Please wear clothes that are easy to get on and off and wear slip-on shoes.  Thank you for allowing [place] to care for you!   -- [place] [facility]   Follow-Up: At  [facility], you and your health needs are our priority.  As part of our continuing mission to provide you with exceptional heart care, we have created designated Provider Care Teams.  These Care Teams include your primary Cardiologist (physician) and Advanced Practice Providers (APPs -  Physician Assistants and Nurse Practitioners) who all work together to provide you with the care you need, when you need it.  Your next appointment:   2 week(s) after cath  The format for your next appointment:    In Person  Provider:    You may see DR Harrell Gave END or one of the following Advanced Practice Providers on your designated Care Team:    Murray Hodgkins, NP  Christell Faith, PA-C  Marrianne Mood, PA-C    Coronary Angiogram With Stent Coronary angiogram with stent placement is a procedure to widen or open a narrow blood vessel of the heart (coronary artery). Arteries may become blocked by cholesterol buildup (plaques) in the lining of the artery wall. When a coronary artery becomes partially blocked, blood flow to that area decreases. This may lead to chest pain or a heart attack (myocardial infarction). A stent is a small piece of metal that looks like mesh or spring. Stent placement may be done as treatment after a heart attack, or to prevent a heart attack if a blocked artery is found by a coronary angiogram. Let your health care provider know about:  Any allergies you have, including allergies to medicines or contrast dye.  All medicines you are taking, including vitamins, herbs, eye drops, creams, and over-the-counter medicines.  Any problems you or family members have had with anesthetic medicines.  Any blood disorders you have.  Any surgeries you have had.  Any medical conditions you have, including kidney problems or kidney failure.  Whether you are pregnant or may be pregnant.  Whether you are breastfeeding. What are the risks? Generally, this is a safe procedure. However, serious problems may occur, including:  Damage to nearby structures or organs, such as the heart, blood vessels, or kidneys.  A return of blockage.  Bleeding, infection, or bruising at the insertion site.  A collection of blood under the skin (hematoma) at the insertion site.  A blood clot in another part of the body.  Allergic reaction to medicines or dyes.  Bleeding into the abdomen (retroperitoneal bleeding).  Stroke (rare).  Heart attack (rare). What happens before the  procedure? Staying hydrated Follow instructions from your health care provider about hydration, which may include:  Up to 2 hours before the procedure - you may continue to drink clear liquids, such as water, clear fruit juice, black coffee, and plain tea.  Eating and drinking restrictions Follow instructions from your health care provider about eating and drinking, which may include:  8 hours before the procedure - stop eating heavy meals or foods, such as meat, fried foods, or fatty foods.  6 hours before the procedure - stop eating light meals or foods, such as toast or cereal.  2 hours before the procedure - stop drinking clear liquids. Medicines Ask your health care provider about:  Changing or stopping your regular medicines. This is especially important if you are taking diabetes medicines or blood thinners.  Taking medicines such as aspirin and ibuprofen. These medicines can thin your blood. Do not take these medicines unless your health care provider tells you to take them. ? Generally, aspirin is recommended before a thin tube, called a catheter, is passed through a blood vessel  and inserted into the heart (cardiac catheterization).  Taking over-the-counter medicines, vitamins, herbs, and supplements. General instructions  Do not use any products that contain nicotine or tobacco for at least 4 weeks before the procedure. These products include cigarettes, e-cigarettes, and chewing tobacco. If you need help quitting, ask your health care provider.  Plan to have someone take you home from the hospital or clinic.  If you will be going home right after the procedure, plan to have someone with you for 24 hours.  You may have tests and imaging procedures.  Ask your health care provider: ? How your insertion site will be marked. Ask which artery will be used for the procedure. ? What steps will be taken to help prevent infection. These may include:  Removing hair at the  insertion site.  Washing skin with a germ-killing soap.  Taking antibiotic medicine. What happens during the procedure?   An IV will be inserted into one of your veins.  Electrodes may be placed on your chest to monitor your heart rate during the procedure.  You will be given one or more of the following: ? A medicine to help you relax (sedative). ? A medicine to numb the area (local anesthetic) for catheter insertion.  A small incision will be made for catheter insertion.  The catheter will be inserted into an artery using a guide wire. The location may be in your groin, your wrist, or the fold of your arm (near your elbow).  An X-ray procedure (fluoroscopy) will be used to help guide the catheter to the opening of the heart arteries.  A dye will be injected into the catheter. X-rays will be taken. The dye helps to show where any narrowing or blockages are located in the arteries.  Tell your health care provider if you have chest pain or trouble breathing.  A tiny wire will be guided to the blocked spot, and a balloon will be inflated to make the artery wider.  The stent will be expanded to crush the plaques into the wall of the vessel. The stent will hold the area open and improve the blood flow. Most stents have a drug coating to reduce the risk of the stent narrowing over time.  The artery may be made wider using a drill, laser, or other tools that remove plaques.  The catheter will be removed when the blood flow improves. The stent will stay where it was placed, and the lining of the artery will grow over it.  A bandage (dressing) will be placed on the insertion site. Pressure will be applied to stop bleeding.  The IV will be removed. This procedure may vary among health care providers and hospitals. What happens after the procedure?  Your blood pressure, heart rate, breathing rate, and blood oxygen level will be monitored until you leave the hospital or clinic.  If the  procedure is done through the leg, you will lie flat in bed for a few hours or for as long as told by your health care provider. You will be instructed not to bend or cross your legs.  The insertion site and the pulse in your foot or wrist will be checked often.  You may have more blood tests, X-rays, and a test that records the electrical activity of your heart (electrocardiogram, or ECG).  Do not drive for 24 hours if you were given a sedative during your procedure. Summary  Coronary angiogram with stent placement is a procedure to widen or open  a narrowed coronary artery. This is done to treat heart problems.  Before the procedure, let your health care provider know about all the medical conditions and surgeries you have or have had.  This is a safe procedure. However, some problems may occur, including damage to nearby structures or organs, bleeding, blood clots, or allergies.  Follow your health care provider's instructions about eating, drinking, medicines, and other lifestyle changes, such as quitting tobacco use before the procedure. This information is not intended to replace advice given to you by your health care provider. Make sure you discuss any questions you have with your health care provider. Document Revised: 03/16/2019 Document Reviewed: 03/16/2019 Elsevier Patient Education  2020 [employer].

## 2019-09-23 NOTE — Patient Instructions (Signed)

## 2019-09-24 ENCOUNTER — Other Ambulatory Visit: Payer: Self-pay | Admitting: Internal Medicine

## 2019-09-24 ENCOUNTER — Encounter: Payer: Self-pay | Admitting: Internal Medicine

## 2019-09-24 DIAGNOSIS — R079 Chest pain, unspecified: Secondary | ICD-10-CM

## 2019-09-24 DIAGNOSIS — R0602 Shortness of breath: Secondary | ICD-10-CM

## 2019-09-25 ENCOUNTER — Encounter: Payer: Self-pay | Admitting: Family Medicine

## 2019-09-25 DIAGNOSIS — F4310 Post-traumatic stress disorder, unspecified: Secondary | ICD-10-CM

## 2019-09-26 ENCOUNTER — Encounter: Payer: Self-pay | Admitting: Cardiovascular Disease

## 2019-09-26 ENCOUNTER — Telehealth: Payer: Self-pay | Admitting: Cardiovascular Disease

## 2019-09-26 MED ORDER — HYDROXYZINE PAMOATE 25 MG PO CAPS: 25.0000 mg | ORAL_CAPSULE | Freq: Three times a day (TID) | ORAL | 0 refills | Status: DC | PRN

## 2019-09-26 NOTE — Telephone Encounter (Signed)
lmov to schedule 2 week fu from Jan 26th cath per checkout with End

## 2019-09-27 LAB — TSH: TSH: 2.11 u[IU]/mL (ref 0.450–4.500)

## 2019-09-27 LAB — CBC WITH DIFFERENTIAL/PLATELET
Basophils Absolute: 0.1 10*3/uL (ref 0.0–0.2)
Basos: 1 %
EOS (ABSOLUTE): 0.2 10*3/uL (ref 0.0–0.4)
Eos: 2 %
Hematocrit: 41 % (ref 37.5–51.0)
Hemoglobin: 13.5 g/dL (ref 13.0–17.7)
Immature Grans (Abs): 0 10*3/uL (ref 0.0–0.1)
Immature Granulocytes: 0 %
Lymphocytes Absolute: 2.6 10*3/uL (ref 0.7–3.1)
Lymphs: 25 %
MCH: 27.1 pg (ref 26.6–33.0)
MCHC: 32.9 g/dL (ref 31.5–35.7)
MCV: 82 fL (ref 79–97)
Monocytes Absolute: 0.9 10*3/uL (ref 0.1–0.9)
Monocytes: 8 %
Neutrophils Absolute: 6.6 10*3/uL (ref 1.4–7.0)
Neutrophils: 64 %
Platelets: 324 10*3/uL (ref 150–450)
RBC: 4.98 x10E6/uL (ref 4.14–5.80)
RDW: 13.4 % (ref 11.6–15.4)
WBC: 10.4 10*3/uL (ref 3.4–10.8)

## 2019-09-27 LAB — HIV ANTIBODY (ROUTINE TESTING W REFLEX): HIV Screen 4th Generation wRfx: NONREACTIVE

## 2019-09-27 LAB — GC/CHLAMYDIA PROBE AMP
Chlamydia trachomatis, NAA: NEGATIVE
Neisseria Gonorrhoeae by PCR: NEGATIVE

## 2019-09-27 LAB — LIPID PANEL
Chol/HDL Ratio: 3.1 ratio (ref 0.0–5.0)
Cholesterol, Total: 161 mg/dL (ref 100–199)
HDL: 52 mg/dL (ref >39)
LDL Chol Calc (NIH): 90 mg/dL (ref 0–99)
Triglycerides: 106 mg/dL (ref 0–149)
VLDL Cholesterol Cal: 19 mg/dL (ref 5–40)

## 2019-09-27 LAB — COMPREHENSIVE METABOLIC PANEL
ALT: 26 IU/L (ref 0–44)
AST: 17 IU/L (ref 0–40)
Albumin/Globulin Ratio: 1.7 (ref 1.2–2.2)
Albumin: 4.5 g/dL (ref 4.0–5.0)
Alkaline Phosphatase: 98 IU/L (ref 39–117)
BUN/Creatinine Ratio: 25 — ABNORMAL HIGH (ref 9–20)
BUN: 17 mg/dL (ref 6–20)
Bilirubin Total: 0.4 mg/dL (ref 0.0–1.2)
CO2: 22 mmol/L (ref 20–29)
Calcium: 9.5 mg/dL (ref 8.7–10.2)
Chloride: 99 mmol/L (ref 96–106)
Creatinine, Ser: 0.69 mg/dL — ABNORMAL LOW (ref 0.76–1.27)
GFR calc Af Amer: 142 mL/min/{1.73_m2} (ref >59)
GFR calc non Af Amer: 123 mL/min/{1.73_m2} (ref >59)
Globulin, Total: 2.6 g/dL (ref 1.5–4.5)
Glucose: 127 mg/dL — ABNORMAL HIGH (ref 65–99)
Potassium: 4.6 mmol/L (ref 3.5–5.2)
Sodium: 137 mmol/L (ref 134–144)
Total Protein: 7.1 g/dL (ref 6.0–8.5)

## 2019-09-27 LAB — RPR: RPR Ser Ql: NONREACTIVE

## 2019-09-27 LAB — HEMOGLOBIN A1C
Est. average glucose Bld gHb Est-mCnc: 108 mg/dL
Hgb A1c MFr Bld: 5.4 % (ref 4.8–5.6)

## 2019-09-27 LAB — HEPATITIS C ANTIBODY: Hep C Virus Ab: <0.1 s/co ratio (ref 0.0–0.9)

## 2019-09-27 NOTE — Addendum Note (Signed)
Addended by: Margrett Rud on: 09/27/2019 02:46 PM   Modules accepted: Orders

## 2019-09-28 ENCOUNTER — Other Ambulatory Visit: Payer: Self-pay | Admitting: Family Medicine

## 2019-09-28 DIAGNOSIS — F4310 Post-traumatic stress disorder, unspecified: Secondary | ICD-10-CM

## 2019-09-30 ENCOUNTER — Other Ambulatory Visit: Payer: BC Managed Care – PPO

## 2019-09-30 ENCOUNTER — Other Ambulatory Visit: Payer: Self-pay

## 2019-09-30 ENCOUNTER — Other Ambulatory Visit
Admission: RE | Admit: 2019-09-30 | Discharge: 2019-09-30 | Disposition: A | Discharge status: Home / Self Care | Payer: BC Managed Care – PPO | Source: Ambulatory Visit | Attending: Internal Medicine | Admitting: Internal Medicine

## 2019-09-30 DIAGNOSIS — Z01812 Encounter for preprocedural laboratory examination: Secondary | ICD-10-CM | POA: Diagnosis not present

## 2019-09-30 DIAGNOSIS — Z20822 Contact with and (suspected) exposure to covid-19: Secondary | ICD-10-CM | POA: Insufficient documentation

## 2019-09-30 LAB — SARS CORONAVIRUS 2 (TAT 6-24 HRS): SARS Coronavirus 2: NEGATIVE

## 2019-10-03 NOTE — Telephone Encounter (Signed)
No answer. Left message to call back.   

## 2019-10-03 NOTE — Telephone Encounter (Signed)
Patient calling  Patient wanted clarification of cath tomorrow- clarified it was at [facility] and not [facility] States note says to arrive at 830am but his paperwork states 6:30am Please call to clarify time

## 2019-10-04 ENCOUNTER — Encounter: Payer: Self-pay | Admitting: Internal Medicine

## 2019-10-04 ENCOUNTER — Other Ambulatory Visit: Payer: Self-pay

## 2019-10-04 ENCOUNTER — Ambulatory Visit
Admission: RE | Admit: 2019-10-04 | Discharge: 2019-10-04 | Disposition: A | Discharge status: Home / Self Care | Payer: BC Managed Care – PPO | Attending: Internal Medicine | Admitting: Internal Medicine

## 2019-10-04 ENCOUNTER — Encounter
Admission: RE | Disposition: A | Discharge status: Home / Self Care | Payer: Self-pay | Source: Home / Self Care | Attending: Internal Medicine

## 2019-10-04 ENCOUNTER — Telehealth: Payer: Self-pay

## 2019-10-04 DIAGNOSIS — Z8249 Family history of ischemic heart disease and other diseases of the circulatory system: Secondary | ICD-10-CM | POA: Diagnosis not present

## 2019-10-04 DIAGNOSIS — Z888 Allergy status to other drugs, medicaments and biological substances status: Secondary | ICD-10-CM | POA: Insufficient documentation

## 2019-10-04 DIAGNOSIS — K760 Fatty (change of) liver, not elsewhere classified: Secondary | ICD-10-CM | POA: Diagnosis not present

## 2019-10-04 DIAGNOSIS — R0789 Other chest pain: Secondary | ICD-10-CM | POA: Diagnosis present

## 2019-10-04 DIAGNOSIS — Z6841 Body Mass Index (BMI) 40.0 and over, adult: Secondary | ICD-10-CM | POA: Insufficient documentation

## 2019-10-04 DIAGNOSIS — R079 Chest pain, unspecified: Secondary | ICD-10-CM

## 2019-10-04 DIAGNOSIS — I10 Essential (primary) hypertension: Secondary | ICD-10-CM | POA: Insufficient documentation

## 2019-10-04 DIAGNOSIS — Z79899 Other long term (current) drug therapy: Secondary | ICD-10-CM | POA: Insufficient documentation

## 2019-10-04 DIAGNOSIS — R0602 Shortness of breath: Secondary | ICD-10-CM | POA: Insufficient documentation

## 2019-10-04 DIAGNOSIS — Z7951 Long term (current) use of inhaled steroids: Secondary | ICD-10-CM | POA: Diagnosis not present

## 2019-10-04 DIAGNOSIS — Z885 Allergy status to narcotic agent status: Secondary | ICD-10-CM | POA: Insufficient documentation

## 2019-10-04 DIAGNOSIS — J45909 Unspecified asthma, uncomplicated: Secondary | ICD-10-CM | POA: Insufficient documentation

## 2019-10-04 HISTORY — PX: RIGHT/LEFT HEART CATH AND CORONARY ANGIOGRAPHY: CATH118266

## 2019-10-04 SURGERY — RIGHT/LEFT HEART CATH AND CORONARY ANGIOGRAPHY
Anesthesia: Moderate Sedation

## 2019-10-04 MED ORDER — SODIUM CHLORIDE 0.9% FLUSH: 3.0000 mL | INTRAVENOUS | Status: DC | PRN

## 2019-10-04 MED ORDER — HEPARIN (PORCINE) IN NACL 1000-0.9 UT/500ML-% IV SOLN
INTRAVENOUS | Status: DC | PRN
  Administered 2019-10-04: 500 mL

## 2019-10-04 MED ORDER — FENTANYL CITRATE (PF) 100 MCG/2ML IJ SOLN
INTRAMUSCULAR | Status: AC
  Filled 2019-10-04: qty 2

## 2019-10-04 MED ORDER — SODIUM CHLORIDE 0.9 % IV SOLN: 250.0000 mL | INTRAVENOUS | Status: DC | PRN

## 2019-10-04 MED ORDER — VERAPAMIL HCL 2.5 MG/ML IV SOLN
INTRAVENOUS | Status: DC | PRN
  Administered 2019-10-04: 2.5 mg via INTRA_ARTERIAL

## 2019-10-04 MED ORDER — LABETALOL HCL 5 MG/ML IV SOLN: 10.0000 mg | INTRAVENOUS | Status: DC | PRN

## 2019-10-04 MED ORDER — HEPARIN SODIUM (PORCINE) 1000 UNIT/ML IJ SOLN
INTRAMUSCULAR | Status: AC
  Filled 2019-10-04: qty 1

## 2019-10-04 MED ORDER — ONDANSETRON HCL 4 MG/2ML IJ SOLN: 4.0000 mg | Freq: Four times a day (QID) | INTRAMUSCULAR | Status: DC | PRN

## 2019-10-04 MED ORDER — MIDAZOLAM HCL 2 MG/2ML IJ SOLN
INTRAMUSCULAR | Status: DC | PRN
  Administered 2019-10-04: 1 mg via INTRAVENOUS

## 2019-10-04 MED ORDER — VERAPAMIL HCL 2.5 MG/ML IV SOLN
INTRAVENOUS | Status: AC
  Filled 2019-10-04: qty 2

## 2019-10-04 MED ORDER — FENTANYL CITRATE (PF) 100 MCG/2ML IJ SOLN
INTRAMUSCULAR | Status: DC | PRN
  Administered 2019-10-04 (×2): 25 ug via INTRAVENOUS

## 2019-10-04 MED ORDER — IOHEXOL 300 MG/ML  SOLN
INTRAMUSCULAR | Status: DC | PRN
  Administered 2019-10-04: 90 mL

## 2019-10-04 MED ORDER — MIDAZOLAM HCL 2 MG/2ML IJ SOLN
INTRAMUSCULAR | Status: AC
  Filled 2019-10-04: qty 2

## 2019-10-04 MED ORDER — SODIUM CHLORIDE 0.9 % IV SOLN: INTRAVENOUS | Status: DC

## 2019-10-04 MED ORDER — ASPIRIN 81 MG PO CHEW
CHEWABLE_TABLET | ORAL | Status: AC
  Administered 2019-10-04: 81 mg via ORAL
  Filled 2019-10-04: qty 1

## 2019-10-04 MED ORDER — HEPARIN (PORCINE) IN NACL 1000-0.9 UT/500ML-% IV SOLN
INTRAVENOUS | Status: AC
  Filled 2019-10-04: qty 1000

## 2019-10-04 MED ORDER — DILTIAZEM HCL ER COATED BEADS 180 MG PO CP24: 180.0000 mg | ORAL_CAPSULE | Freq: Every day | ORAL | 11 refills | Status: DC

## 2019-10-04 MED ORDER — ACETAMINOPHEN 325 MG PO TABS: 650.0000 mg | ORAL_TABLET | ORAL | Status: DC | PRN

## 2019-10-04 MED ORDER — SODIUM CHLORIDE 0.9% FLUSH
3.0000 mL | Freq: Two times a day (BID) | INTRAVENOUS | Status: DC
  Administered 2019-10-04: 3 mL via INTRAVENOUS

## 2019-10-04 MED ORDER — HYDRALAZINE HCL 20 MG/ML IJ SOLN: 10.0000 mg | INTRAMUSCULAR | Status: DC | PRN

## 2019-10-04 MED ORDER — HEPARIN SODIUM (PORCINE) 1000 UNIT/ML IJ SOLN
INTRAMUSCULAR | Status: DC | PRN
  Administered 2019-10-04: 5000 [IU] via INTRAVENOUS

## 2019-10-04 MED ORDER — SODIUM CHLORIDE 0.9% FLUSH: 3.0000 mL | Freq: Two times a day (BID) | INTRAVENOUS | Status: DC

## 2019-10-04 MED ORDER — ASPIRIN 81 MG PO CHEW: 81.0000 mg | CHEWABLE_TABLET | ORAL | Status: AC

## 2019-10-04 SURGICAL SUPPLY — 11 items
CATH BALLN WEDGE 5F 110CM (CATHETERS) ×2 IMPLANT
CATH INFINITI 5 FR JL3.5 (CATHETERS) ×2 IMPLANT
CATH INFINITI 5FR ANG PIGTAIL (CATHETERS) ×2 IMPLANT
CATH INFINITI JR4 5F (CATHETERS) ×2 IMPLANT
DEVICE RAD COMP TR BAND LRG (VASCULAR PRODUCTS) ×2 IMPLANT
GLIDESHEATH SLEND SS 6F .021 (SHEATH) ×2 IMPLANT
KIT MANI 3VAL PERCEP (MISCELLANEOUS) ×2 IMPLANT
KIT RIGHT HEART (MISCELLANEOUS) ×2 IMPLANT
PACK CARDIAC CATH (CUSTOM PROCEDURE TRAY) ×2 IMPLANT
SHEATH GLIDE SLENDER 4/5FR (SHEATH) ×2 IMPLANT
WIRE ROSEN-J .035X260CM (WIRE) ×2 IMPLANT

## 2019-10-04 NOTE — Telephone Encounter (Signed)
Copied from CRM [id number]. Topic: Referral - Status >> Oct 04, 2019 11:07 AM Angela Nevin wrote: Patient's wife calling to check status of pulmonary referral. Please advise.   I spoke with this patient's wife and assured her that the referral has been sent out to [facility] and then placed her on hold to see if I could get an appt for this week since she was off to help him after his cardiac cath procedure that was performed on today. I spoke with the receptionist and she stated that she was going to talk with them and give me a call back to let me know if they will be able to see him today.  This information was relayed to Mrs. Neale Burly and I told her that as soon as I have more information, I would give her a call and she said that would be great but she had to go help her husband so if she does not answer it is ok to leave a message.  Appt is set for Thursday, October 06, 2019 @ 9:15am   Patient was informed.

## 2019-10-04 NOTE — Interval H&P Note (Signed)
History and Physical Interval Note:  10/04/2019 9:16 AM  Christian Barrett  has presented today for cardiac catheterization, with the diagnosis of chest pain and shortness of breath.  The various methods of treatment have been discussed with the patient and family. After consideration of risks, benefits and other options for treatment, the patient has consented to  Procedure(s): RIGHT/LEFT HEART CATH AND CORONARY ANGIOGRAPHY (N/A) as a surgical intervention.  The patient's history has been reviewed, patient examined, no change in status, stable for surgery.  I have reviewed the patient's chart and labs.  Questions were answered to the patient's satisfaction.    Cath Lab Visit (complete for each Cath Lab visit)  Clinical Evaluation Leading to the Procedure:   ACS: No.  Non-ACS:    Anginal Classification: CCS IV  Anti-ischemic medical therapy: No Therapy  Non-Invasive Test Results: No non-invasive testing performed  Prior CABG: No previous CABG  [name]

## 2019-10-04 NOTE — Brief Op Note (Signed)
BRIEF CARDIAC CATHETERIZATION NOTE  10/04/2019  10:23 AM  PATIENT:  Christian Barrett  34 y.o. male  PRE-OPERATIVE DIAGNOSIS:  Chest pain and shortness of breath  POST-OPERATIVE DIAGNOSIS:  Same  PROCEDURE:  Procedure(s): RIGHT/LEFT HEART CATH AND CORONARY ANGIOGRAPHY (N/A)  SURGEON:  Surgeon(s) and Role:    * Honest Safranek, Cristal Deer, MD - Primary  FINDINGS: 1. No significant CAD.  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 2. Mildly elevated left heart filling pressure. 3. Moderately to severely elevated right heart filling pressure. 4. Mild pulmonary hypertension. 5. Normal Fick CO/CI. 6. Normal LVEF.  RECOMMENDATIONS: 1. Add diltiazem for medical therapy. 2. Primary prevention of CAD. 3. Weight loss and treatment of obstructive sleep apnea.  Yvonne Kendall, MD [facility]

## 2019-10-04 NOTE — Discharge Instructions (Signed)
Radial Site Care  This sheet gives you information about how to care for yourself after your procedure. Your health care provider may also give you more specific instructions. If you have problems or questions, contact your health care provider. What can I expect after the procedure? After the procedure, it is common to have:  Bruising and tenderness at the catheter insertion area. Follow these instructions at home: Medicines  Take over-the-counter and prescription medicines only as told by your health care provider. Insertion site care  Follow instructions from your health care provider about how to take care of your insertion site. Make sure you: ? Wash your hands with soap and water before you change your bandage (dressing). If soap and water are not available, use hand sanitizer. ? Change your dressing as told by your health care provider. ? Leave stitches (sutures), skin glue, or adhesive strips in place. These skin closures may need to stay in place for 2 weeks or longer. If adhesive strip edges start to loosen and curl up, you may trim the loose edges. Do not remove adhesive strips completely unless your health care provider tells you to do that.  Check your insertion site every day for signs of infection. Check for: ? Redness, swelling, or pain. ? Fluid or blood. ? Pus or a bad smell. ? Warmth.  Do not take baths, swim, or use a hot tub until your health care provider approves.  You may shower 24-48 hours after the procedure, or as directed by your health care provider. ? Remove the dressing and gently wash the site with plain soap and water. ? Pat the area dry with a clean towel. ? Do not rub the site. That could cause bleeding.  Do not apply powder or lotion to the site. Activity   For 24 hours after the procedure, or as directed by your health care provider: ? Do not flex or bend the affected arm. ? Do not push or pull heavy objects with the affected arm. ? Do not  drive yourself home from the hospital or clinic. You may drive 24 hours after the procedure unless your health care provider tells you not to. ? Do not operate machinery or power tools.  Do not lift anything that is heavier than 10 lb (4.5 kg), or the limit that you are told, until your health care provider says that it is safe.  Ask your health care provider when it is okay to: ? Return to work or school. ? Resume usual physical activities or sports. ? Resume sexual activity. General instructions  If the catheter site starts to bleed, raise your arm and put firm pressure on the site. If the bleeding does not stop, get help right away. This is a medical emergency.  If you went home on the same day as your procedure, a responsible adult should be with you for the first 24 hours after you arrive home.  Keep all follow-up visits as told by your health care provider. This is important. Contact a health care provider if:  You have a fever.  You have redness, swelling, or yellow drainage around your insertion site. Get help right away if:  You have unusual pain at the radial site.  The catheter insertion area swells very fast.  The insertion area is bleeding, and the bleeding does not stop when you hold steady pressure on the area.  Your arm or hand becomes pale, cool, tingly, or numb. These symptoms may represent a serious problem   that is an emergency. Do not wait to see if the symptoms will go away. Get medical help right away. Call your local emergency services (911 in the [place] Do not drive yourself to the hospital. Summary  After the procedure, it is common to have bruising and tenderness at the site.  Follow instructions from your health care provider about how to take care of your radial site wound. Check the wound every day for signs of infection.  Do not lift anything that is heavier than 10 lb (4.5 kg), or the limit that you are told, until your health care provider says  that it is safe. This information is not intended to replace advice given to you by your health care provider. Make sure you discuss any questions you have with your health care provider. Document Revised: 09/30/2017 Document Reviewed: 09/30/2017 Elsevier Patient Education  2020 [employer] Moderate Conscious Sedation, Adult, Care After These instructions provide you with information about caring for yourself after your procedure. Your health care provider may also give you more specific instructions. Your treatment has been planned according to current medical practices, but problems sometimes occur. Call your health care provider if you have any problems or questions after your procedure. What can I expect after the procedure? After your procedure, it is common:  To feel sleepy for several hours.  To feel clumsy and have poor balance for several hours.  To have poor judgment for several hours.  To vomit if you eat too soon. Follow these instructions at home: For at least 24 hours after the procedure:   Do not: ? Participate in activities where you could fall or become injured. ? Drive. ? Use heavy machinery. ? Drink alcohol. ? Take sleeping pills or medicines that cause drowsiness. ? Make important decisions or sign legal documents. ? Take care of children on your own.  Rest. Eating and drinking  Follow the diet recommended by your health care provider.  If you vomit: ? Drink water, juice, or soup when you can drink without vomiting. ? Make sure you have little or no nausea before eating solid foods. General instructions  Have a responsible adult stay with you until you are awake and alert.  Take over-the-counter and prescription medicines only as told by your health care provider.  If you smoke, do not smoke without supervision.  Keep all follow-up visits as told by your health care provider. This is important. Contact a health care provider if:  You keep feeling  nauseous or you keep vomiting.  You feel light-headed.  You develop a rash.  You have a fever. Get help right away if:  You have trouble breathing. This information is not intended to replace advice given to you by your health care provider. Make sure you discuss any questions you have with your health care provider. Document Revised: 08/07/2017 Document Reviewed: 12/15/2015 Elsevier Patient Education  2020 [employer] Coronary Angiogram A coronary angiogram is an X-ray procedure that is used to examine the arteries in the heart. Contrast dye is injected through a long, thin tube (catheter) into these arteries. Then X-rays are taken to show any blockage in these arteries. You may have this procedure if you:  Are having chest pain, or other symptoms of angina, and you are at risk for heart disease.  Have an abnormal stress test or test of your heart's electrical activity (electrocardiogram, or ECG).  Have chest pain and heart failure.  Are having irregular heart rhythms. A coronary angiogram   or heart catheterization can show if you have valve disease or a disease of the aorta. This procedure can also be used to check the overall function of your heart muscle. Let your health care provider know about:  Any allergies you have, including allergies to medicines or contrast dye.  All medicines you are taking, including vitamins, herbs, eye drops, creams, and over-the-counter medicines.  Any problems you or family members have had with anesthetic medicines.  Any blood disorders you have.  Any surgeries you have had.  Any history of kidney problems or kidney failure.  Any medical conditions you have.  Whether you are pregnant or may be pregnant.  Whether you are breastfeeding. What are the risks? Generally, this is a safe procedure. However, problems may occur, including:  Infection.  Allergic reaction to medicines or dyes that are used.  Bleeding from the insertion site  or other places.  Damage to nearby structures, such as blood vessels, or damage to kidneys from contrast dye.  Irregular heart rhythms.  Stroke (rare).  Heart attack (rare). What happens before the procedure? Staying hydrated Follow instructions from your health care provider about hydration, which may include:  Up to 2 hours before the procedure - you may continue to drink clear liquids, such as water, clear fruit juice, black coffee, and plain tea.  Eating and drinking restrictions Follow instructions from your health care provider about eating and drinking, which may include:  8 hours before the procedure - stop eating heavy meals or foods, such as meat, fried foods, or fatty foods.  6 hours before the procedure - stop eating light meals or foods, such as toast or cereal.  6 hours before the procedure - stop drinking milk or drinks that contain milk.  2 hours before the procedure - stop drinking clear liquids. Medicines Ask your health care provider about:  Changing or stopping your regular medicines. This is especially important if you are taking diabetes medicines or blood thinners.  Taking medicines such as aspirin and ibuprofen. These medicines can thin your blood. Do not take these medicines unless your health care provider tells you to take them. Aspirin may be recommended before coronary angiograms even if you do not normally take it.  Taking over-the-counter medicines, vitamins, herbs, and supplements. General instructions  Do not use any products that contain nicotine or tobacco for at least 4 weeks before the procedure. These products include cigarettes, e-cigarettes, and chewing tobacco. If you need help quitting, ask your health care provider.  You may have an exam or testing.  Plan to have someone take you home from the hospital or clinic.  If you will be going home right after the procedure, plan to have someone with you for 24 hours.  Ask your health care  provider: ? How your insertion site will be marked. ? What steps will be taken to help prevent infection. These may include:  Removing hair at the insertion site.  Washing skin with a germ-killing soap.  Taking antibiotic medicine. What happens during the procedure?   You will lie on your back on an X-ray table.  An IV will be inserted into one of your veins.  Electrodes will be placed on your chest.  You will be given one or more of the following: ? A medicine to help you relax (sedative). ? A medicine to numb the catheter insertion area (local anesthetic).  You will be connected to a continuous ECG monitor.  The catheter will be inserted into   an artery in one of these areas: ? Your groin area in your upper thigh. ? Your wrist. ? The fold of your arm, near your elbow.  An X-ray procedure (fluoroscopy) will be used to help guide the catheter to the opening of the blood vessel to be used.  A dye will be injected into the catheter and X-rays will be taken. The dye will help to show any narrowing or blockages in the heart arteries.  Tell your health care provider if you have chest pain or trouble breathing.  If blockages are found, another procedure may be done to open the artery.  The catheter will be removed after the fluoroscopy is complete.  A bandage (dressing) will be placed over the insertion site. Pressure will be applied to stop bleeding.  The IV will be removed. The procedure may vary among health care providers and hospitals. What happens after the procedure?  Your blood pressure, heart rate, breathing rate, and blood oxygen level will be monitored until you leave the hospital or clinic.  You will need to lie still for a few hours, or for as long as told by your health care provider. ? If the procedure is done through the groin, you will be told not to bend or cross your legs.  The insertion site and the pulse in your foot or wrist will be checked  often.  More blood tests, X-rays, and an ECG may be done.  Do not drive for 24 hours if you were given a sedative during your procedure. Summary  A coronary angiogram is an X-ray procedure that is used to examine the arteries in the heart.  Contrast dye is injected through a long, thin tube (catheter) into each artery.  Tell your health care provider about any allergies you have, including allergies to contrast dye.  After the procedure, you will need to lie still for a few hours and drink plenty of fluids. This information is not intended to replace advice given to you by your health care provider. Make sure you discuss any questions you have with your health care provider. Document Revised: 03/17/2019 Document Reviewed: 03/17/2019 Elsevier Patient Education  2020 [employer] Inc.  

## 2019-10-05 ENCOUNTER — Encounter: Payer: Self-pay | Admitting: Cardiology

## 2019-10-05 MED ORDER — FUROSEMIDE 40 MG PO TABS: 40.0000 mg | ORAL_TABLET | Freq: Every day | ORAL | 2 refills | Status: DC

## 2019-10-05 NOTE — Telephone Encounter (Signed)
I spoke with Mr. Devol regarding his cath and returning to work.  He notes some pain at the cath sites but no significant swelling or bleeding.  I advised him to limit his activities with the right arm for the next 3 days.  I have also recommended that we add furosemide 40 mg daily, given elevated filling pressures on yesterday's RHC.  He is scheduled for TTE on 2/2 and f/u with Ward Givens, NP, on 2/4.  I think it would be reasonable for Mr. Jenelle Mages to return to work on Sunday, though if he prefers to stay out of work until his f/u appointment on 2/4, that is fine as well.  Yvonne Kendall, MD [facility]

## 2019-10-10 ENCOUNTER — Telehealth: Payer: Self-pay | Admitting: Internal Medicine

## 2019-10-10 NOTE — Telephone Encounter (Signed)
Received [employer] claim forms to be completed Placed in interoffice mail and sent to [employer]

## 2019-10-11 ENCOUNTER — Ambulatory Visit (INDEPENDENT_AMBULATORY_CARE_PROVIDER_SITE_OTHER): Payer: BC Managed Care – PPO

## 2019-10-11 ENCOUNTER — Other Ambulatory Visit: Payer: Self-pay

## 2019-10-11 DIAGNOSIS — R079 Chest pain, unspecified: Secondary | ICD-10-CM | POA: Diagnosis not present

## 2019-10-11 DIAGNOSIS — R0602 Shortness of breath: Secondary | ICD-10-CM

## 2019-10-11 MED ORDER — PERFLUTREN LIPID MICROSPHERE
1.0000 mL | INTRAVENOUS | Status: AC | PRN
  Administered 2019-10-11: 2 mL via INTRAVENOUS

## 2019-10-13 ENCOUNTER — Other Ambulatory Visit: Payer: Self-pay

## 2019-10-13 ENCOUNTER — Encounter: Payer: Self-pay | Admitting: Nurse Practitioner

## 2019-10-13 ENCOUNTER — Ambulatory Visit (INDEPENDENT_AMBULATORY_CARE_PROVIDER_SITE_OTHER): Payer: BC Managed Care – PPO | Admitting: Nurse Practitioner

## 2019-10-13 VITALS — BP 112/74 | HR 98 | Ht 65.0 in | Wt 358.5 lb

## 2019-10-13 DIAGNOSIS — R0602 Shortness of breath: Secondary | ICD-10-CM

## 2019-10-13 DIAGNOSIS — G4733 Obstructive sleep apnea (adult) (pediatric): Secondary | ICD-10-CM

## 2019-10-13 DIAGNOSIS — R079 Chest pain, unspecified: Secondary | ICD-10-CM

## 2019-10-13 DIAGNOSIS — I5032 Chronic diastolic (congestive) heart failure: Secondary | ICD-10-CM | POA: Diagnosis not present

## 2019-10-13 DIAGNOSIS — I10 Essential (primary) hypertension: Secondary | ICD-10-CM

## 2019-10-13 MED ORDER — DILTIAZEM HCL ER COATED BEADS 240 MG PO CP24: 240.0000 mg | ORAL_CAPSULE | Freq: Every day | ORAL | 11 refills | Status: DC

## 2019-10-13 NOTE — Patient Instructions (Signed)
Medication Instructions:  1- INCREASE Diltiazem 240 mg total) once daily  *If you need a refill on your cardiac medications before your next appointment, please call your pharmacy*  Lab Work: Your physician recommends that you have lab work today(BMET)  If you have labs (blood work) drawn today and your tests are completely normal, you will receive your results only by: Marland Kitchen MyChart Message (if you have MyChart) OR . A paper copy in the mail If you have any lab test that is abnormal or we need to change your treatment, we will call you to review the results.  Testing/Procedures: None ordered   Follow-Up: At [facility], you and your health needs are our priority.  As part of our continuing mission to provide you with exceptional heart care, we have created designated Provider Care Teams.  These Care Teams include your primary Cardiologist (physician) and Advanced Practice Providers (APPs -  Physician Assistants and Nurse Practitioners) who all work together to provide you with the care you need, when you need it.  Your next appointment:   3 week(s)  The format for your next appointment:   In Person  Provider:   Please establish a primary cardiologist at your next appt.

## 2019-10-13 NOTE — Progress Notes (Signed)
Office Visit    Patient Name: Christian Barrett Date of Encounter: 10/13/2019  Primary Care Provider:  Hubbard Hartshorn, FNP Primary Cardiologist:  Nelva Bush, MD  Chief Complaint    34 year old male with a history of asthma, bipolar disorder, chronic chest pain, dyspnea on exertion, hypertension, and morbid obesity, who presents for follow-up related to chest pain after recent diagnostic catheterization.  Past Medical History    Past Medical History:  Diagnosis Date  . Asthma   . Bipolar disorder ([place])   . Chest pain    a. 09/2019 Cath: LM nl, LAD nl w/ ? distal myocardial bridge, LCX nl, RCA nl. EF 65%. LVEDP 20, RA 16, PA 37/20(26), PCWP 20. CO/CI 8.0/3.2.  . DOE (dyspnea on exertion)    a. 10/2019 Echo: EF 60-65%, borderline LVH. NO rwma. Nl RV fxn. Triv TR.  Marland Kitchen Hepatic steatosis   . Hypertension   . Morbid obesity ([place])   . Renal disorder    Past Surgical History:  Procedure Laterality Date  . BACK SURGERY    . RIGHT/LEFT HEART CATH AND CORONARY ANGIOGRAPHY N/A 10/04/2019   Procedure: RIGHT/LEFT HEART CATH AND CORONARY ANGIOGRAPHY;  Surgeon: Nelva Bush, MD;  Location: [facility];  Service: Cardiovascular;  Laterality: N/A;  . SHOULDER ARTHROSCOPY  2018   HAD BONE SPURS REMOVED    Allergies  Allergies  Allergen Reactions  . Lamotrigine Rash    And welts And welts  And welts  . Tramadol Nausea And Vomiting and Other (See Comments)      Other reaction(s): Unknown    History of Present Illness    34 year old male with above complex past medical history including asthma, bipolar disorder, chronic chest pain, dyspnea on exertion, hypertension, family history of CAD, hepatic steatosis, and morbid obesity.  He reports a long history of constant chest pain dating back at least 8 or more years.  He also has chronic dyspnea on exertion and notes that he was exposed to an extensive amount of secondhand smoke growing up as his mother smoked a carton of  cigarettes a day.  Chest discomfort is constant at a level of 2/10 and it feels as though his "ribs are crossing" on the right side and also on the right back.  Discomfort is not any better or worse with position changes, deep breathing, or coughing.  More recently, he has noted worsening dyspnea on exertion and also dyspnea at rest with associated orthopnea.  Though his weight has been stable for several years, he is also noted increasing abdominal bloating.  He was evaluated by Dr. Saunders Revel on January 15 and given risk factors and symptoms, decision was made to pursue diagnostic catheterization.  This was performed on January 26 and showed normal coronary arteries with question of a distal LAD myocardial bridge.  Filling pressures were elevated with an LVEDP and wedge of 20.  Cardiac output and index were normal.  He was placed on diltiazem 180 mg daily and also Lasix 40 mg daily.  Since his catheterization, he has had no change in symptoms.  He continues to have constant, 2/10 right chest discomfort and is also noted occasional right shoulder discomfort with occasional paresthesias of the right wrist and hand.  He continues to have dyspnea on exertion as well as orthopnea.  He has not noticed much improvement in the symptoms since starting Lasix.  He has experienced PND and is scheduled for sleep study in the next couple weeks.  He  denies palpitations, dizziness, syncope, edema, or early satiety.  After walking over from the hospital to come to clinic today, he became more short of breath when he got off the elevator and noted worsening chest pain.  He reported chest pain to our nursing staff upon arrival and his ECG was performed during this episode and showed no acute ST or T changes.  Pain has since returned to 2/10.  He says these episodes are pretty common for him and can occur a few times a week.  Home Medications    Prior to Admission medications   Medication Sig Start Date End Date Taking? Authorizing  Provider  albuterol (PROVENTIL) (2.5 MG/3ML) 0.083% nebulizer solution Take 3 mLs (2.5 mg total) by nebulization every 6 (six) hours as needed for wheezing or shortness of breath. 05/19/19  Yes Pia Mau M, PA-C  carbamide peroxide (DEBROX) 6.5 % OTIC solution Place 5 drops into both ears 2 (two) times daily. 09/23/19  Yes Doren Custard, FNP  diltiazem (CARDIZEM CD) 180 MG 24 hr capsule Take 1 capsule (180 mg total) by mouth daily. 10/04/19 10/03/20 Yes End, Cristal Deer, MD  fluticasone (FLONASE) 50 MCG/ACT nasal spray Place 2 sprays into both nostrils daily. Patient taking differently: Place 2 sprays into both nostrils every evening.  09/23/19  Yes Doren Custard, FNP  fluticasone furoate-vilanterol (BREO ELLIPTA) 200-25 MCG/INH AEPB Inhale 1 puff into the lungs daily. Patient taking differently: Inhale 1 puff into the lungs every evening.  09/14/19  Yes Doren Custard, FNP  furosemide (LASIX) 40 MG tablet Take 1 tablet (40 mg total) by mouth daily. 10/05/19 01/03/20 Yes End, Cristal Deer, MD  hydrOXYzine (VISTARIL) 25 MG capsule Take 1 capsule (25 mg total) by mouth 3 (three) times daily as needed. Patient taking differently: Take 25 mg by mouth at bedtime.  09/26/19  Yes Doren Custard, FNP  levocetirizine (XYZAL) 5 MG tablet Take 1 tablet (5 mg total) by mouth every evening. 09/23/19  Yes Doren Custard, FNP  lisinopril (ZESTRIL) 10 MG tablet Take 1 tablet (10 mg total) by mouth daily. Patient taking differently: Take 10 mg by mouth at bedtime.  09/14/19 12/13/19 Yes Doren Custard, FNP  nitroGLYCERIN (NITROSTAT) 0.4 MG SL tablet Place 1 tablet (0.4 mg total) under the tongue every 5 (five) minutes as needed for chest pain (maximum of 3 doses.). 09/23/19  Yes End, Cristal Deer, MD  pantoprazole (PROTONIX) 20 MG tablet Take 1 tablet (20 mg total) by mouth daily. Patient taking differently: Take 20 mg by mouth at bedtime.  09/14/19 12/13/19 Yes Doren Custard, FNP    Review of Systems    Ongoing constant chest  pain with occasional worsening such as earlier today.  Chronic dyspnea on exertion.  Orthopnea and PND.  Chronic abdominal bloating.  He has had some right shoulder pain.  He has noted some paresthesias in the right wrist and hand.  He denies palpitations, dizziness, syncope, edema, or early satiety.  All other systems reviewed and are otherwise negative except as noted above.  Physical Exam    VS:  BP 112/74 (BP Location: Left Arm, Patient Position: Sitting, Cuff Size: Large) Comment: After taking 1 NTG tablet  Pulse 98   Ht 5\' 5"  (1.651 m)   Wt (!) 358 lb 8 oz (162.6 kg)   SpO2 99%   BMI 59.66 kg/m  , BMI Body mass index is 59.66 kg/m. GEN: Morbidly obese, in no acute distress. HEENT: normal. Neck: Supple, obese, difficult to  gauge JVP.  No carotid bruits, or masses. Cardiac: RRR, no murmurs, rubs, or gallops. No clubbing, cyanosis, edema.  Radials/PT 2+ and equal bilaterally.  Right radial catheterization site without bleeding, bruit, or hematoma. Respiratory:  Respirations regular and unlabored, clear to auscultation bilaterally. GI: Obese, soft, nontender, nondistended, BS + x 4. MS: no deformity or atrophy. Skin: warm and dry, no rash. Neuro:  Strength and sensation are intact. Psych: Normal affect.  Accessory Clinical Findings    ECG personally reviewed by me today -regular sinus rhythm, 98 - no acute changes.  Lab Results  Component Value Date   WBC 10.4 09/26/2019   HGB 13.5 09/26/2019   HCT 41.0 09/26/2019   MCV 82 09/26/2019   PLT 324 09/26/2019   Lab Results  Component Value Date   CREATININE 0.69 (L) 09/26/2019   BUN 17 09/26/2019   NA 137 09/26/2019   K 4.6 09/26/2019   CL 99 09/26/2019   CO2 22 09/26/2019   Lab Results  Component Value Date   ALT 26 09/26/2019   AST 17 09/26/2019   ALKPHOS 98 09/26/2019   BILITOT 0.4 09/26/2019   Lab Results  Component Value Date   CHOL 161 09/26/2019   HDL 52 09/26/2019   LDLCALC 90 09/26/2019   TRIG 106  09/26/2019   CHOLHDL 3.1 09/26/2019    Lab Results  Component Value Date   HGBA1C 5.4 09/26/2019    Assessment & Plan    1.  Chest pain: Patient with a long history of chest pain and dyspnea on exertion.  Chest pain is constant in nature with occasional worsening.  He did have worsening of chest pain after getting off the elevator today and ECG performed during this episode is normal.  Chest pain is now back to his usual baseline of 2/10.  Recent catheterization showed normal coronary arteries with question of a distal LAD myocardial bridge.  He is now on diltiazem therapy and I will increase this to 240 mg daily given ongoing symptoms with question of coronary vasospasm (low normal ECG in the setting of chest pain today suggests noncardiac etiology).  2.  Chronic diastolic congestive heart failure: Normal LV function by recent echo.  Elevated filling pressures on recent right and left heart catheterization with an LVEDP and wedge of 20.  He is now on Lasix 40 mg daily and has not seen much improvement in symptoms.  He continues to report orthopnea and PND as well as dyspnea with minimal activity.  I am going to follow-up a basic metabolic panel today.  Provided that renal function is normal, I will consider increasing Lasix to 40 twice daily for a week to see if this helps any.  Heart rate and blood pressure well controlled.  We discussed the importance of daily weights, sodium restriction, medication compliance, and symptom reporting and he verbalizes understanding.   3.  Obstructive sleep apnea: This was apparently diagnosed previously by pulmonology at [facility] however he was unable to wear a mask secondary to panic attacks.  Elevated right heart pressures are likely consistent with this diagnosis.  He is scheduled for follow-up sleep study.  I stressed the importance of treatment of sleep apnea if present.  4.  Morbid obesity: Patient asked if he could take an oral weight loss medication.  He is  currently on a wait list to be seen in the weight loss clinic.  I encouraged him to consider a dietary program such as weight watchers so that he learns  portion control for more sustained weight loss.  Activity is limited by chronic dyspnea and history of asthma.  5.  Essential hypertension: Stable.  6.  Disposition: Follow-up basic metabolic panel today.  Follow-up in 3 to 4 weeks.  Nicolasa Ducking, NP 10/13/2019, 2:17 PM

## 2019-10-14 ENCOUNTER — Telehealth: Payer: Self-pay

## 2019-10-14 DIAGNOSIS — R0602 Shortness of breath: Secondary | ICD-10-CM

## 2019-10-14 LAB — BASIC METABOLIC PANEL
BUN/Creatinine Ratio: 20 (ref 9–20)
BUN: 16 mg/dL (ref 6–20)
CO2: 20 mmol/L (ref 20–29)
Calcium: 9.4 mg/dL (ref 8.7–10.2)
Chloride: 103 mmol/L (ref 96–106)
Creatinine, Ser: 0.81 mg/dL (ref 0.76–1.27)
GFR calc Af Amer: 133 mL/min/{1.73_m2} (ref >59)
GFR calc non Af Amer: 115 mL/min/{1.73_m2} (ref >59)
Glucose: 116 mg/dL — ABNORMAL HIGH (ref 65–99)
Potassium: 4.4 mmol/L (ref 3.5–5.2)
Sodium: 140 mmol/L (ref 134–144)

## 2019-10-14 MED ORDER — FUROSEMIDE 40 MG PO TABS: 40.0000 mg | ORAL_TABLET | Freq: Every day | ORAL | 2 refills | Status: DC

## 2019-10-14 NOTE — Telephone Encounter (Signed)
Call to patient to discuss lab results and POC following labs.  Pt verbalized understanding and agreement with POC. He will inc Lasix for 1 week and [facility] [place] [facility] [place] labs.   Order placed.   Advised pt to call for any further questions or concerns.

## 2019-10-14 NOTE — Telephone Encounter (Signed)
-----   Message from Creig Hines, NP sent at 10/14/2019  7:16 AM EST ----- Renal fxn/lytes wnl. We discussed yesterday that provided kidneys looked ok, we could increase lasix for  a week to see if we could improve abd bloating and breathing.  Pls have him increase  lasix to 40mg  bid x 1 wk w/ f/u bmet in a week.

## 2019-10-17 ENCOUNTER — Emergency Department: Payer: BC Managed Care – PPO

## 2019-10-17 ENCOUNTER — Emergency Department
Admission: EM | Admit: 2019-10-17 | Discharge: 2019-10-17 | Disposition: A | Discharge status: Home / Self Care | Payer: BC Managed Care – PPO | Attending: Emergency Medicine | Admitting: Emergency Medicine

## 2019-10-17 ENCOUNTER — Ambulatory Visit: Payer: Self-pay

## 2019-10-17 ENCOUNTER — Encounter: Payer: Self-pay | Admitting: Family Medicine

## 2019-10-17 ENCOUNTER — Other Ambulatory Visit: Payer: Self-pay

## 2019-10-17 ENCOUNTER — Ambulatory Visit (INDEPENDENT_AMBULATORY_CARE_PROVIDER_SITE_OTHER): Payer: BC Managed Care – PPO | Admitting: Family Medicine

## 2019-10-17 ENCOUNTER — Encounter: Payer: Self-pay | Admitting: Emergency Medicine

## 2019-10-17 DIAGNOSIS — R0602 Shortness of breath: Secondary | ICD-10-CM | POA: Diagnosis not present

## 2019-10-17 DIAGNOSIS — R0600 Dyspnea, unspecified: Secondary | ICD-10-CM | POA: Diagnosis not present

## 2019-10-17 DIAGNOSIS — R61 Generalized hyperhidrosis: Secondary | ICD-10-CM

## 2019-10-17 DIAGNOSIS — I10 Essential (primary) hypertension: Secondary | ICD-10-CM | POA: Insufficient documentation

## 2019-10-17 DIAGNOSIS — R05 Cough: Secondary | ICD-10-CM | POA: Diagnosis not present

## 2019-10-17 DIAGNOSIS — Z79899 Other long term (current) drug therapy: Secondary | ICD-10-CM | POA: Insufficient documentation

## 2019-10-17 DIAGNOSIS — J4550 Severe persistent asthma, uncomplicated: Secondary | ICD-10-CM | POA: Diagnosis not present

## 2019-10-17 DIAGNOSIS — R079 Chest pain, unspecified: Secondary | ICD-10-CM

## 2019-10-17 DIAGNOSIS — J45909 Unspecified asthma, uncomplicated: Secondary | ICD-10-CM | POA: Diagnosis not present

## 2019-10-17 DIAGNOSIS — R042 Hemoptysis: Secondary | ICD-10-CM | POA: Diagnosis not present

## 2019-10-17 DIAGNOSIS — R0609 Other forms of dyspnea: Secondary | ICD-10-CM

## 2019-10-17 DIAGNOSIS — R058 Other specified cough: Secondary | ICD-10-CM

## 2019-10-17 DIAGNOSIS — R0601 Orthopnea: Secondary | ICD-10-CM

## 2019-10-17 DIAGNOSIS — G4733 Obstructive sleep apnea (adult) (pediatric): Secondary | ICD-10-CM

## 2019-10-17 LAB — CBC
HCT: 39.5 % (ref 39.0–52.0)
Hemoglobin: 13.3 g/dL (ref 13.0–17.0)
MCH: 27.1 pg (ref 26.0–34.0)
MCHC: 33.7 g/dL (ref 30.0–36.0)
MCV: 80.4 fL (ref 80.0–100.0)
Platelets: 348 10*3/uL (ref 150–400)
RBC: 4.91 MIL/uL (ref 4.22–5.81)
RDW: 13 % (ref 11.5–15.5)
WBC: 11.9 10*3/uL — ABNORMAL HIGH (ref 4.0–10.5)
nRBC: 0 % (ref 0.0–0.2)

## 2019-10-17 LAB — BASIC METABOLIC PANEL
Anion gap: 12 (ref 5–15)
BUN: 18 mg/dL (ref 6–20)
CO2: 24 mmol/L (ref 22–32)
Calcium: 8.8 mg/dL — ABNORMAL LOW (ref 8.9–10.3)
Chloride: 99 mmol/L (ref 98–111)
Creatinine, Ser: 0.73 mg/dL (ref 0.61–1.24)
GFR calc Af Amer: >60 mL/min (ref >60)
GFR calc non Af Amer: >60 mL/min (ref >60)
Glucose, Bld: 234 mg/dL — ABNORMAL HIGH (ref 70–99)
Potassium: 3.6 mmol/L (ref 3.5–5.1)
Sodium: 135 mmol/L (ref 135–145)

## 2019-10-17 LAB — TROPONIN I (HIGH SENSITIVITY): Troponin I (High Sensitivity): 2 ng/L (ref <18)

## 2019-10-17 MED ORDER — IOHEXOL 350 MG/ML SOLN
75.0000 mL | Freq: Once | INTRAVENOUS | Status: AC | PRN
  Administered 2019-10-17: 75 mL via INTRAVENOUS

## 2019-10-17 NOTE — Telephone Encounter (Signed)
Called patient early for virtual appointment

## 2019-10-17 NOTE — Progress Notes (Signed)
Name: Christian Barrett   MRN: [id number]    DOB: [birth year]   Date:10/17/2019       Progress Note  Subjective  Chief Complaint  Chief Complaint  Patient presents with  . Cough    had a heart Cath 1 week ago, coughing up dark thicj ucous, bloody streaks  . Shortness of Breath    I connected with  Almond Lint  on 10/17/19 at  1:00 PM EST by a video enabled telemedicine application and verified that I am speaking with the correct person using two identifiers.  I discussed the limitations of evaluation and management by telemedicine and the availability of in person appointments. The patient expressed understanding and agreed to proceed. Staff also discussed with the patient that there may be a patient responsible charge related to this service. Patient Location: Home Provider Location: Office Additional Individuals present: None  HPI  Pt presents with concern for cough, shortness of breath, nasal congestion, facial pressure, productive cough with thick dark yellow sputum with occasional blood .  States all symptoms started about 2 weeks ago.  He had heart cath 10/04/19, echo 10/11/19 with follow up with Ignacia Bayley NP-C with cardiology 10/13/19 where he had also noted ongoing chest pain and shortness of breath with orthopnea (has been sleeping with >3 pillows and slightly on his side).  Sharolyn Douglas NP-C's notes are reviewed - patient did increase his diltiazem and Lasix per his recommendation, but has not noticed improvement.  Endorses dyspnea on exertion progressively worsening over the last 2 weeks, and diaphoresis (worse at night). Denies BLE edema, N/V/D. Endorses subjective fevers/chills.    Patient Active Problem List   Diagnosis Date Noted  . Shortness of breath 09/24/2019  . Essential hypertension 09/23/2019  . Chest pain of uncertain etiology 20/25/4270  . Fatty liver 09/23/2019  . Family history of early CAD 09/23/2019  . Severe persistent asthma without complication 62/37/6283  .  OSA (obstructive sleep apnea) 09/23/2019  . Morbid obesity ([place]) 09/23/2019  . PTSD (post-traumatic stress disorder) 09/23/2019    Past Surgical History:  Procedure Laterality Date  . BACK SURGERY    . RIGHT/LEFT HEART CATH AND CORONARY ANGIOGRAPHY N/A 10/04/2019   Procedure: RIGHT/LEFT HEART CATH AND CORONARY ANGIOGRAPHY;  Surgeon: Nelva Bush, MD;  Location: [facility];  Service: Cardiovascular;  Laterality: N/A;  . SHOULDER ARTHROSCOPY  2018   HAD BONE SPURS REMOVED    Family History  Problem Relation Age of Onset  . Coronary artery disease Mother 38       CABG  . Lung cancer Mother   . Diabetes Father   . Heart attack Paternal Grandmother     Social History   Socioeconomic History  . Marital status: Married    Spouse name: Development worker, international aid  . Number of children: 0  . Years of education: Not on file  . Highest education level: Not on file  Occupational History  . Not on file  Tobacco Use  . Smoking status: Never Smoker  . Smokeless tobacco: Never Used  Substance and Sexual Activity  . Alcohol use: Never  . Drug use: Never  . Sexual activity: Never  Other Topics Concern  . Not on file  Social History Narrative   LIVES AT [place]   Social Determinants of Health   Financial Resource Strain:   . Difficulty of Paying Living Expenses: Not on file  Food Insecurity:   . Worried About [employer] of  Food in the Last Year: Not on file  . Ran Out of Food in the Last Year: Not on file  Transportation Needs:   . Lack of Transportation (Medical): Not on file  . Lack of Transportation (Non-Medical): Not on file  Physical Activity:   . Days of Exercise per Week: Not on file  . Minutes of Exercise per Session: Not on file  Stress:   . Feeling of Stress : Not on file  Social Connections:   . Frequency of Communication with Friends and Family: Not on file  . Frequency of Social Gatherings with Friends and Family: Not on file  .  Attends Religious Services: Not on file  . Active Member of Clubs or Organizations: Not on file  . Attends Banker Meetings: Not on file  . Marital Status: Not on file  Intimate Partner Violence:   . Fear of Current or Ex-Partner: Not on file  . Emotionally Abused: Not on file  . Physically Abused: Not on file  . Sexually Abused: Not on file     Current Outpatient Medications:  .  albuterol (PROVENTIL) (2.5 MG/3ML) 0.083% nebulizer solution, Take 3 mLs (2.5 mg total) by nebulization every 6 (six) hours as needed for wheezing or shortness of breath., Disp: 75 mL, Rfl: 12 .  diltiazem (CARDIZEM CD) 240 MG 24 hr capsule, Take 1 capsule (240 mg total) by mouth daily., Disp: 30 capsule, Rfl: 11 .  fluticasone (FLONASE) 50 MCG/ACT nasal spray, Place 2 sprays into both nostrils daily. (Patient taking differently: Place 2 sprays into both nostrils every evening. ), Disp: 16 g, Rfl: 6 .  fluticasone furoate-vilanterol (BREO ELLIPTA) 200-25 MCG/INH AEPB, Inhale 1 puff into the lungs daily. (Patient taking differently: Inhale 1 puff into the lungs every evening. ), Disp: 60 each, Rfl: 3 .  furosemide (LASIX) 40 MG tablet, Take 1 tablet (40 mg total) by mouth daily., Disp: 37 tablet, Rfl: 2 .  hydrOXYzine (VISTARIL) 25 MG capsule, Take 1 capsule (25 mg total) by mouth 3 (three) times daily as needed. (Patient taking differently: Take 25 mg by mouth at bedtime. ), Disp: 30 capsule, Rfl: 0 .  levocetirizine (XYZAL) 5 MG tablet, Take 1 tablet (5 mg total) by mouth every evening., Disp: 90 tablet, Rfl: 1 .  lisinopril (ZESTRIL) 10 MG tablet, Take 1 tablet (10 mg total) by mouth daily. (Patient taking differently: Take 10 mg by mouth at bedtime. ), Disp: 90 tablet, Rfl: 1 .  nitroGLYCERIN (NITROSTAT) 0.4 MG SL tablet, Place 1 tablet (0.4 mg total) under the tongue every 5 (five) minutes as needed for chest pain (maximum of 3 doses.)., Disp: 25 tablet, Rfl: 3 .  pantoprazole (PROTONIX) 20 MG tablet,  Take 1 tablet (20 mg total) by mouth daily. (Patient taking differently: Take 20 mg by mouth at bedtime. ), Disp: 90 tablet, Rfl: 1 .  carbamide peroxide (DEBROX) 6.5 % OTIC solution, Place 5 drops into both ears 2 (two) times daily. (Patient not taking: Reported on 10/17/2019), Disp: 15 mL, Rfl: 3  Allergies  Allergen Reactions  . Lamotrigine Rash    And welts And welts  And welts  . Tramadol Nausea And Vomiting and Other (See Comments)      Other reaction(s): Unknown    I personally reviewed active problem list, medication list, allergies, notes from last encounter, lab results with the patient/caregiver today.   ROS  Ten systems reviewed and is negative except as mentioned in HPI  Objective  Virtual  encounter, vitals not obtained.  There is no height or weight on file to calculate BMI.  Physical Exam  Constitutional: Patient appears well-developed and well-nourished. No distress.  HENT: Head: Normocephalic and atraumatic.  Neck: Normal range of motion. Pulmonary/Chest: Effort moderately increased at rest; speaking in complete sentence. Appears slightly diaphoretic.  Congested cough present. Needing to prop self up in bed during visit. Neurological: Pt is alert and oriented to person, place, and time. Coordination, speech are normal.  Psychiatric: Patient has a normal mood and affect. behavior is normal. Judgment and thought content normal.  No results found for this or any previous visit (from the past 72 hour(s)).  PHQ2/9: Depression screen [facility] 2/9 10/17/2019 09/23/2019 09/14/2019  Decreased Interest 0 0 0  Down, Depressed, Hopeless 0 0 0  PHQ - 2 Score 0 0 0  Altered sleeping 0 0 3  Tired, decreased energy 0 3 0  Change in appetite 0 3 0  Feeling bad or failure about yourself  0 0 0  Trouble concentrating 0 0 0  Moving slowly or fidgety/restless 0 0 0  Suicidal thoughts 0 0 0  PHQ-9 Score 0 6 3  Difficult doing work/chores Not difficult at all Not difficult at all  Not difficult at all   PHQ-2/9 Result is negative.    Fall Risk: Fall Risk  10/17/2019 09/23/2019 09/14/2019  Falls in the past year? 0 0 0  Number falls in past yr: 0 0 0  Injury with Fall? 0 0 0  Follow up Falls evaluation completed - Falls evaluation completed    Assessment & Plan  1. Dyspnea on exertion 2. Orthopnea 3. Cough productive of purulent sputum 4. Severe persistent asthma, unspecified whether complicated 5. Diaphoresis 6. Chest pain of uncertain etiology 7. Morbid obesity (HCC) 8. OSA (obstructive sleep apnea) - After review of recent office visits and lengthy discussion, his symptoms have worsened over the last several days and he is short of breath sitting in bed talking during our visit. I advised that due to his recent heart catheterization, worsening symptoms, and significant risk factors for serious respiratory complications, he needs to be seen in the ER ASAP.  He refuses EMS transport from his home and will have his father-in-law drive him to [facility] ER immediately.  I discussed the assessment and treatment plan with the patient. The patient was provided an opportunity to ask questions and all were answered. The patient agreed with the plan and demonstrated an understanding of the instructions.  The patient was advised to call back or seek an in-person evaluation if the symptoms worsen or if the condition fails to improve as anticipated.  I provided 20 minutes of non-face-to-face time during this encounter.

## 2019-10-17 NOTE — Telephone Encounter (Signed)
Returned call to pt.  Reported he has had increased cough and shortness of breath over the past week.  Stated coughing up dark yellow mucus with very small streaks of blood noted.  Denied fever.  Reported runny nose.  Stated shortness of breath has increased with activity, and admits to shortness of breath at rest.  C/o mid chest pain that radiates to the right shoulder and right arm; rated at 4/10.  Took Ntg. X 1 two days ago with relief.  Had recent heart cath. approx. 2 weeks ago.  Had negative COVID test, pre-procedure, on 1/22.  Denied any recent exposure to known positive COVID individual.   Denied fever.  Denied any other symptoms.  Has virtual appt. Today with PCP at 1:00PM.  Advised to call back or go to ER for worsening symptoms.  Verb. Understanding.   Reason for Disposition . SEVERE coughing spells (e.g., whooping sound after coughing, vomiting after coughing)    Reported awake freq. At night due to cough and mucus production  Answer Assessment - Initial Assessment Questions 1. ONSET: "When did the cough begin?"      Coughing and runny nose x 1 week  2. SEVERITY: "How bad is the cough today?"      Freq. Cough;  3. RESPIRATORY DISTRESS: "Describe your breathing."      Shortness of breath with activity and at rest 4. FEVER: "Do you have a fever?" If so, ask: "What is your temperature, how was it measured, and when did it start?"     No  5. SPUTUM: "Describe the color of your sputum" (clear, white, yellow, green)    Dark yellow  6. HEMOPTYSIS: "Are you coughing up any blood?" If so ask: "How much?" (flecks, streaks, tablespoons, etc.)     Trace of blood in sputum 7. CARDIAC HISTORY: "Do you have any history of heart disease?" (e.g., heart attack, congestive heart failure)      Hx of heart failure 8. LUNG HISTORY: "Do you have any history of lung disease?"  (e.g., pulmonary embolus, asthma, emphysema)     *No Answer* 9. PE RISK FACTORS: "Do you have a history of blood clots?" (or:  recent major surgery, recent prolonged travel, bedridden)     *No Answer* 10. OTHER SYMPTOMS: "Do you have any other symptoms?" (e.g., runny nose, wheezing, chest pain)       Chest pain in center, and radiates to right shoulder and arm ; shortness of breath with activity and rest  11. PREGNANCY: "Is there any chance you are pregnant?" "When was your last menstrual period?"       N/a  12. TRAVEL: "Have you traveled out of the country in the last month?" (e.g., travel history, exposures)       No known exposure to positive COVID  Protocols used: COUGH - ACUTE PRODUCTIVE-A-AH  Message from Darron Doom sent at 10/17/2019 11:02 AM EST  Patient called to say that he was seen by his cardiologist last week and is coughing up mucus with blood mixed in. Say that per Cardiologist her was informed to contact his PCP but he wanted to know what he should be doing Please call patient at Ph# [phone]

## 2019-10-17 NOTE — ED Provider Notes (Signed)
[facility] Emergency Department Provider Note  ____________________________________________   I have reviewed the triage vital signs and the nursing notes.   HISTORY  Chief Complaint Shortness of Breath and Hemoptysis   History limited by: Not Limited   HPI Christian Barrett is a 34 y.o. male who presents to the emergency department today because of concern for shortness of breath and chest pain. The patient states that for the past month he has noticed chest discomfort and some shortness of breath. This is more pronounced when the patient is trying to stand up out of his couch. His chest pain is located on the right side and radiates to his right arm. He recently underwent a catheterization to investigate his symptoms and this did not show any CAD. He has also had a cough productive of yellow phlegm with some blood in it. Denies any history of blood clots.    Records reviewed. Per medical record review patient has a history of asthma, chest pain, recent catheterization.   Past Medical History:  Diagnosis Date  . Asthma   . Bipolar disorder (HCC)   . Chest pain    a. 09/2019 Cath: LM nl, LAD nl w/ ? distal myocardial bridge, LCX nl, RCA nl. EF 65%. LVEDP 20, RA 16, PA 37/20(26), PCWP 20. CO/CI 8.0/3.2.  . DOE (dyspnea on exertion)    a. 10/2019 Echo: EF 60-65%, borderline LVH. NO rwma. Nl RV fxn. Triv TR.  Marland Kitchen Hepatic steatosis   . Hypertension   . Morbid obesity (HCC)   . Renal disorder     Patient Active Problem List   Diagnosis Date Noted  . Shortness of breath 09/24/2019  . Essential hypertension 09/23/2019  . Chest pain of uncertain etiology 09/23/2019  . Fatty liver 09/23/2019  . Family history of early CAD 09/23/2019  . Severe persistent asthma without complication 09/23/2019  . OSA (obstructive sleep apnea) 09/23/2019  . Morbid obesity (HCC) 09/23/2019  . PTSD (post-traumatic stress disorder) 09/23/2019    Past Surgical History:  Procedure  Laterality Date  . BACK SURGERY    . RIGHT/LEFT HEART CATH AND CORONARY ANGIOGRAPHY N/A 10/04/2019   Procedure: RIGHT/LEFT HEART CATH AND CORONARY ANGIOGRAPHY;  Surgeon: Yvonne Kendall, MD;  Location: [facility] INVASIVE CV LAB;  Service: Cardiovascular;  Laterality: N/A;  . SHOULDER ARTHROSCOPY  2018   HAD BONE SPURS REMOVED    Prior to Admission medications   Medication Sig Start Date End Date Taking? Authorizing Provider  albuterol (PROVENTIL) (2.5 MG/3ML) 0.083% nebulizer solution Take 3 mLs (2.5 mg total) by nebulization every 6 (six) hours as needed for wheezing or shortness of breath. 05/19/19   Orvil Feil, PA-C  carbamide peroxide (DEBROX) 6.5 % OTIC solution Place 5 drops into both ears 2 (two) times daily. Patient not taking: Reported on 10/17/2019 09/23/19   Doren Custard, FNP  diltiazem (CARDIZEM CD) 240 MG 24 hr capsule Take 1 capsule (240 mg total) by mouth daily. 10/13/19 10/12/20  Creig Hines, NP  fluticasone (FLONASE) 50 MCG/ACT nasal spray Place 2 sprays into both nostrils daily. Patient taking differently: Place 2 sprays into both nostrils every evening.  09/23/19   Doren Custard, FNP  fluticasone furoate-vilanterol (BREO ELLIPTA) 200-25 MCG/INH AEPB Inhale 1 puff into the lungs daily. Patient taking differently: Inhale 1 puff into the lungs every evening.  09/14/19   Doren Custard, FNP  furosemide (LASIX) 40 MG tablet Take 1 tablet (40 mg total) by mouth daily. 10/14/19 01/12/20  Theora Gianotti, NP  hydrOXYzine (VISTARIL) 25 MG capsule Take 1 capsule (25 mg total) by mouth 3 (three) times daily as needed. Patient taking differently: Take 25 mg by mouth at bedtime.  09/26/19   Hubbard Hartshorn, FNP  levocetirizine (XYZAL) 5 MG tablet Take 1 tablet (5 mg total) by mouth every evening. 09/23/19   Hubbard Hartshorn, FNP  lisinopril (ZESTRIL) 10 MG tablet Take 1 tablet (10 mg total) by mouth daily. Patient taking differently: Take 10 mg by mouth at bedtime.  09/14/19 12/13/19   Hubbard Hartshorn, FNP  nitroGLYCERIN (NITROSTAT) 0.4 MG SL tablet Place 1 tablet (0.4 mg total) under the tongue every 5 (five) minutes as needed for chest pain (maximum of 3 doses.). 09/23/19   End, Harrell Gave, MD  pantoprazole (PROTONIX) 20 MG tablet Take 1 tablet (20 mg total) by mouth daily. Patient taking differently: Take 20 mg by mouth at bedtime.  09/14/19 12/13/19  Hubbard Hartshorn, FNP    Allergies Lamotrigine and Tramadol  Family History  Problem Relation Age of Onset  . Coronary artery disease Mother 51       CABG  . Lung cancer Mother   . Diabetes Father   . Heart attack Paternal Grandmother     Social History Social History   Tobacco Use  . Smoking status: Never Smoker  . Smokeless tobacco: Never Used  Substance Use Topics  . Alcohol use: Never  . Drug use: Never    Review of Systems Constitutional: No fever/chills Eyes: No visual changes. ENT: No sore throat. Cardiovascular: Positive for chest pain. Respiratory: Positive for shortness of breath. Gastrointestinal: No abdominal pain.  No nausea, no vomiting.  No diarrhea.   Genitourinary: Negative for dysuria. Musculoskeletal: Negative for back pain. Skin: Negative for rash. Neurological: Negative for headaches, focal weakness or numbness.  ____________________________________________   PHYSICAL EXAM:  VITAL SIGNS: ED Triage Vitals  Enc Vitals Group     BP 10/17/19 1312 (!) 146/97     Pulse Rate 10/17/19 1308 (!) 107     Resp 10/17/19 1308 (!) 28     Temp 10/17/19 1312 98.5 F (36.9 C)     Temp Source 10/17/19 1312 Oral     SpO2 10/17/19 1308 96 %     Weight 10/17/19 1308 (!) 355 lb (161 kg)     Height 10/17/19 1308 5\' 5"  (1.651 m)     Head Circumference --      Peak Flow --      Pain Score 10/17/19 1308 5   Constitutional: Alert and oriented.  Eyes: Conjunctivae are normal.  ENT      Head: Normocephalic and atraumatic.      Nose: No congestion/rhinnorhea.      Mouth/Throat: Mucous membranes are  moist.      Neck: No stridor. Hematological/Lymphatic/Immunilogical: No cervical lymphadenopathy. Cardiovascular: Normal rate, regular rhythm.  No murmurs, rubs, or gallops.  Respiratory: Normal respiratory effort without tachypnea nor retractions. Breath sounds are clear and equal bilaterally. No wheezes/rales/rhonchi. Gastrointestinal: Soft and non tender. No rebound. No guarding.  Genitourinary: Deferred Musculoskeletal: Normal range of motion in all extremities. No lower extremity edema. Neurologic:  Normal speech and language. No gross focal neurologic deficits are appreciated.  Skin:  Skin is warm, dry and intact. No rash noted. Psychiatric: Mood and affect are normal. Speech and behavior are normal. Patient exhibits appropriate insight and judgment.  ____________________________________________    LABS (pertinent positives/negatives)  CBC wbc 11.9, hgb 13.3, plt 348  BMP wnl except glu 234, ca 8.8 Trop hs 2 ____________________________________________   EKG  I, Phineas Semen, attending physician, personally viewed and interpreted this EKG  EKG Time: 1309 Rate: 103 Rhythm: sinus tachycardia Axis: left axis deviation Intervals: qtc 448 QRS: incomplete RBBB ST changes: no st elevation Impression: abnormal ekg   ____________________________________________    RADIOLOGY  CXR No acute abnormality  CT angio No PE, lungs clear.  ____________________________________________   PROCEDURES  Procedures  ____________________________________________   INITIAL IMPRESSION / ASSESSMENT AND PLAN / ED COURSE  Pertinent labs & imaging results that were available during my care of the patient were reviewed by me and considered in my medical decision making (see chart for details).   Patient presented to the emergency department today because of concern for continued and worsening shortness of breath with some chest discomfort. Patient had recent catheterization to  evaluate these symptoms. Today also complaining of some bloody cough. Because of this CT angio was obtained to evaluate for PE. CT angio without obvious PE or other etiology of the patient's symptoms. Does have nebulizer at home for asthma. Discussed with patient that he can try to use the nebulizer to see if it helps with current symptoms. Will give patient pulmonology follow up information.  ____________________________________________   FINAL CLINICAL IMPRESSION(S) / ED DIAGNOSES  Final diagnoses:  Shortness of breath     Note: This dictation was prepared with Dragon dictation. Any transcriptional errors that result from this process are unintentional     Phineas Semen, MD 10/17/19 [phone]

## 2019-10-17 NOTE — Discharge Instructions (Addendum)
Please seek medical attention for any high fevers, chest pain, shortness of breath, change in behavior, persistent vomiting, bloody stool or any other new or concerning symptoms.  

## 2019-10-17 NOTE — ED Triage Notes (Addendum)
Pt here for [facility] that has been present for couple weeks. Reports recent heart cath.  Few days ago started with hemoptysis.  + orthopnea.  + DOE.  VSS at this time.  Has some chest pain that is not new per patients descriptions.  Also c/o metallic taste in mouth.  No fevers.  Mildly labored after ambulation to triage but WOB eased after sitting in triage room.  Chest pain radiates to right shoulder but this is typical of his baseline CP

## 2019-10-18 ENCOUNTER — Ambulatory Visit: Payer: BC Managed Care – PPO | Attending: Specialist

## 2019-10-18 DIAGNOSIS — G4733 Obstructive sleep apnea (adult) (pediatric): Secondary | ICD-10-CM | POA: Diagnosis not present

## 2019-10-19 ENCOUNTER — Other Ambulatory Visit: Payer: Self-pay

## 2019-10-21 ENCOUNTER — Ambulatory Visit: Payer: BC Managed Care – PPO | Admitting: Family Medicine

## 2019-11-07 ENCOUNTER — Other Ambulatory Visit: Payer: Self-pay

## 2019-11-07 ENCOUNTER — Ambulatory Visit (INDEPENDENT_AMBULATORY_CARE_PROVIDER_SITE_OTHER): Payer: BC Managed Care – PPO | Admitting: Internal Medicine

## 2019-11-07 ENCOUNTER — Telehealth: Payer: Self-pay | Admitting: Internal Medicine

## 2019-11-07 ENCOUNTER — Encounter: Payer: Self-pay | Admitting: Family Medicine

## 2019-11-07 ENCOUNTER — Encounter: Payer: Self-pay | Admitting: Internal Medicine

## 2019-11-07 VITALS — BP 122/84 | HR 87 | Ht 65.0 in | Wt 357.8 lb

## 2019-11-07 DIAGNOSIS — R0602 Shortness of breath: Secondary | ICD-10-CM | POA: Diagnosis not present

## 2019-11-07 NOTE — Progress Notes (Signed)
Follow-up Outpatient Visit Date: 11/07/2019  Primary Care Provider: Hubbard Hartshorn, [address] [place] [address]  Chief Complaint: Chest pain, shortness of breath, and right arm tingling  HPI:  Christian Barrett is a 34 y.o. male with history of asthma, bipolar disorder, chronic chest pain and dyspnea on exertion, hypertension, morbid obesity, who presents for follow-up of chest pain.  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  Right heart pressures were moderately to severely elevated with equalization of Eurydice Calixto-diastolic pressures.  It was felt that his hemodynamics were most consistent with sleep apnea and morbid obesity.  He was started on low-dose diltiazem and furosemide.  At his last visit, he continued to have chest pain as well as exertional dyspnea with minimal activity, orthopnea, and PND.  Furosemide was increased to 40 mg twice daily for a week.  Diltiazem was also increased to 240 mg daily.  He presented to the [facility] ED 4 days later complaining of dyspnea and chest pain he also reported a productive cough with yellow sputum and mild blood-tinged.  Work-up was unrevealing, including negative CTA of the chest.  Today, Christian Barrett continues to have frequent chest pain that has been unchanged with addition of diltiazem and increased dose of furosemide.  He is also sweating a lot to the point where his bed is wet at night.  He also has frequent diaphoresis at work when he is sitting still.  He has stable exertional dyspnea as well as orthopnea.  Interestingly, he feels that his breathing is best when he lies on his stomach.  He noticed slight improvement in his dyspnea and edema after increasing furosemide to 80 mg daily.  He then admits to missing several days but is now back on 80 mg daily.  He feels like his  symptoms are back to where they had been at his last visit.  Christian Barrett also reports some tingling and pins and needle sensation in his right arm extending from the shoulder to the hand.  He denies trauma to the area.  He also has frequent numbness in both legs when he sits for more than just a few minutes.  He notes a history of low back surgery.  He has tried modifying his diet but has not lost any weight.  He is not exercising.  He notes that climbing 1 flight of stairs at work is challenging for him.  He is on a wait list to see a weight loss specialist.  He was recently found to have sleep apnea and is scheduled for CPAP titration later this month.  Christian Barrett notes that he frequently will drink up to or above 128 ounces of water per day.  --------------------------------------------------------------------------------------------------  Past Medical History:  Diagnosis Date  . Asthma   . Bipolar disorder ([place])   . Chest pain    a. 09/2019 Cath: LM nl, LAD nl w/ ? distal myocardial bridge, LCX nl, RCA nl. EF 65%. LVEDP 20, RA 16, PA 37/20(26), PCWP 20. CO/CI 8.0/3.2.  . DOE (dyspnea on exertion)    a. 10/2019 Echo: EF 60-65%, borderline LVH. NO rwma. Nl RV fxn. Triv TR.  Marland Kitchen Hepatic steatosis   . Hypertension   . Morbid obesity ([place])   . Renal disorder    Past Surgical History:  Procedure Laterality Date  . BACK SURGERY    .  RIGHT/LEFT HEART CATH AND CORONARY ANGIOGRAPHY N/A 10/04/2019   Procedure: RIGHT/LEFT HEART CATH AND CORONARY ANGIOGRAPHY;  Surgeon: Yvonne Kendall, MD;  Location: [facility] INVASIVE CV LAB;  Service: Cardiovascular;  Laterality: N/A;  . SHOULDER ARTHROSCOPY  2018   HAD BONE SPURS REMOVED    Current Meds  Medication Sig  . albuterol (PROVENTIL) (2.5 MG/3ML) 0.083% nebulizer solution Take 3 mLs (2.5 mg total) by nebulization every 6 (six) hours as needed for wheezing or shortness of breath.  . diltiazem (CARDIZEM CD) 240 MG 24 hr capsule Take 1 capsule (240 mg total)  by mouth daily.  . fluticasone furoate-vilanterol (BREO ELLIPTA) 200-25 MCG/INH AEPB Inhale 1 puff into the lungs daily. (Patient taking differently: Inhale 1 puff into the lungs every evening. )  . furosemide (LASIX) 40 MG tablet Take 1 tablet (40 mg total) by mouth daily. (Patient taking differently: Take 80 mg by mouth daily. )  . levocetirizine (XYZAL) 5 MG tablet Take 1 tablet (5 mg total) by mouth every evening.  Marland Kitchen lisinopril (ZESTRIL) 10 MG tablet Take 1 tablet (10 mg total) by mouth daily. (Patient taking differently: Take 10 mg by mouth at bedtime. )  . nitroGLYCERIN (NITROSTAT) 0.4 MG SL tablet Place 1 tablet (0.4 mg total) under the tongue every 5 (five) minutes as needed for chest pain (maximum of 3 doses.).  Marland Kitchen pantoprazole (PROTONIX) 20 MG tablet Take 1 tablet (20 mg total) by mouth daily. (Patient taking differently: Take 20 mg by mouth at bedtime. )    Allergies: Lamotrigine and Tramadol  Social History   Tobacco Use  . Smoking status: Never Smoker  . Smokeless tobacco: Never Used  Substance Use Topics  . Alcohol use: Never  . Drug use: Never    Family History  Problem Relation Age of Onset  . Coronary artery disease Mother 48       CABG  . Lung cancer Mother   . Diabetes Father   . Heart attack Paternal Grandmother     Review of Systems: A 12-system review of systems was performed and was negative except as noted in the HPI.  --------------------------------------------------------------------------------------------------  Physical Exam: BP 122/84 (BP Location: Left Wrist, Patient Position: Sitting, Cuff Size: Normal)   Pulse 87   Ht 5\' 5"  (1.651 m)   Wt (!) 357 lb 12 oz (162.3 kg)   BMI 59.53 kg/m   General: NAD. Neck: Unable to assess JVD due to body habitus. Lungs: Clear to auscultation bilaterally without wheezes or crackles. Heart: Regular rate and rhythm without murmurs, rubs, or gallops. Abdomen: Obese and soft. Extremities: Trace pretibial  edema bilaterally.  EKG: Normal sinus rhythm without significant abnormality.  Lab Results  Component Value Date   WBC 11.9 (H) 10/17/2019   HGB 13.3 10/17/2019   HCT 39.5 10/17/2019   MCV 80.4 10/17/2019   PLT 348 10/17/2019    Lab Results  Component Value Date   NA 135 10/17/2019   K 3.6 10/17/2019   CL 99 10/17/2019   CO2 24 10/17/2019   BUN 18 10/17/2019   CREATININE 0.73 10/17/2019   GLUCOSE 234 (H) 10/17/2019   ALT 26 09/26/2019    Lab Results  Component Value Date   CHOL 161 09/26/2019   HDL 52 09/26/2019   LDLCALC 90 09/26/2019   TRIG 106 09/26/2019   CHOLHDL 3.1 09/26/2019    --------------------------------------------------------------------------------------------------  ASSESSMENT AND PLAN: Chronic HFpEF: Christian Barrett has essentially unchanged weight and is likely still at least somewhat volume overloaded, driven predominantly  by right heart failure.  I have asked him to continue furosemide 80 mg daily.  I will check a basic metabolic panel today as well as a BNP.  I encouraged him to move forward with CPAP titration and to work on weight loss.  Importance of sodium and fluid restriction was also reinforced, as it sounds like Christian Barrett drinks up to 128 ounces of water per day.  I asked him to try to limit it to 2 L or less.  Chest pain: Quality the pain remains atypical.  Recent catheterization showed no significant CAD, though there was question of myocardial bridging in the distal LAD.  I do not believe this alone is driving his symptoms.  We can continue diltiazem 240 mg daily.  No further cardiac intervention is planned at this time.  Obstructive sleep apnea: Recently diagnosed with plans for CPAP titration later this month.  Right hand paresthesia: Quality of discomfort is consistent with neuropathic etiology.  If symptoms persist, he should follow-up with his PCP for further assessment.  Morbid obesity: I encouraged Christian Barrett to lose weight  through diet and exercise.  I agree with moving forward with bariatric evaluation.  Follow-up: Return to clinic in 1 month.  Approximately 30 minutes were spent on this counter, of which more than 50% was spent face-to-face counseling Christian Barrett.  Yvonne Kendall, MD 11/07/2019 8:20 AM

## 2019-11-07 NOTE — Telephone Encounter (Signed)
L MOM TO SCHEDULE 1 MONTH F/U

## 2019-11-07 NOTE — Patient Instructions (Signed)
Medication Instructions:  Your physician recommends that you continue on your current medications as directed. Please refer to the Current Medication list given to you today.  *If you need a refill on your cardiac medications before your next appointment, please call your pharmacy*  Lab Work: Your physician recommends that you return for lab work in: TODAY- BMP, BNP.  If you have labs (blood work) drawn today and your tests are completely normal, you will receive your results only by: Marland Kitchen MyChart Message (if you have MyChart) OR . A paper copy in the mail If you have any lab test that is abnormal or we need to change your treatment, we will call you to review the results.  Testing/Procedures: none  Follow-Up: At [facility], you and your health needs are our priority.  As part of our continuing mission to provide you with exceptional heart care, we have created designated Provider Care Teams.  These Care Teams include your primary Cardiologist (physician) and Advanced Practice Providers (APPs -  Physician Assistants and Nurse Practitioners) who all work together to provide you with the care you need, when you need it.  We recommend signing up for the patient portal called "MyChart".  Sign up information is provided on this After Visit Summary.  MyChart is used to connect with patients for Virtual Visits (Telemedicine).  Patients are able to view lab/test results, encounter notes, upcoming appointments, etc.  Non-urgent messages can be sent to your provider as well.   To learn more about what you can do with MyChart, go to [email or web address].    Your next appointment:   1 month(s) with an APP.  The format for your next appointment:   In Person  Provider:    You may see one of the following Advanced Practice Providers on your designated Care Team:    Nicolasa Ducking, NP  Eula Listen, PA-C  Marisue Ivan, PA-C

## 2019-11-08 ENCOUNTER — Other Ambulatory Visit: Payer: Self-pay | Admitting: Family Medicine

## 2019-11-08 DIAGNOSIS — Z8669 Personal history of other diseases of the nervous system and sense organs: Secondary | ICD-10-CM

## 2019-11-09 LAB — BASIC METABOLIC PANEL
BUN/Creatinine Ratio: 25 — ABNORMAL HIGH (ref 9–20)
BUN: 14 mg/dL (ref 6–20)
CO2: 19 mmol/L — ABNORMAL LOW (ref 20–29)
Calcium: 9 mg/dL (ref 8.7–10.2)
Chloride: 104 mmol/L (ref 96–106)
Creatinine, Ser: 0.55 mg/dL — ABNORMAL LOW (ref 0.76–1.27)
GFR calc Af Amer: 156 mL/min/{1.73_m2} (ref >59)
GFR calc non Af Amer: 135 mL/min/{1.73_m2} (ref >59)
Glucose: 116 mg/dL — ABNORMAL HIGH (ref 65–99)
Potassium: 4.5 mmol/L (ref 3.5–5.2)
Sodium: 140 mmol/L (ref 134–144)

## 2019-11-09 LAB — BRAIN NATRIURETIC PEPTIDE: BNP: 8.5 pg/mL (ref 0.0–100.0)

## 2019-11-10 ENCOUNTER — Telehealth: Payer: Self-pay | Admitting: Internal Medicine

## 2019-11-10 NOTE — Telephone Encounter (Signed)
-----   Message from Stann Mainland, RN sent at 11/10/2019  2:47 PM EST ----- Regarding: f/u appt Hi there,  Probably patient didn't want to schedule as leaving but in doing follow up I see patient needs 1 month f/u with APP.  Thanks,

## 2019-11-10 NOTE — Telephone Encounter (Signed)
Attempted to schedule.  LMOV to call office.  ° °

## 2019-11-14 NOTE — Telephone Encounter (Signed)
Attempted to schedule.  LMOV to call office.  ° °

## 2019-11-15 ENCOUNTER — Other Ambulatory Visit
Admission: RE | Admit: 2019-11-15 | Discharge: 2019-11-15 | Disposition: A | Discharge status: Home / Self Care | Payer: BC Managed Care – PPO | Source: Ambulatory Visit | Attending: Specialist | Admitting: Specialist

## 2019-11-15 ENCOUNTER — Other Ambulatory Visit: Payer: Self-pay

## 2019-11-15 DIAGNOSIS — Z20822 Contact with and (suspected) exposure to covid-19: Secondary | ICD-10-CM | POA: Diagnosis not present

## 2019-11-15 DIAGNOSIS — Z01812 Encounter for preprocedural laboratory examination: Secondary | ICD-10-CM | POA: Insufficient documentation

## 2019-11-15 DIAGNOSIS — R569 Unspecified convulsions: Secondary | ICD-10-CM | POA: Insufficient documentation

## 2019-11-15 DIAGNOSIS — F445 Conversion disorder with seizures or convulsions: Secondary | ICD-10-CM | POA: Insufficient documentation

## 2019-11-15 DIAGNOSIS — Z62819 Personal history of unspecified abuse in childhood: Secondary | ICD-10-CM | POA: Insufficient documentation

## 2019-11-16 ENCOUNTER — Telehealth: Payer: Self-pay | Admitting: Internal Medicine

## 2019-11-16 LAB — SARS CORONAVIRUS 2 (TAT 6-24 HRS): SARS Coronavirus 2: NEGATIVE

## 2019-11-16 NOTE — Telephone Encounter (Signed)
Form placed in Dr Serita Kyle office for his review.

## 2019-11-16 NOTE — Telephone Encounter (Signed)
Ciox red folder returned, placed in nurse box

## 2019-11-17 ENCOUNTER — Ambulatory Visit: Payer: BC Managed Care – PPO | Attending: Internal Medicine

## 2019-11-17 DIAGNOSIS — G4733 Obstructive sleep apnea (adult) (pediatric): Secondary | ICD-10-CM | POA: Diagnosis not present

## 2019-11-17 NOTE — Telephone Encounter (Signed)
Dr End reviewed the forms from [facility]. It notes that anesthesia was administered by a licensed anesthesiologist. Dr End advised that no anesthesia was given, only moderate sedation. He asked that forms be sent back to [facility] for correction and then back to [place] for final review and signature.  Given to front office staff.

## 2019-11-21 ENCOUNTER — Other Ambulatory Visit: Payer: Self-pay

## 2019-11-28 ENCOUNTER — Encounter: Payer: Self-pay | Admitting: Family Medicine

## 2019-11-29 ENCOUNTER — Telehealth (HOSPITAL_COMMUNITY): Payer: Self-pay | Admitting: Psychiatry

## 2019-11-29 NOTE — Telephone Encounter (Signed)
D:  Tamara (referral coordinator) referred pt to [facility].  A:  Placed call to orient pt, but there was no answer.  Left vm requesting pt to contact case manager.  Inform Delaney Meigs.

## 2019-12-12 NOTE — Telephone Encounter (Signed)
Attempted to schedule.  LMOV to call office.  ° °3 attempts to schedule fu appt  ° °Closing encounter. ° °

## 2019-12-22 ENCOUNTER — Emergency Department
Admission: EM | Admit: 2019-12-22 | Discharge: 2019-12-22 | Disposition: A | Discharge status: Home / Self Care | Payer: No Typology Code available for payment source | Attending: Emergency Medicine | Admitting: Emergency Medicine

## 2019-12-22 ENCOUNTER — Other Ambulatory Visit: Payer: Self-pay

## 2019-12-22 ENCOUNTER — Emergency Department: Payer: No Typology Code available for payment source

## 2019-12-22 DIAGNOSIS — Z79899 Other long term (current) drug therapy: Secondary | ICD-10-CM | POA: Diagnosis not present

## 2019-12-22 DIAGNOSIS — I10 Essential (primary) hypertension: Secondary | ICD-10-CM | POA: Diagnosis not present

## 2019-12-22 DIAGNOSIS — M25561 Pain in right knee: Secondary | ICD-10-CM | POA: Insufficient documentation

## 2019-12-22 MED ORDER — HYDROCODONE-ACETAMINOPHEN 5-325 MG PO TABS
1.0000 | ORAL_TABLET | Freq: Once | ORAL | Status: AC
  Administered 2019-12-22: 20:00:00 1 via ORAL
  Filled 2019-12-22: qty 1

## 2019-12-22 MED ORDER — HYDROCODONE-ACETAMINOPHEN 5-325 MG PO TABS: 1.0000 | ORAL_TABLET | Freq: Four times a day (QID) | ORAL | 0 refills | Status: AC | PRN

## 2019-12-22 NOTE — ED Notes (Signed)
Pt states he fell at work and hurt his right knee. Pt states knee is having spasms.

## 2019-12-22 NOTE — ED Triage Notes (Addendum)
Pt comes via POV from home with c/o right knee pain. Pt states injury at work. Pt states his leg slid into a rack and he fell forward. Pt denies any LOC. Pt states he unsure if he sprained his knee. Pt states pain from knee down to leg.   Pt wishes to file WC.

## 2019-12-22 NOTE — ED Provider Notes (Signed)
Emergency Department Provider Note  ____________________________________________  Time seen: Approximately 7:39 PM  I have reviewed the triage vital signs and the nursing notes.   HISTORY  Chief Complaint Knee Pain   Historian Patient    HPI Christian Barrett is a 34 y.o. male presents to the emergency department with acute right knee pain.  Patient states that he slid his foot into a rack and fell forward.  Patient symptoms difficulty bearing weight since injury occurred.  Patient states that pain in right knee radiates into his right calf.  No similar injuries in the past.   Past Medical History:  Diagnosis Date  . Asthma   . Bipolar disorder (HCC)   . Chest pain    a. 09/2019 Cath: LM nl, LAD nl w/ ? distal myocardial bridge, LCX nl, RCA nl. EF 65%. LVEDP 20, RA 16, PA 37/20(26), PCWP 20. CO/CI 8.0/3.2.  . DOE (dyspnea on exertion)    a. 10/2019 Echo: EF 60-65%, borderline LVH. NO rwma. Nl RV fxn. Triv TR.  Marland Kitchen Hepatic steatosis   . Hypertension   . Morbid obesity (HCC)   . Renal disorder      Immunizations up to date:  Yes.     Past Medical History:  Diagnosis Date  . Asthma   . Bipolar disorder (HCC)   . Chest pain    a. 09/2019 Cath: LM nl, LAD nl w/ ? distal myocardial bridge, LCX nl, RCA nl. EF 65%. LVEDP 20, RA 16, PA 37/20(26), PCWP 20. CO/CI 8.0/3.2.  . DOE (dyspnea on exertion)    a. 10/2019 Echo: EF 60-65%, borderline LVH. NO rwma. Nl RV fxn. Triv TR.  Marland Kitchen Hepatic steatosis   . Hypertension   . Morbid obesity (HCC)   . Renal disorder     Patient Active Problem List   Diagnosis Date Noted  . Shortness of breath 09/24/2019  . Essential hypertension 09/23/2019  . Chest pain of uncertain etiology 09/23/2019  . Fatty liver 09/23/2019  . Family history of early CAD 09/23/2019  . Severe persistent asthma without complication 09/23/2019  . OSA (obstructive sleep apnea) 09/23/2019  . Morbid obesity (HCC) 09/23/2019  . PTSD (post-traumatic stress  disorder) 09/23/2019    Past Surgical History:  Procedure Laterality Date  . BACK SURGERY    . RIGHT/LEFT HEART CATH AND CORONARY ANGIOGRAPHY N/A 10/04/2019   Procedure: RIGHT/LEFT HEART CATH AND CORONARY ANGIOGRAPHY;  Surgeon: Yvonne Kendall, MD;  Location: [facility] INVASIVE CV LAB;  Service: Cardiovascular;  Laterality: N/A;  . SHOULDER ARTHROSCOPY  2018   HAD BONE SPURS REMOVED    Prior to Admission medications   Medication Sig Start Date End Date Taking? Authorizing Provider  albuterol (PROVENTIL) (2.5 MG/3ML) 0.083% nebulizer solution Take 3 mLs (2.5 mg total) by nebulization every 6 (six) hours as needed for wheezing or shortness of breath. 05/19/19   Orvil Feil, PA-C  diltiazem (CARDIZEM CD) 240 MG 24 hr capsule Take 1 capsule (240 mg total) by mouth daily. 10/13/19 10/12/20  Creig Hines, NP  fluticasone furoate-vilanterol (BREO ELLIPTA) 200-25 MCG/INH AEPB Inhale 1 puff into the lungs daily. Patient taking differently: Inhale 1 puff into the lungs every evening.  09/14/19   Doren Custard, FNP  furosemide (LASIX) 40 MG tablet Take 1 tablet (40 mg total) by mouth daily. Patient taking differently: Take 80 mg by mouth daily.  10/14/19 01/12/20  Creig Hines, NP  HYDROcodone-acetaminophen (NORCO) 5-325 MG tablet Take 1 tablet by mouth every 6 (six)  hours as needed for up to 2 days for moderate pain. 12/22/19 12/24/19  Lannie Fields, PA-C  levocetirizine (XYZAL) 5 MG tablet Take 1 tablet (5 mg total) by mouth every evening. 09/23/19   Hubbard Hartshorn, FNP  lisinopril (ZESTRIL) 10 MG tablet Take 1 tablet (10 mg total) by mouth daily. Patient taking differently: Take 10 mg by mouth at bedtime.  09/14/19 12/13/19  Hubbard Hartshorn, FNP  nitroGLYCERIN (NITROSTAT) 0.4 MG SL tablet Place 1 tablet (0.4 mg total) under the tongue every 5 (five) minutes as needed for chest pain (maximum of 3 doses.). 09/23/19   End, Harrell Gave, MD  pantoprazole (PROTONIX) 20 MG tablet Take 1 tablet (20  mg total) by mouth daily. Patient taking differently: Take 20 mg by mouth at bedtime.  09/14/19 12/13/19  Hubbard Hartshorn, FNP    Allergies Lamotrigine and Tramadol  Family History  Problem Relation Age of Onset  . Coronary artery disease Mother 89       CABG  . Lung cancer Mother   . Diabetes Father   . Heart attack Paternal Grandmother     Social History Social History   Tobacco Use  . Smoking status: Never Smoker  . Smokeless tobacco: Never Used  Substance Use Topics  . Alcohol use: Never  . Drug use: Never     Review of Systems  Constitutional: No fever/chills Eyes:  No discharge ENT: No upper respiratory complaints. Respiratory: no cough. No SOB/ use of accessory muscles to breath Gastrointestinal:   No nausea, no vomiting.  No diarrhea.  No constipation. Musculoskeletal: Patient has right knee pain.  Skin: Negative for rash, abrasions, lacerations, ecchymosis.    ____________________________________________   PHYSICAL EXAM:  VITAL SIGNS: ED Triage Vitals  Enc Vitals Group     BP 12/22/19 1739 125/87     Pulse Rate 12/22/19 1739 96     Resp 12/22/19 1739 18     Temp 12/22/19 1739 98 F (36.7 C)     Temp src --      SpO2 12/22/19 1739 100 %     Weight 12/22/19 1737 (!) 350 lb (158.8 kg)     Height 12/22/19 1737 5\' 5"  (1.651 m)     Head Circumference --      Peak Flow --      Pain Score 12/22/19 1737 10     Pain Loc --      Pain Edu? --      Excl. in [place]? --      Constitutional: Alert and oriented. Well appearing and in no acute distress. Eyes: Conjunctivae are normal. PERRL. EOMI. Head: Atraumatic. ENT:      Ears: TMs are pearly.       Nose: No congestion/rhinnorhea.      Mouth/Throat: Mucous membranes are moist.  Neck: No stridor.  No cervical spine tenderness to palpation. Cardiovascular: Normal rate, regular rhythm. Normal S1 and S2.  Good peripheral circulation. Respiratory: Normal respiratory effort without tachypnea or retractions. Lungs  CTAB. Good air entry to the bases with no decreased or absent breath sounds Musculoskeletal: Provocative testing at right knee is limited due to pain.  No palpable deficit over the insertion for the right Achilles tendon.  Palpable dorsalis pedis pulse bilaterally and symmetrically. Neurologic:  Normal for age. No gross focal neurologic deficits are appreciated.  Skin:  Skin is warm, dry and intact. No rash noted. Psychiatric: Mood and affect are normal for age. Speech and behavior are normal.  ____________________________________________   LABS (all labs ordered are listed, but only abnormal results are displayed)  Labs Reviewed - No data to display ____________________________________________  EKG   ____________________________________________  RADIOLOGY Geraldo Pitter, personally viewed and evaluated these images (plain radiographs) as part of my medical decision making, as well as reviewing the written report by the radiologist.  DG Knee Complete 4 Views Right  Result Date: 12/22/2019 CLINICAL DATA:  Right knee pain, fell EXAM: RIGHT KNEE - COMPLETE 4+ VIEW COMPARISON:  None. FINDINGS: Frontal, bilateral oblique, and lateral views of the right knee are obtained. No fracture, subluxation, or dislocation. Joint spaces are well preserved. No joint effusion. IMPRESSION: 1. Unremarkable right knee. Electronically Signed   By: Sharlet Salina M.D.   On: 12/22/2019 19:07    ____________________________________________    PROCEDURES  Procedure(s) performed:     Procedures     Medications - No data to display   ____________________________________________   INITIAL IMPRESSION / ASSESSMENT AND PLAN / ED COURSE  Pertinent labs & imaging results that were available during my care of the patient were reviewed by me and considered in my medical decision making (see chart for details).      Assessment and plan Right knee pain 34 year old male presents to the emergency  department with acute right knee pain after an injury occurred at work.  X-ray examination reveals no bony abnormality.  Patient was placed in a knee sleeve and crutches were provided.  He was advised to follow-up with orthopedics as needed.  A work note was provided.  All patient questions were answered.    ____________________________________________  FINAL CLINICAL IMPRESSION(S) / ED DIAGNOSES  Final diagnoses:  Acute pain of right knee      NEW MEDICATIONS STARTED DURING THIS VISIT:  ED Discharge Orders         Ordered    HYDROcodone-acetaminophen (NORCO) 5-325 MG tablet  Every 6 hours PRN     12/22/19 1932              This chart was dictated using voice recognition software/Dragon. Despite best efforts to proofread, errors can occur which can change the meaning. Any change was purely unintentional.     Orvil Feil, PA-C 12/22/19 1943    Emily Filbert, MD 12/22/19 1950

## 2019-12-22 NOTE — ED Notes (Signed)
This RN reviewed discharge instructions, follow-up care, prescriptions, cryotherapy, splint use, crutch use, and need for elevation with patient. Patient verbalized understanding of all reviewed information.  Patient stable, with no distress noted at this time.

## 2020-01-25 ENCOUNTER — Ambulatory Visit: Payer: Self-pay | Admitting: Gastroenterology

## 2020-02-23 ENCOUNTER — Other Ambulatory Visit: Payer: Self-pay | Admitting: Orthopedic Surgery

## 2020-02-23 DIAGNOSIS — M5431 Sciatica, right side: Secondary | ICD-10-CM

## 2020-02-25 ENCOUNTER — Ambulatory Visit
Admission: RE | Admit: 2020-02-25 | Discharge: 2020-02-25 | Disposition: A | Discharge status: Home / Self Care | Payer: Managed Care, Other (non HMO) | Source: Ambulatory Visit | Attending: Orthopedic Surgery | Admitting: Orthopedic Surgery

## 2020-02-25 ENCOUNTER — Other Ambulatory Visit: Payer: Self-pay

## 2020-02-25 DIAGNOSIS — M5431 Sciatica, right side: Secondary | ICD-10-CM | POA: Diagnosis not present

## 2020-02-28 DIAGNOSIS — R200 Anesthesia of skin: Secondary | ICD-10-CM | POA: Insufficient documentation

## 2020-03-02 ENCOUNTER — Telehealth: Payer: Self-pay | Admitting: Gastroenterology

## 2020-03-02 NOTE — Telephone Encounter (Signed)
I spoke with patient to see if he wanted to reschedule his appointment that was cancelled for 01-25-2020. He states he doesn't at this time not having this pain lately.

## 2020-03-05 ENCOUNTER — Other Ambulatory Visit: Payer: Self-pay

## 2020-03-05 ENCOUNTER — Ambulatory Visit (INDEPENDENT_AMBULATORY_CARE_PROVIDER_SITE_OTHER): Payer: Managed Care, Other (non HMO) | Admitting: Family Medicine

## 2020-03-05 ENCOUNTER — Telehealth: Payer: Self-pay | Admitting: Family Medicine

## 2020-03-05 ENCOUNTER — Encounter: Payer: Self-pay | Admitting: Family Medicine

## 2020-03-05 VITALS — BP 132/82 | HR 100 | Temp 97.9°F | Resp 18 | Wt 359.6 lb

## 2020-03-05 DIAGNOSIS — N5089 Other specified disorders of the male genital organs: Secondary | ICD-10-CM | POA: Diagnosis not present

## 2020-03-05 DIAGNOSIS — J4550 Severe persistent asthma, uncomplicated: Secondary | ICD-10-CM | POA: Diagnosis not present

## 2020-03-05 DIAGNOSIS — R1011 Right upper quadrant pain: Secondary | ICD-10-CM

## 2020-03-05 DIAGNOSIS — H6983 Other specified disorders of Eustachian tube, bilateral: Secondary | ICD-10-CM | POA: Diagnosis not present

## 2020-03-05 DIAGNOSIS — Z760 Encounter for issue of repeat prescription: Secondary | ICD-10-CM

## 2020-03-05 DIAGNOSIS — I10 Essential (primary) hypertension: Secondary | ICD-10-CM

## 2020-03-05 DIAGNOSIS — J310 Chronic rhinitis: Secondary | ICD-10-CM

## 2020-03-05 MED ORDER — METFORMIN HCL ER (MOD) 500 MG PO TB24: 500.0000 mg | ORAL_TABLET | Freq: Two times a day (BID) | ORAL | 1 refills | Status: DC

## 2020-03-05 MED ORDER — MONTELUKAST SODIUM 10 MG PO TABS: 10.0000 mg | ORAL_TABLET | Freq: Every day | ORAL | 5 refills | Status: DC

## 2020-03-05 MED ORDER — LEVOCETIRIZINE DIHYDROCHLORIDE 5 MG PO TABS: 5.0000 mg | ORAL_TABLET | Freq: Every evening | ORAL | 5 refills | Status: DC

## 2020-03-05 MED ORDER — PREDNISONE 10 MG PO TABS: ORAL_TABLET | ORAL | 0 refills | Status: DC

## 2020-03-05 MED ORDER — TRELEGY ELLIPTA 200-62.5-25 MCG/INH IN AEPB: 1.0000 | INHALATION_SPRAY | Freq: Every day | RESPIRATORY_TRACT | 11 refills | Status: DC

## 2020-03-05 MED ORDER — IPRATROPIUM-ALBUTEROL 0.5-2.5 (3) MG/3ML IN SOLN: 3.0000 mL | Freq: Three times a day (TID) | RESPIRATORY_TRACT | 1 refills | Status: DC | PRN

## 2020-03-05 NOTE — Telephone Encounter (Signed)
Im assuming trellegy?

## 2020-03-05 NOTE — Telephone Encounter (Signed)
Pt called stating that the medication that was prescribed for him is going to cost him $2000+. Pt is requesting to have other medications sent in for him that would be cheaper. Please advise.      [id number] [place], [place] - [address]  [place] [address]  Phone: [phone] Fax: [phone]  Hours: Not open 24 hours

## 2020-03-05 NOTE — Patient Instructions (Addendum)
For your breathing - start the steroids and the dose will decrease over the next 2 weeks  Start the daily inhaler - let me know RIGHT AWAY if you cannot get the inhaler - this is KEY to getting everything controlled Start the daily allergy/asthma meds - xyzal and montelukast daily at bedtime.  Use the nebulizers as needed until breathing gets worse.  If you are using nebs more than 2x a day then I need you to follow up in the next 2-4 weeks  Start metformin one pill a day for the next 2 weeks then increase to one pill twice a day

## 2020-03-05 NOTE — Progress Notes (Signed)
Patient ID: Christian Barrett, male    DOB: 17-Feb-1986, 34 y.o.   MRN: [id number]  PCP: Christian Berry, PA-C  Chief Complaint  Patient presents with  . Consult    A1c on labs from [facility] was 6.3  . Mass    lump left side of testicle    Subjective:   Christian Barrett is a 34 y.o. male, presents to clinic with CC of the following:  HPI   Pt presents for A1C of 6.3 done at [facility] neurology - where he is being seen and evaluated for LE neuropathy  He has evident increased WOB and wheeze - poorly controlled asthmatic, not using daily inhaler  Has left sided scrotal mass noticed 3-4 weeks ago, comes and goes, "uncomfortable"    Patient Active Problem List   Diagnosis Date Noted  . Shortness of breath 09/24/2019  . Essential hypertension 09/23/2019  . Chest pain of uncertain etiology 09/23/2019  . Fatty liver 09/23/2019  . Family history of early CAD 09/23/2019  . Severe persistent asthma without complication 09/23/2019  . OSA (obstructive sleep apnea) 09/23/2019  . Morbid obesity (HCC) 09/23/2019  . PTSD (post-traumatic stress disorder) 09/23/2019      Current Outpatient Medications:  .  albuterol (PROVENTIL) (2.5 MG/3ML) 0.083% nebulizer solution, Take 3 mLs (2.5 mg total) by nebulization every 6 (six) hours as needed for wheezing or shortness of breath., Disp: 75 mL, Rfl: 12 .  diltiazem (CARDIZEM CD) 240 MG 24 hr capsule, Take 1 capsule (240 mg total) by mouth daily., Disp: 30 capsule, Rfl: 11 .  escitalopram (LEXAPRO) 10 MG tablet, Take by mouth., Disp: , Rfl:  .  gabapentin (NEURONTIN) 100 MG capsule, Take 100 mg twice a day for one week, then increase to 200 mg(2 tablets) twice a day and continue, Disp: , Rfl:  .  levocetirizine (XYZAL) 5 MG tablet, Take 1 tablet (5 mg total) by mouth every evening., Disp: 90 tablet, Rfl: 1 .  pantoprazole (PROTONIX) 20 MG tablet, Take 1 tablet (20 mg total) by mouth daily. (Patient taking differently: Take 20 mg by mouth at bedtime. ),  Disp: 90 tablet, Rfl: 1 .  fluticasone furoate-vilanterol (BREO ELLIPTA) 200-25 MCG/INH AEPB, Inhale 1 puff into the lungs daily. (Patient not taking: Reported on 03/05/2020), Disp: 60 each, Rfl: 3 .  furosemide (LASIX) 40 MG tablet, Take 1 tablet (40 mg total) by mouth daily. (Patient taking differently: Take 80 mg by mouth daily. ), Disp: 37 tablet, Rfl: 2 .  lisinopril (ZESTRIL) 10 MG tablet, Take 1 tablet (10 mg total) by mouth daily. (Patient taking differently: Take 10 mg by mouth at bedtime. ), Disp: 90 tablet, Rfl: 1 .  metFORMIN (GLUMETZA) 500 MG (MOD) 24 hr tablet, Take 1 tablet (500 mg total) by mouth in the morning and at bedtime., Disp: 120 tablet, Rfl: 1 .  nitroGLYCERIN (NITROSTAT) 0.4 MG SL tablet, Place 1 tablet (0.4 mg total) under the tongue every 5 (five) minutes as needed for chest pain (maximum of 3 doses.). (Patient not taking: Reported on 03/05/2020), Disp: 25 tablet, Rfl: 3   Allergies  Allergen Reactions  . Lamotrigine Rash    And welts And welts  And welts  . Tramadol Nausea And Vomiting and Other (See Comments)      Other reaction(s): Unknown     Social History   Tobacco Use  . Smoking status: Never Smoker  . Smokeless tobacco: Never Used  Vaping Use  . Vaping Use: Never used  Substance Use Topics  . Alcohol use: Never  . Drug use: Never      Chart Review Today: I personally reviewed active problem list, medication list, allergies, family history, social history, health maintenance, notes from last encounter, lab results, imaging with the patient/caregiver today.   Review of Systems  Constitutional: Negative.   HENT: Negative.   Eyes: Negative.   Respiratory: Positive for chest tightness, shortness of breath and wheezing.   Cardiovascular: Negative.  Negative for chest pain, palpitations and leg swelling.  Gastrointestinal: Negative.   Endocrine: Negative.   Genitourinary: Positive for scrotal swelling and testicular pain. Negative for  decreased urine volume, difficulty urinating, discharge, dysuria, enuresis, flank pain, frequency, genital sores, hematuria, penile pain, penile swelling and urgency.  Musculoskeletal: Negative.   Skin: Negative.   Allergic/Immunologic: Negative.   Neurological: Negative.   Hematological: Negative.   Psychiatric/Behavioral: Negative.   All other systems reviewed and are negative.      Objective:   Vitals:   03/05/20 0809  BP: 132/82  Pulse: 100  Resp: 18  Temp: 97.9 F (36.6 C)  TempSrc: Temporal  SpO2: 96%  Weight: (!) 359 lb 9.6 oz (163.1 kg)    Body mass index is 59.84 kg/m.  Physical Exam Vitals and nursing note reviewed. Exam conducted with a chaperone present.  Constitutional:      General: He is not in acute distress.    Appearance: Normal appearance. He is well-developed. He is obese. He is diaphoretic. He is not ill-appearing or toxic-appearing.     Interventions: Face mask in place.  HENT:     Head: Normocephalic and atraumatic.     Jaw: No trismus.     Right Ear: External ear normal.     Left Ear: External ear normal.  Eyes:     General: Lids are normal. No scleral icterus.    Conjunctiva/sclera: Conjunctivae normal.     Pupils: Pupils are equal, round, and reactive to light.  Neck:     Trachea: Trachea and phonation normal. No tracheal deviation.  Cardiovascular:     Rate and Rhythm: Normal rate and regular rhythm.     Pulses: Normal pulses.          Radial pulses are 2+ on the right side and 2+ on the left side.       Posterior tibial pulses are 2+ on the right side and 2+ on the left side.     Heart sounds: Normal heart sounds. No murmur heard.  No friction rub. No gallop.   Pulmonary:     Effort: Pulmonary effort is normal. Tachypnea (mild) present. No accessory muscle usage, respiratory distress or retractions.     Breath sounds: No stridor. Wheezing present. No rhonchi or rales.     Comments: Diffuse inspiratory and expiratory wheeze heard in all  lung fields, audible wheeze Abdominal:     General: Bowel sounds are normal. There is no distension.     Palpations: Abdomen is soft.     Tenderness: There is no abdominal tenderness. There is no guarding or rebound.     Hernia: There is no hernia in the left inguinal area.  Genitourinary:    Penis: Normal and circumcised.      Testes:        Right: Mass, tenderness or swelling not present.        Left: Mass, tenderness or swelling not present.     Epididymis:     Right: Normal. No mass or tenderness.  Left: Normal. No mass or tenderness.  Musculoskeletal:        General: Normal range of motion.     Cervical back: Normal range of motion and neck supple.     Right lower leg: No edema.     Left lower leg: No edema.  Lymphadenopathy:     Lower Body: No left inguinal adenopathy.  Skin:    General: Skin is warm.     Coloration: Skin is not jaundiced.     Findings: No rash.     Nails: There is no clubbing.  Neurological:     Mental Status: He is alert.     Cranial Nerves: No dysarthria or facial asymmetry.     Motor: No tremor or abnormal muscle tone.     Gait: Gait normal.  Psychiatric:        Mood and Affect: Mood is anxious.        Speech: Speech normal.        Behavior: Behavior normal. Behavior is cooperative.      Results for orders placed or performed during the hospital encounter of 11/15/19  SARS CORONAVIRUS 2 (TAT 6-24 HRS) Nasopharyngeal Nasopharyngeal Swab   Specimen: Nasopharyngeal Swab  Result Value Ref Range   SARS Coronavirus 2 NEGATIVE NEGATIVE       Assessment & Plan:   1. Severe persistent asthma without complication Severely uncontrolled asthmatic, not compliant with currently prescribed inhalers, he states his maintenance inhaler is too expensive for him and so he is using it rarely, currently mildly tachypneic, with audible wheeze, on exam inspiratory and expiratory wheeze, will treat with a burst of steroids try and get nebulizer machine, nebs,  continue maintenance inhalers and rescue inhaler and get him back on allergy medications and Singulair  - predniSONE (DELTASONE) 10 MG tablet; 6 tabs poqd 1-2, 5 poqd 3-4.Marland Kitchen1 tab poqd 11-12  Dispense: 42 tablet; Refill: 0 - Fluticasone-Umeclidin-Vilant (TRELEGY ELLIPTA) 200-62.5-25 MCG/INH AEPB; Inhale 1 puff into the lungs daily.  Dispense: 28 each; Refill: 11 - montelukast (SINGULAIR) 10 MG tablet; Take 1 tablet (10 mg total) by mouth at bedtime. For asthma and allergies  Dispense: 30 tablet; Refill: 5 - levocetirizine (XYZAL) 5 MG tablet; Take 1 tablet (5 mg total) by mouth every evening.  Dispense: 30 tablet; Refill: 5 - ipratropium-albuterol (DUONEB) 0.5-2.5 (3) MG/3ML SOLN; Take 3 mLs by nebulization 3 (three) times daily as needed.  Dispense: 360 mL; Refill: 1  He was given a coupon for Trelegy this was sent in, he was also encouraged to follow-up with his insurance company and pharmaceutical covers to let me know what inhalers would be affordable for him  2. Eustachian tube dysfunction, bilateral Likely to untreated uncontrolled allergies encouraged him to use steroid nasal spray in addition to antihistamines, can use decongestants  3. Scrotal mass No mass currently the patient reports intermittent fullness and swelling that slightly uncomfortable has come and gone for several months, could possibly be a inguinal hernia that self reduces may also be varicocele or a hydrocele?  No skin abnormalities, lymphadenopathy, penile discharge or concern for STDs at this time, patient was very anxious and hesitant to do any genital exam which limited the quality of my exam today  Did discuss possibly referring to a general surgeon for further evaluation of a possible hernia or referring to urology for further evaluation pending scrotal ultrasound results  - US Scrotum; Future - US SCROTUM DOPPLER  4. Morbid obesity ([place]) With multiple comorbidities  5. Chronic rhinitis  Resume treatment for  allergies, second-generation antihistamines, Singulair and steroid nasal spray, may need follow-up with ENT - levocetirizine (XYZAL) 5 MG tablet; Take 1 tablet (5 mg total) by mouth every evening.  Dispense: 30 tablet; Refill: 5  6. Essential hypertension-most of his medications are managed by the specialist, cardiology, on lisinopril, diltiazem, Lasix Blood pressure currently borderline but patient is very short of breath with asthma exacerbation, will monitor  7. Medication refill Fill on patient's meds, he is new to me, he will be due for routine follow-up to further address his diagnoses associated with these medicines - gabapentin (NEURONTIN) 100 MG capsule; Take 100 mg twice a day for one week, then increase to 200 mg(2 tablets) twice a day and continue - escitalopram (LEXAPRO) 10 MG tablet; Take by mouth. - metFORMIN (GLUMETZA) 500 MG (MOD) 24 hr tablet; Take 1 tablet (500 mg total) by mouth in the morning and at bedtime.  Dispense: 120 tablet; Refill: 1      Christian Berry, PA-C 03/05/20 8:43 AM

## 2020-03-06 NOTE — Telephone Encounter (Signed)
Patient called back to speak to Christian Barrett states that his insurance is only medical and that he does not have medication coverage. He is asking if there is something that can be prescribed that will not cost him a lot because $2000 is way too much for him to manage. Please advise

## 2020-03-06 NOTE — Telephone Encounter (Signed)
Left detailed vm °

## 2020-03-06 NOTE — Telephone Encounter (Signed)
Called patient back and told him that we still would not know how much they cost, he would still need to call pharmacy and ask and see what he was willing to pay and let us know.

## 2020-03-07 ENCOUNTER — Telehealth: Payer: Self-pay | Admitting: Family Medicine

## 2020-03-07 MED ORDER — METFORMIN HCL ER 500 MG PO TB24: 500.0000 mg | ORAL_TABLET | Freq: Two times a day (BID) | ORAL | Status: DC

## 2020-03-07 NOTE — Telephone Encounter (Signed)
Pharmacy called in stating the prescription Glumetza is expensive for patient and they would like permission to provide Metformin Glucophage XR extend relief 24hr 500mg  .please advise

## 2020-03-08 MED ORDER — PANTOPRAZOLE SODIUM 20 MG PO TBEC: 20.0000 mg | DELAYED_RELEASE_TABLET | Freq: Every day | ORAL | 3 refills | Status: DC

## 2020-03-16 ENCOUNTER — Ambulatory Visit: Admission: RE | Admit: 2020-03-16 | Payer: Managed Care, Other (non HMO) | Source: Ambulatory Visit

## 2020-03-20 ENCOUNTER — Other Ambulatory Visit: Payer: Self-pay

## 2020-03-20 MED ORDER — METFORMIN HCL ER 500 MG PO TB24
500.0000 mg | ORAL_TABLET | Freq: Two times a day (BID) | ORAL | Status: DC
Start: 2020-03-20 — End: 2020-08-28

## 2020-03-20 MED ORDER — METFORMIN HCL ER 500 MG PO TB24: 500.0000 mg | ORAL_TABLET | Freq: Two times a day (BID) | ORAL | 3 refills | Status: DC

## 2020-03-20 NOTE — Telephone Encounter (Signed)
Patient is calling again to request that the nurse or doctor call him regarding getting his medication at a less expensive cost since he does not have insurance.  Please call patient to discuss at [phone]

## 2020-03-20 NOTE — Telephone Encounter (Signed)
Med sent in.

## 2020-03-22 DIAGNOSIS — G479 Sleep disorder, unspecified: Secondary | ICD-10-CM | POA: Insufficient documentation

## 2020-03-22 DIAGNOSIS — F419 Anxiety disorder, unspecified: Secondary | ICD-10-CM | POA: Insufficient documentation

## 2020-04-06 ENCOUNTER — Telehealth: Payer: Self-pay

## 2020-04-06 ENCOUNTER — Other Ambulatory Visit: Payer: Self-pay

## 2020-04-06 ENCOUNTER — Encounter: Payer: Self-pay | Admitting: Family Medicine

## 2020-04-06 ENCOUNTER — Telehealth (INDEPENDENT_AMBULATORY_CARE_PROVIDER_SITE_OTHER): Payer: Managed Care, Other (non HMO) | Admitting: Family Medicine

## 2020-04-06 VITALS — BP 124/84 | HR 110 | Temp 97.2°F | Resp 18 | Ht 68.0 in | Wt 355.0 lb

## 2020-04-06 DIAGNOSIS — K760 Fatty (change of) liver, not elsewhere classified: Secondary | ICD-10-CM

## 2020-04-06 DIAGNOSIS — J45901 Unspecified asthma with (acute) exacerbation: Secondary | ICD-10-CM | POA: Diagnosis not present

## 2020-04-06 DIAGNOSIS — Z5181 Encounter for therapeutic drug level monitoring: Secondary | ICD-10-CM

## 2020-04-06 DIAGNOSIS — I10 Essential (primary) hypertension: Secondary | ICD-10-CM | POA: Diagnosis not present

## 2020-04-06 MED ORDER — FLUTICASONE-SALMETEROL 250-50 MCG/DOSE IN AEPB
1.0000 | INHALATION_SPRAY | Freq: Two times a day (BID) | RESPIRATORY_TRACT | 5 refills | Status: DC
Start: 2020-04-06 — End: 2020-04-06

## 2020-04-06 MED ORDER — PREDNISONE 10 MG PO TABS
ORAL_TABLET | ORAL | 0 refills | Status: DC
Start: 2020-04-06 — End: 2020-07-23

## 2020-04-06 MED ORDER — FLUTICASONE-SALMETEROL 250-50 MCG/DOSE IN AEPB: 1.0000 | INHALATION_SPRAY | Freq: Two times a day (BID) | RESPIRATORY_TRACT | 5 refills | Status: DC

## 2020-04-06 MED ORDER — LISINOPRIL 10 MG PO TABS: 10.0000 mg | ORAL_TABLET | Freq: Every day | ORAL | 3 refills | Status: DC

## 2020-04-06 NOTE — Patient Instructions (Signed)
Christian Barrett-  The cheapest inhaler I can find would be advair and it was sent to [facility]- best price through [facility]  If you cannot afford this the only other option that I can find is sending in a inhaled steroid that you can use in your nebulizer - this would run $30-50 as well.   Go to the ER for chest pain and shortness of breath if it worsens at all. If you cannot speak more than a few words at a time, or you are short of breath and the nebs aren't helping go to the ER or call 911  Asthma Attack  Acute bronchospasm caused by asthma is also referred to as an asthma attack. Bronchospasm means that the air passages become narrowed or "tight," which limits the amount of oxygen that can get into the lungs. The narrowing is caused by inflammation and tightening of the muscles in the air tubes (bronchi) in the lungs. Excessive mucus is also produced, which narrows the airways more. This can cause trouble breathing, coughing, and loud breathing (wheezing). What are the causes? Possible triggers include:  Animal dander from the skin, hair, or feathers of animals.  Dust mites contained in house dust.  Cockroaches.  Pollen from trees or grass.  Mold.  Cigarette or tobacco smoke.  Air pollutants such as dust, household cleaners, hair sprays, aerosol sprays, paint fumes, strong chemicals, or strong odors.  Cold air or weather changes. Cold air may trigger inflammation. Winds increase molds and pollens in the air.  Strong emotions such as crying or laughing hard.  Stress.  Certain medicines, such as aspirin or beta-blockers.  Sulfites in foods and drinks, such as dried fruits and wine.  Infections or inflammatory conditions, such as a flu, a cold, pneumonia, or inflammation of the nasal membranes (rhinitis).  Gastroesophageal reflux disease (GERD). GERD is a condition in which stomach acid backs up into your esophagus, which can irritate nearby airway structures.  Exercise or  activity that requires a lot of energy. What are the signs or symptoms? Symptoms of this condition include:  Wheezing. This may sound like whistling while breathing. This may be more noticeable at night.  Excessive coughing, particularly at night.  Chest tightness or pain.  Shortness of breath.  Feeling like you cannot get enough air no matter how hard you try (air hunger). How is this diagnosed? This condition may be diagnosed based on:  Your medical history.  Your symptoms.  A physical exam.  Tests to check for other causes of your symptoms or other conditions that may have triggered your asthma attack. These tests may include: ? Chest X-ray. ? Blood tests. ? Specialized tests to assess lung function, such as breathing into a device that measures how much air you inhale and exhale (spirometry). How is this treated? The goal of treatment is to open the airways in your lungs and reduce inflammation. Most asthma attacks are treated with medicines that you inhale through a hand-held inhaler (metered dose inhaler, MDI) or a device that turns liquid medicine into a mist that you inhale (nebulizer). Medicines may include:  Quick relief or rescue medicines that relax the muscles of the bronchi. These medicines include bronchodilators, such as albuterol.  Controller medicines, such as inhaled corticosteroids. These are long-acting medicines that are used for daily asthma maintenance. If you have a moderate or severe asthma attack, you may be treated with steroid medicines by mouth or through an IV injection at the hospital. Steroid medicines reduce  inflammation in your lungs. Depending on the severity of your attack, you may need oxygen therapy to help you breathe. If your asthma attack was caused by a bacterial infection, such as pneumonia, you will be given antibiotic medicines. Follow these instructions at home: Medicines  Take over-the-counter and prescription medicines only as told  by your health care provider. Keep your medicines up-to-date and available.  If you are more than [redacted] weeks pregnant and you are prescribed any new medicines, tell your obstetrician about those medicines.  If you were prescribed an antibiotic medicine, take it as told by your health care provider. Do not stop taking the antibiotic even if you start to feel better. Avoiding triggers   Keep track of things that trigger your asthma attacks or cause you to have breathing problems, and avoid exposure to these triggers.  Do not use any products that contain nicotine or tobacco, such as cigarettes and e-cigarettes. If you need help quitting, ask your health care provider.  Avoid secondhand smoke.  Avoid strong smells, such as perfumes, aerosols, and cleaning solvents.  When pollen or air pollution is bad, keep windows closed and use an air conditioner or go to places with air conditioning. Asthma action plan  Work with your health care provider to make a written plan for managing and treating your asthma attacks (asthma action plan). This plan should include: ? A list of your asthma triggers and how to avoid them. ? Information about when your medicines should be taken and when their dosage should be changed. ? Instructions about using a device called a peak flow meter to monitor your condition. A peak flow meter measures how well your lungs are working and measures how severe your asthma is at a given time. Your "personal best" is the highest peak flow rate you can reach when you feel good and have no asthma symptoms. General instructions  Avoid excessive exercise or activity until your asthma attack resolves. Ask your health care provider what activities are safe for you and when you can return to your normal activities.  Stay up to date on all vaccinations recommended by your health care provider, such as flu and pneumonia vaccines.  Drink enough fluid to keep your urine clear or pale yellow.  Staying hydrated helps keep mucus in your lungs thin so it can be coughed up easily.  If you drink caffeine, do so in moderation.  Do not use alcohol until you have recovered.  Keep all follow-up visits as told by your health care provider. This is important. Asthma requires careful medical care, and you and your health care provider can work together to reduce the likelihood of future attacks. Contact a health care provider if:  Your peak flow reading is still at 50-79% of your personal best after you have followed your action plan for 1 hour. This is in the yellow zone, which means "caution."  You need to use a reliever medicine more than 2-3 times a week.  Your medicines are causing side effects, such as: ? Rash. ? Itching. ? Swelling. ? Trouble breathing.  Your symptoms do not improve after 48 hours.  You cough up mucus (sputum) that is thicker than usual.  You have a fever.  You need to use your medicines much more frequently than normal. Get help right away if:  Your peak flow reading is less than 50% of your personal best. This is in the red zone, which means "danger."  You have severe trouble breathing.  You develop chest pain or discomfort.  Your medicines no longer seem to be helping.  You vomit.  You cannot eat or drink without vomiting.  You are coughing up yellow, green, brown, or bloody mucus.  You have a fever and your symptoms suddenly get worse.  You have trouble swallowing.  You feel very tired, and breathing becomes tiring. Summary  Acute bronchospasm caused by asthma is also referred to as an asthma attack.  Bronchospasm is caused by narrowing or tightness in air passages, which causes shortness of breath, coughing, and loud breathing (wheezing).  Many things can trigger an asthma attack, such as allergens, weather changes, exercise, smoke, and other fumes.  Treatment for an asthma attack may include inhaled rescue medicines for immediate  relief, as well as the use of maintenance therapy.  Get help right away if you have worsening shortness of breath, chest pain, or fever, or if your home medicines are no longer helping with your symptoms. This information is not intended to replace advice given to you by your health care provider. Make sure you discuss any questions you have with your health care provider. Document Revised: 12/14/2018 Document Reviewed: 09/26/2016 Elsevier Patient Education  2020 [employer].

## 2020-04-06 NOTE — Telephone Encounter (Signed)
I explained to the pt  today that I had only seen him once for acute problems (multiple) and that he does need to come in for routine OV so that we can address his other Dx and look at meds/cost etc.    I have never gotten the messages back to me stating what costs inhalers were or that he had no insurance coverage to start with.  I had asked him to find out coverage from insurance and let me know.  Today I explained that I cannot do anything about his situation - if insurance doesn't cover the meds and he can't afford [facility] price, and there are no samples - I don't have any trick up to sleeve to get him what he needs.  I offered [facility] pharmacy consult so they could work on patient assistance applications - but I also explained that he can look those up himself as well.   He is seeing pulmonology - but apparently they aren't managing asthma?  Asher Muir checked for samples we have none  I looked and spent like 15 more minutes today after his 2 times talking to me today - and I sent in the lowest price meds I could find through [facility].  I did not say anything to him about f/up appt next week specifically for asthma - I didn't actually know when the appt was, and we did not discuss anything about COVID testing/vaccines/quarantine or anything today regarding f/up appt.  He should probably push his appt back until he can come in person  He wasn't taking most of his meds and he's upset about me not fixing all of his meds for him when we haven't discussed meds/had a visit for Dx/ or discussed at all his dx/problems/concerns.  There have been a few phone call messages/refill requests over the past month - mostly about inhalers - which never got resolved? And metformin which got sent in.    If the pt wanted me to call him and discuss all meds, he should have been prompted to make an appt to discuss- I told him same today - I cannot address everything in sick visits - sick visits are acute issues 1-2 new problems  takes up 20 min, needs routine OV separate and I also explained that inhalers and some meds are unfortunately expensive no matter what we do.
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
## 2020-04-06 NOTE — Telephone Encounter (Signed)
I don't know what to do about this patient?  I called to tell him about [place] having no inhalers for samples and that we sent in cheapest to pharmacy.  He went on saying he has been having so many problems with [facility].  He has an appt with Leisa on Tuesday for asthma.  When triaged by [facility] he states has wheezing, chest pain sore throat and coughing.  He was told he had to do virtual and now he is upset.  I told him per our protocol that if he answered yes to any of the questions we could not see him in office.  He came in today not making [place] aware of his symptoms to be triaged for his virtual visit today and vitals which I done.  Pt is persistent that he comes in and that he has had both vaccines and has been quarantined not left house and that it is only asthma and he made leisa aware of all his symptoms.  Just wanted to give a heads up.  Are you willing to see him in office or does he need virtual?

## 2020-04-09 ENCOUNTER — Ambulatory Visit: Payer: Self-pay | Admitting: Internal Medicine

## 2020-04-10 ENCOUNTER — Ambulatory Visit: Payer: Self-pay | Admitting: Family Medicine

## 2020-04-10 ENCOUNTER — Telehealth (INDEPENDENT_AMBULATORY_CARE_PROVIDER_SITE_OTHER): Payer: Managed Care, Other (non HMO) | Admitting: Family Medicine

## 2020-04-10 DIAGNOSIS — Z5329 Procedure and treatment not carried out because of patient's decision for other reasons: Secondary | ICD-10-CM

## 2020-04-10 DIAGNOSIS — Z91199 Patient's noncompliance with other medical treatment and regimen due to unspecified reason: Secondary | ICD-10-CM

## 2020-04-19 ENCOUNTER — Other Ambulatory Visit: Payer: Self-pay

## 2020-04-19 ENCOUNTER — Ambulatory Visit: Payer: Managed Care, Other (non HMO) | Attending: Neurology | Admitting: Physical Therapy

## 2020-04-19 ENCOUNTER — Encounter: Payer: Self-pay | Admitting: Physical Therapy

## 2020-04-19 DIAGNOSIS — M25562 Pain in left knee: Secondary | ICD-10-CM | POA: Insufficient documentation

## 2020-04-19 DIAGNOSIS — M6281 Muscle weakness (generalized): Secondary | ICD-10-CM | POA: Diagnosis present

## 2020-04-19 DIAGNOSIS — M79641 Pain in right hand: Secondary | ICD-10-CM | POA: Insufficient documentation

## 2020-04-19 DIAGNOSIS — G8929 Other chronic pain: Secondary | ICD-10-CM

## 2020-04-19 DIAGNOSIS — R269 Unspecified abnormalities of gait and mobility: Secondary | ICD-10-CM | POA: Insufficient documentation

## 2020-04-19 DIAGNOSIS — M25561 Pain in right knee: Secondary | ICD-10-CM | POA: Insufficient documentation

## 2020-04-19 DIAGNOSIS — R200 Anesthesia of skin: Secondary | ICD-10-CM | POA: Diagnosis present

## 2020-04-19 DIAGNOSIS — M79642 Pain in left hand: Secondary | ICD-10-CM | POA: Diagnosis present
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
## 2020-04-19 NOTE — Progress Notes (Deleted)
Patient is a 34 year old male Last couple of visits at [facility] was with Danelle Berry with those notes reviewed and a lot of concerns noted in those notes. He follows up today.  Hypertension:  Medication regimen- lisinopril 10 mg (says he's been on for years), diltiazem 240 mg daily, lasix Takes medications Does not check his blood pressure at home (states due to needing a large cuff) BP Readings from Last 3 Encounters:  04/06/20 124/84  03/05/20 132/82  12/22/19 (!) 169/100    Asthma  Last visit it was noted that pt was not on any inhalers, could not afford, states trelegy coupon only helped once, he cannot affort albuterol rescue inhaler, he is doing duo nebs multiple times a day - not helping with SOB - he is wheezy and SOB, can only walk short distances, he notes he is worse with heat and weather changes, worse recently.   Last vist note:  Today he reports being out of diltiazem, lasix, off all allergy and asthma meds except duoneb.   He reports today that he is very short of breath with walking short distances, he feels tight in his chest endorses chest pain and even pain radiating from his chest up to his head that is much worse with exertion.  He notes that he has seen cardiology and that he has fluid around his heart and in his lungs he is not taking his diltiazem and has not taken a Lasix.   He did come into clinic this morning and blood pressure was checked at that time which was normal and he was mildly tachycardic.  He is concerned about his rapid heart rate which he has had multiple times before.   Regarding his blood pressure he is concerned that he gets some pressure and throbbing behind his eyes.  Last night he took 40 mg of lisinopril that was his wife's medication he also has only been at one time.  Reviewed his last echo which was February 2021 showed left ventricular ejection fraction 60 to 65% had no pericardial effusion Reviewed his vital signs-heart rate ranges 88-110  (again off cardizem)   There are numerous concerns about messages that the patient sent that were expressed and Lisa's last note were reviewed.  pt sent mychart messages today stating that he cannot afford meds and that he told us he has no insurance.   I reviewed messages and discussed with pt I have seen him once previously for acute issues - scrotal pain and asthma exacerbation - I refilled his then current meds but did not have time to do routine OV as well.  He was told to check prices and insruance coverage and let us know cost/preferred meds, and that was over a month ago, I have not since 03/05/2020 received any info from pt as to what happened  Pt message today in between calls to pt: Not sure what happened. I thought I had 20 min appointment and you dropped after coming on late. I was gonna ask about blood pressure I took the 40 mg because my chest is always hurting, my eyes just started to water and hurt for the last two weeks more and more, and on top of that I sent messages to you since July 1st about my depression. I don't know what messages are getting to you though it said a referral was requested for my mental health though it was never sent since the start of July. I am still annoyed that I told you I  have no prescription insurance and the meds were $2500 a month. When I called they said you told me to call my insurance company. When I told them I told you I didn't have insurance they said then ask the pharmacist. The pharmacist said this was very unprofessional but she would fax affordable meds that would work.  Refill request received on 03/20/2020 for metformin - I cannot see any context but generic metformin XR was sent in.  Message received on 03/07/2020 "Pharmacy called in stating the prescription Glumetza is expensive for patient and they would like permission to provide Metformin Glucophage XR extend relief 24hr 500mg  .please advise"  I authorized med change to lower cost  med.  Pt sent an additional note connected to 03/07/2020 refill request: "Patient is calling again to request that the nurse or doctor call him regarding getting his medication at a less expensive cost since he does not have insurance. Please call patient to discuss at [phone]"  Routing hx: CG  1:57 PM [phone] P routed this conversation to Limmie Patricia, CMA     1:46 PM [name] routed this conversation to Tamela Oddi  [employer]    1:46 PM Note Patient is calling again to request that the nurse or doctor call him regarding getting his medication at a less expensive cost since he does not have insurance. Please call patient to discuss at [phone]      March 20, 2020   Earlier messages from pt on 03/05/2020 following his office visit: 03/07/2020, CMA at 03/06/2020 10:41 AM  Status: Signed  Called patient back and told him that we still would not know how much they cost, he would still need to call pharmacy and ask and see what he was willing to pay and let 03/08/2020 know.    [place] A at 03/06/2020 10:26 AM  Status: Signed  Patient called back to speak to 03/08/2020 states that his insurance is only medical and that he does not have medication coverage. He is asking if there is something that can be prescribed that will not cost him a lot because $2000 is way too much for him to manage. Please advise    Asher Muir, CMA at 03/06/2020 10:22 AM  Status: Signed  Left detailed vm    03/08/2020, CMA at 03/05/2020 4:59 PM  Status: Signed  Im assuming trellegy?    03/07/2020 at 03/05/2020 4:20 PM  Status: Signed  Pt called stating that the medication that was prescribed for him is going to cost him $2000+. Pt is requesting to have other medications sent in for him that would be cheaper. Please advise.     [facility]07[id number], [place] - [address]  [address] [place]   [phone] Fax:[phone]  Hours: Not open 24 hours          Pt endorsed several concerning additional sx at the end of our second time talking today -he only then this afternoon endorse being out of all of his other medications endorsed chest pain that is worse when walking endorse a history of some fluid around his heart and in his lungs he is not taking medications per cardiology, he asked about taking Lasix, encouraged him to restart his diltiazem, try 2 to 3 days of Lasix with potassium supplement.  I explained to him that I do not know if he his having only asthma wheeze or if he is having  a cardiac wheeze due to his complicated history which is new to me and I have never seen him before.  I am concerned that he has another severe asthma exacerbation with uncontrolled baseline asthma which she states is worse with the heat, with change in seasons and he is planning to go out of town this weekend to stay with somebody who smokes with which also exacerbates his asthma.  He was encouraged to start the steroids, start all of his other allergy and asthma medications, I have looked for the past 10-15 minutes at all the cash prices for all inhalers for asthma -Advair seems to be the cheapest inhaler I could find for him to have long-acting ingredients.  If he cannot afford Advair I did offer to send in budesonide vials but these are also similarly expensive based on cash pricing.   He currently has duo nebs and is using them several times a day but that is not helping his symptoms.  I explained that if he has any worsening that he currently is he needs to go to the ER to be stabilized I did warn of the serious nature of asthma disease how it can cause mortality and morbidity, and he should not delay presented to the ER.  For now I believe his blood pressure is stable-he should continue the same lisinopril dose due to having multiple normal readings in the past couple months, and he  has several reasons to have increased blood pressure intermittently.  He is concerned about his tachycardia however he has been mildly tachycardic for a long time and is currently extremely short of breath and wheezy explained to him again that I am not concerned about his heart rate being 100-110 -I asked that we get his breathing managed and under control and that he present later for an office visit to address and work-up tachycardia.  He has multiple other concerns about medication cost which he has sent through my chart messages, unfortunately he has not been into clinic for his routine conditions or discussed these with me.  Encouraged him to get another routine follow-up appointment so that we can address his chronic conditions and medical management at that time.

## 2020-04-20 ENCOUNTER — Telehealth: Payer: Self-pay | Admitting: Family Medicine

## 2020-04-20 ENCOUNTER — Ambulatory Visit: Payer: Managed Care, Other (non HMO) | Admitting: Family Medicine

## 2020-04-20 NOTE — Telephone Encounter (Signed)
Spoke with patient and informed him that we needed to cancel his appointment with Dr. Dorris Fetch on 04/23/20 due to provider not feeling comfortable.  Patient stated that he would find somewhere else for care due to not wanting to see previous PCP [facility].

## 2020-04-23 ENCOUNTER — Encounter: Payer: Self-pay | Admitting: Emergency Medicine

## 2020-04-23 ENCOUNTER — Encounter: Payer: Self-pay | Admitting: Family Medicine

## 2020-04-23 ENCOUNTER — Ambulatory Visit: Payer: Managed Care, Other (non HMO) | Admitting: Internal Medicine

## 2020-04-23 ENCOUNTER — Emergency Department: Payer: Managed Care, Other (non HMO)

## 2020-04-23 DIAGNOSIS — Z7951 Long term (current) use of inhaled steroids: Secondary | ICD-10-CM | POA: Diagnosis not present

## 2020-04-23 DIAGNOSIS — R0602 Shortness of breath: Secondary | ICD-10-CM | POA: Diagnosis present

## 2020-04-23 DIAGNOSIS — Z79899 Other long term (current) drug therapy: Secondary | ICD-10-CM | POA: Diagnosis not present

## 2020-04-23 DIAGNOSIS — Z20822 Contact with and (suspected) exposure to covid-19: Secondary | ICD-10-CM | POA: Diagnosis not present

## 2020-04-23 DIAGNOSIS — J45901 Unspecified asthma with (acute) exacerbation: Secondary | ICD-10-CM | POA: Diagnosis not present

## 2020-04-23 DIAGNOSIS — E109 Type 1 diabetes mellitus without complications: Secondary | ICD-10-CM | POA: Insufficient documentation

## 2020-04-23 DIAGNOSIS — I10 Essential (primary) hypertension: Secondary | ICD-10-CM | POA: Diagnosis not present

## 2020-04-23 DIAGNOSIS — Z7984 Long term (current) use of oral hypoglycemic drugs: Secondary | ICD-10-CM | POA: Insufficient documentation

## 2020-04-23 LAB — BASIC METABOLIC PANEL
Anion gap: 11 (ref 5–15)
BUN: 16 mg/dL (ref 6–20)
CO2: 26 mmol/L (ref 22–32)
Calcium: 8.9 mg/dL (ref 8.9–10.3)
Chloride: 99 mmol/L (ref 98–111)
Creatinine, Ser: 0.63 mg/dL (ref 0.61–1.24)
GFR calc Af Amer: >60 mL/min (ref >60)
GFR calc non Af Amer: >60 mL/min (ref >60)
Glucose, Bld: 130 mg/dL — ABNORMAL HIGH (ref 70–99)
Potassium: 4 mmol/L (ref 3.5–5.1)
Sodium: 136 mmol/L (ref 135–145)

## 2020-04-23 LAB — CBC
HCT: 41.6 % (ref 39.0–52.0)
Hemoglobin: 13.8 g/dL (ref 13.0–17.0)
MCH: 27.3 pg (ref 26.0–34.0)
MCHC: 33.2 g/dL (ref 30.0–36.0)
MCV: 82.4 fL (ref 80.0–100.0)
Platelets: 281 10*3/uL (ref 150–400)
RBC: 5.05 MIL/uL (ref 4.22–5.81)
RDW: 13.5 % (ref 11.5–15.5)
WBC: 13.2 10*3/uL — ABNORMAL HIGH (ref 4.0–10.5)
nRBC: 0 % (ref 0.0–0.2)

## 2020-04-23 LAB — TROPONIN I (HIGH SENSITIVITY): Troponin I (High Sensitivity): <2 ng/L (ref <18)

## 2020-04-23 NOTE — ED Triage Notes (Signed)
Pt c/o increased SOB over the last 24 hours. Pt has asthma and was seen by PCP x1 week ago, placed on Prednisone. O2 in triage is 100% RA.

## 2020-04-23 NOTE — Progress Notes (Signed)
No show - pt cancelled appt  He was encouraged to come in when well for routine f/up on his chronic conditions

## 2020-04-24 ENCOUNTER — Emergency Department: Payer: Managed Care, Other (non HMO)

## 2020-04-24 ENCOUNTER — Emergency Department
Admission: EM | Admit: 2020-04-24 | Discharge: 2020-04-24 | Disposition: A | Discharge status: Home / Self Care | Payer: Managed Care, Other (non HMO) | Attending: Emergency Medicine | Admitting: Emergency Medicine

## 2020-04-24 DIAGNOSIS — J45901 Unspecified asthma with (acute) exacerbation: Secondary | ICD-10-CM

## 2020-04-24 DIAGNOSIS — J40 Bronchitis, not specified as acute or chronic: Secondary | ICD-10-CM

## 2020-04-24 LAB — TROPONIN I (HIGH SENSITIVITY): Troponin I (High Sensitivity): <2 ng/L (ref <18)

## 2020-04-24 LAB — BRAIN NATRIURETIC PEPTIDE: B Natriuretic Peptide: 33.1 pg/mL (ref 0.0–100.0)

## 2020-04-24 LAB — RESPIRATORY PANEL BY RT PCR (FLU A&B, COVID)
Influenza A by PCR: NEGATIVE
Influenza B by PCR: NEGATIVE
SARS Coronavirus 2 by RT PCR: NEGATIVE

## 2020-04-24 MED ORDER — METHYLPREDNISOLONE SODIUM SUCC 125 MG IJ SOLR
125.0000 mg | Freq: Once | INTRAMUSCULAR | Status: AC
  Administered 2020-04-24: 125 mg via INTRAVENOUS
  Filled 2020-04-24: qty 2

## 2020-04-24 MED ORDER — AZITHROMYCIN 500 MG PO TABS
500.0000 mg | ORAL_TABLET | Freq: Once | ORAL | Status: AC
  Administered 2020-04-24: 500 mg via ORAL
  Filled 2020-04-24: qty 1

## 2020-04-24 MED ORDER — IOHEXOL 350 MG/ML SOLN
75.0000 mL | Freq: Once | INTRAVENOUS | Status: AC | PRN
  Administered 2020-04-24: 75 mL via INTRAVENOUS

## 2020-04-24 MED ORDER — PREDNISONE 20 MG PO TABS: 60.0000 mg | ORAL_TABLET | Freq: Every day | ORAL | 0 refills | Status: AC

## 2020-04-24 MED ORDER — IPRATROPIUM-ALBUTEROL 0.5-2.5 (3) MG/3ML IN SOLN
3.0000 mL | Freq: Once | RESPIRATORY_TRACT | Status: AC
  Administered 2020-04-24: 3 mL via RESPIRATORY_TRACT

## 2020-04-24 MED ORDER — AZITHROMYCIN 250 MG PO TABS
ORAL_TABLET | ORAL | 0 refills | Status: DC
Start: 2020-04-24 — End: 2020-04-24

## 2020-04-24 MED ORDER — AZITHROMYCIN 250 MG PO TABS: ORAL_TABLET | ORAL | 0 refills | Status: DC

## 2020-04-24 MED ORDER — IPRATROPIUM-ALBUTEROL 0.5-2.5 (3) MG/3ML IN SOLN
3.0000 mL | Freq: Once | RESPIRATORY_TRACT | Status: AC
  Administered 2020-04-24: 3 mL via RESPIRATORY_TRACT
  Filled 2020-04-24: qty 3

## 2020-04-24 MED ORDER — ACETAMINOPHEN 500 MG PO TABS
1000.0000 mg | ORAL_TABLET | Freq: Once | ORAL | Status: AC
  Administered 2020-04-24: 1000 mg via ORAL
  Filled 2020-04-24: qty 2

## 2020-04-24 MED ORDER — BUDESONIDE 90 MCG/ACT IN AEPB: 1.0000 | INHALATION_SPRAY | Freq: Two times a day (BID) | RESPIRATORY_TRACT | 1 refills | Status: DC

## 2020-04-24 MED ORDER — AZITHROMYCIN 250 MG PO TABS
ORAL_TABLET | ORAL | 0 refills | Status: DC
Start: 2020-04-24 — End: 2020-08-29

## 2020-04-24 NOTE — ED Notes (Signed)
Patient transported to CT 

## 2020-04-24 NOTE — Discharge Instructions (Signed)
Use the steroid inhaler twice a day everyday to prevent asthma exacerbation. Use your rescue inhaler as needed for acute shortness of breath or wheezing.  Take oral steroids for the next 4 days.  Follow-up with your pulmonologist.  Return to the emergency room for fever, new or worsening shortness of breath or chest pain.

## 2020-04-24 NOTE — ED Notes (Signed)
Pt requests tylenol for HA
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
## 2020-04-25 ENCOUNTER — Encounter: Payer: Self-pay | Admitting: Family Medicine

## 2020-05-21 ENCOUNTER — Ambulatory Visit: Payer: Managed Care, Other (non HMO) | Attending: Neurology | Admitting: Physical Therapy

## 2020-05-23 ENCOUNTER — Ambulatory Visit: Payer: Managed Care, Other (non HMO) | Admitting: Physical Therapy

## 2020-05-28 ENCOUNTER — Ambulatory Visit: Payer: Managed Care, Other (non HMO) | Admitting: Physical Therapy

## 2020-05-30 ENCOUNTER — Ambulatory Visit: Payer: Managed Care, Other (non HMO) | Admitting: Physical Therapy
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
## 2020-06-04 ENCOUNTER — Ambulatory Visit: Payer: Self-pay | Admitting: Family Medicine

## 2020-06-05 ENCOUNTER — Ambulatory Visit: Payer: Managed Care, Other (non HMO) | Admitting: Physical Therapy

## 2020-06-05 ENCOUNTER — Ambulatory Visit: Payer: Managed Care, Other (non HMO) | Admitting: Family Medicine

## 2020-06-06 ENCOUNTER — Ambulatory Visit: Payer: Managed Care, Other (non HMO) | Admitting: Physical Therapy

## 2020-06-07 ENCOUNTER — Ambulatory Visit: Payer: Managed Care, Other (non HMO) | Admitting: Physical Therapy

## 2020-06-11 ENCOUNTER — Ambulatory Visit: Payer: Managed Care, Other (non HMO) | Attending: Neurology | Admitting: Physical Therapy

## 2020-06-11 ENCOUNTER — Ambulatory Visit: Payer: Managed Care, Other (non HMO) | Admitting: Physical Therapy

## 2020-06-13 ENCOUNTER — Ambulatory Visit: Payer: Managed Care, Other (non HMO) | Admitting: Physical Therapy

## 2020-06-18 ENCOUNTER — Ambulatory Visit: Payer: Managed Care, Other (non HMO) | Admitting: Physical Therapy

## 2020-06-20 ENCOUNTER — Ambulatory Visit: Payer: Managed Care, Other (non HMO) | Admitting: Physical Therapy

## 2020-06-25 ENCOUNTER — Telehealth: Payer: Managed Care, Other (non HMO) | Admitting: Emergency Medicine

## 2020-06-25 ENCOUNTER — Ambulatory Visit: Payer: Managed Care, Other (non HMO) | Admitting: Physical Therapy

## 2020-06-25 ENCOUNTER — Ambulatory Visit: Payer: Self-pay

## 2020-06-25 DIAGNOSIS — J309 Allergic rhinitis, unspecified: Secondary | ICD-10-CM | POA: Diagnosis not present

## 2020-06-25 MED ORDER — ALBUTEROL SULFATE HFA 108 (90 BASE) MCG/ACT IN AERS
2.0000 | INHALATION_SPRAY | RESPIRATORY_TRACT | 3 refills | Status: DC | PRN
Start: 2020-06-25 — End: 2020-08-29

## 2020-06-25 MED ORDER — LEVOCETIRIZINE DIHYDROCHLORIDE 5 MG PO TABS: 5.0000 mg | ORAL_TABLET | Freq: Every evening | ORAL | 0 refills | Status: DC

## 2020-06-25 MED ORDER — FLUTICASONE PROPIONATE 50 MCG/ACT NA SUSP
2.0000 | Freq: Every day | NASAL | 0 refills | Status: DC
Start: 2020-06-25 — End: 2020-08-29

## 2020-06-25 NOTE — Progress Notes (Signed)
E visit for Allergic Rhinitis We are sorry that you are not feeling well.  Here is how we plan to help!  Based on what you have shared with me it looks like you have Allergic Rhinitis.  Rhinitis is when a reaction occurs that causes nasal congestion, runny nose, sneezing, and itching.  Most types of rhinitis are caused by an inflammation and are associated with symptoms in the eyes ears or throat. There are several types of rhinitis.  The most common are acute rhinitis, which is usually caused by a viral illness, allergic or seasonal rhinitis, and nonallergic or year-round rhinitis.  Nasal allergies occur certain times of the year.  Allergic rhinitis is caused when allergens in the air trigger the release of histamine in the body.  Histamine causes itching, swelling, and fluid to build up in the fragile linings of the nasal passages, sinuses and eyelids.  An itchy nose and clear discharge are common.  I recommend the following over the counter treatments: Xyzal 5 mg take 1 tablet daily, discontinue claritin.  I also would recommend a nasal spray: Flonase 2 sprays into each nostril once daily  You may also benefit from eye drops such as: Systane 1-2 driops each eye twice daily as needed available over-the-counter.  I've also sent an inhaler to the pharmacy.  HOME CARE:   You can use an over-the-counter saline nasal spray as needed  Avoid areas where there is heavy dust, mites, or molds  Stay indoors on windy days during the pollen season  Keep windows closed in home, at least in bedroom; use air conditioner.  Use high-efficiency house air filter  Keep windows closed in car, turn AC on re-circulate  Avoid playing out with dog during pollen season  GET HELP RIGHT AWAY IF:   If your symptoms do not improve within 10 days  You become short of breath  You develop yellow or green discharge from your nose for over 3 days  You have coughing fits  MAKE SURE YOU:   Understand  these instructions  Will watch your condition  Will get help right away if you are not doing well or get worse  Thank you for choosing an e-visit. Your e-visit answers were reviewed by a board certified advanced clinical practitioner to complete your personal care plan. Depending upon the condition, your plan could have included both over the counter or prescription medications. Please review your pharmacy choice. Be sure that the pharmacy you have chosen is open so that you can pick up your prescription now.  If there is a problem you may message your provider in MyChart to have the prescription routed to another pharmacy. Your safety is important to [place]. If you have drug allergies check your prescription carefully.  For the next 24 hours, you can use MyChart to ask questions about today's visit, request a non-urgent call back, or ask for a work or school excuse from your e-visit provider. You will get an email in the next two days asking about your experience. I hope that your e-visit has been valuable and will speed your recovery.        Approximately 5 minutes was used in reviewing the patient's chart, questionnaire, prescribing medications, and documentation.

## 2020-06-25 NOTE — Telephone Encounter (Signed)
Patient called stating that he has had sinus congestion and bloody noses for 3 weeks He states his whole face is swollen. He is unable to breath out of his nose. He denies fever and has had multiple COVID-19 test which have been negative Last was last week per patient report. He has ear pain  sore throat and headache. Per protocol patient will go to UC for evaluation. No appointment available with office. Care advice read to patient. He verbalized understanding of all information.  Reason for Disposition . [1] SEVERE pain AND [2] not improved 2 hours after pain medicine  Answer Assessment - Initial Assessment Questions 1. LOCATION: "Where does it hurt?"      Whole face 2. ONSET: "When did the sinus pain start?"  (e.g., hours, days)      3 weeks ago 3. SEVERITY: "How bad is the pain?"   (Scale 1-10; mild, moderate or severe)   - MILD (1-3): doesn't interfere with normal activities    - MODERATE (4-7): interferes with normal activities (e.g., work or school) or awakens from sleep   - SEVERE (8-10): excruciating pain and patient unable to do any normal activities        moderate 4. RECURRENT SYMPTOM: "Have you ever had sinus problems before?" If Yes, ask: "When was the last time?" and "What happened that time?"     Yes every season 5. NASAL CONGESTION: "Is the nose blocked?" If Yes, ask: "Can you open it or must you breathe through the mouth?"     Moth breathing 6. NASAL DISCHARGE: "Do you have discharge from your nose?" If so ask, "What color?"     Bloody, clear 7. FEVER: "Do you have a fever?" If Yes, ask: "What is it, how was it measured, and when did it start?"     No 8. OTHER SYMPTOMS: "Do you have any other symptoms?" (e.g., sore throat, cough, earache, difficulty breathing)     Sore throat, ear pain 9. PREGNANCY: "Is there any chance you are pregnant?" "When was your last menstrual period?"     N/A  Protocols used: SINUS PAIN OR CONGESTION-A-AH

## 2020-06-26 NOTE — Progress Notes (Signed)
Duplicate Entry

## 2020-06-27 ENCOUNTER — Ambulatory Visit: Payer: Managed Care, Other (non HMO) | Admitting: Physical Therapy

## 2020-06-28 NOTE — Telephone Encounter (Signed)
Per patient chart had OV for aacute bacterial sinusitis with [facility] 10/ 18/21.

## 2020-07-18 ENCOUNTER — Telehealth: Payer: Self-pay | Admitting: Internal Medicine

## 2020-07-18 NOTE — Telephone Encounter (Signed)
Received records request from [facility], forwarded to medical records for processing.  07/18/20 [facility]

## 2020-07-23 ENCOUNTER — Telehealth: Payer: Managed Care, Other (non HMO) | Admitting: Family

## 2020-07-23 DIAGNOSIS — J4551 Severe persistent asthma with (acute) exacerbation: Secondary | ICD-10-CM

## 2020-07-23 MED ORDER — ALBUTEROL SULFATE (2.5 MG/3ML) 0.083% IN NEBU: 2.5000 mg | INHALATION_SOLUTION | Freq: Four times a day (QID) | RESPIRATORY_TRACT | 1 refills | Status: DC | PRN

## 2020-07-23 MED ORDER — PREDNISONE 20 MG PO TABS: 40.0000 mg | ORAL_TABLET | Freq: Every day | ORAL | 0 refills | Status: AC

## 2020-07-23 NOTE — Progress Notes (Signed)
Visit for Asthma  Based on what you have shared with me, it looks like you may have a flare up of your asthma.  Asthma is a chronic (ongoing) lung disease which results in airway obstruction, inflammation and hyper-responsiveness.   Asthma symptoms vary from person to person, with common symptoms including nighttime awakening and decreased ability to participate in normal activities as a result of shortness of breath. It is often triggered by changes in weather, changes in the season, changes in air temperature, or inside (home, school, daycare or work) allergens such as animal dander, mold, mildew, woodstoves or cockroaches.   It can also be triggered by hormonal changes, extreme emotion, physical exertion or an upper respiratory tract illness.     It is important to identify the trigger, and then eliminate or avoid the trigger if possible.   If you have been prescribed medications to be taken on a regular basis, it is important to follow the asthma action plan and to follow guidelines to adjust medication in response to increasing symptoms of decreased peak expiratory flow rate  Treatment: I have prescribed: Albuterol (Proventil) (2.5 mg in 3 mL) 0.083 % Take by nebulization solution every six hours as needed for wheezing or shortness of breath and Prednisone 40mg  by mouth per day for 7 days.   I also recommend that you are COVID tested and you need to follow up with your PCP in the next 1-2 days.   HOME CARE . Only take medications as instructed by your medical team. . Consider wearing a mask or scarf to improve breathing air temperature have been shown to decrease irritation and decrease exacerbations . Get rest. . Taking a steamy shower or using a humidifier may help nasal congestion sand ease sore throat pain. You can place a towel over your head and breathe in the steam from  hot water coming from a faucet. . Using a saline nasal spray works much the same way.  . Cough drops, hare candies and sore throat lozenges may ease your cough.  . Avoid close contacts especially the very you and the elderly . Cover your mouth if you cough or sneeze . Always remember to wash your hands.    GET HELP RIGHT AWAY IF: . You develop worsening symptoms; breathlessness at rest, drowsy, confused or agitated, unable to speak in full sentences . You have coughing fits . You develop a severe headache or visual changes . You develop shortness of breath, difficulty breathing or start having chest pain . Your symptoms persist after you have completed your treatment plan . If your symptoms do not improve within 10 days  MAKE SURE YOU . Understand these instructions. . Will watch your condition. . Will get help right away if you are not doing well or get worse.   Your e-visit answers were reviewed by a board certified advanced clinical practitioner to complete your personal care plan, Depending upon the condition, your plan could have included both over the counter or prescription medications.  Please review your pharmacy choice. Your safety is important to . If you have drug allergies check your prescription carefully. You can use MyChart to ask questions about today's visit, request a non-urgent call back, or ask for a work or school excuse for 24 hours related to this e-Visit. If it has been greater than 24 hours you will need to follow up with your provider, or enter a new e-Visit to address those concerns.  You will get an e-mail  in the next two days asking about your experience. I hope that your e-visit has been valuable and will speed your recovery. Thank you for using e-visits.  Approximately 5 minutes was spent documenting and reviewing patient's chart.

## 2020-08-02 ENCOUNTER — Telehealth: Payer: Self-pay | Admitting: Family

## 2020-08-02 DIAGNOSIS — R197 Diarrhea, unspecified: Secondary | ICD-10-CM

## 2020-08-02 MED ORDER — BENZONATATE 100 MG PO CAPS
100.0000 mg | ORAL_CAPSULE | Freq: Three times a day (TID) | ORAL | 0 refills | Status: DC | PRN
Start: 2020-08-02 — End: 2020-08-29

## 2020-08-02 MED ORDER — ONDANSETRON HCL 4 MG PO TABS: 4.0000 mg | ORAL_TABLET | Freq: Three times a day (TID) | ORAL | 0 refills | Status: DC | PRN

## 2020-08-02 NOTE — Progress Notes (Signed)
E-Visit for Corona Virus Screening  Your current symptoms could be consistent with the coronavirus.  Many health care providers can now test patients at their office but not all are.  [place] has multiple testing sites. For information on our COVID testing locations and hours go to [email or web address]  We are enrolling you in our MyChart Home Monitoring for COVID19 . Daily you will receive a questionnaire within the MyChart website. Our COVID 19 response team will be monitoring your responses daily.  Testing Information: The COVID-19 Community Testing sites are testing BY APPOINTMENT ONLY.  You can schedule online at [email or web address]  If you do not have access to a smart phone or computer you may call [phone] for an appointment.   Additional testing sites in the Community:  . For [facility] Testing sites in [employer]  [email or web address]  . For [facility] testing sites in [place]  [email or web address]  . For [email or web address]  . For [facility] testing in [place] and [email or web address]  . For [facility] testing in [facility]   [email or web address]  For  more information about community testing call [phone]   Please quarantine yourself while awaiting your test results. Please stay home for a minimum of 10 days from the first day of illness with improving symptoms and you have had 24 hours of no fever (without the use of Tylenol (Acetaminophen) Motrin (Ibuprofen) or any fever reducing medication).  Also - Do not get tested prior to returning to work because once you have had a positive test the test can stay  positive for more than a month in some cases.   You should wear a mask or cloth face covering over your nose and mouth if you must be around other people or animals, including pets (even at home). Try to stay at least 6 feet away from other people. This will protect the people around you.  Please continue good preventive care measures, including:  frequent hand-washing, avoid touching your face, cover coughs/sneezes, stay out of crowds and keep a 6 foot distance from others.  COVID-19 is a respiratory illness with symptoms that are similar to the flu. Symptoms are typically mild to moderate, but there have been cases of severe illness and death due to the virus.   The following symptoms may appear 2-14 days after exposure: . Fever . Cough . Shortness of breath or difficulty breathing . Chills . Repeated shaking with chills . Muscle pain . Headache . Sore throat . New loss of taste or smell . Fatigue . Congestion or runny nose . Nausea or vomiting . Diarrhea  Go to the nearest hospital ED for assessment if fever/cough/breathlessness are severe or illness seems like a threat to life.  It is vitally important that if you feel that you have an infection such as this virus or any other virus that you stay home and away from places where you may spread it to others.  You should avoid contact with people age 52 and older.   You can use medication such as A prescription cough medication called Tessalon Perles 100 mg. You may take 1-2 capsules every 8 hours as needed for cough and I have also sent in Zofran for nausea.   You may also take acetaminophen (Tylenol) as needed for fever.  Reduce your risk of any infection by using the same precautions used for avoiding the common cold or flu:  Marland Kitchen Wash your hands often with soap and warm water for at  least 20 seconds.  If soap and water are not readily available, use an alcohol-based hand sanitizer with at least 60% alcohol.  . If coughing or sneezing,  cover your mouth and nose by coughing or sneezing into the elbow areas of your shirt or coat, into a tissue or into your sleeve (not your hands). . Avoid shaking hands with others and consider head nods or verbal greetings only. . Avoid touching your eyes, nose, or mouth with unwashed hands.  . Avoid close contact with people who are sick. . Avoid places or events with large numbers of people in one location, like concerts or sporting events. . Carefully consider travel plans you have or are making. . If you are planning any travel outside or inside the [place], visit the CDC's Travelers' Health webpage for the latest health notices. . If you have some symptoms but not all symptoms, continue to monitor at home and seek medical attention if your symptoms worsen. . If you are having a medical emergency, call 911.  HOME CARE . Only take medications as instructed by your medical team. . Drink plenty of fluids and get plenty of rest. . A steam or ultrasonic humidifier can help if you have congestion.   GET HELP RIGHT AWAY IF YOU HAVE EMERGENCY WARNING SIGNS** FOR COVID-19. If you or someone is showing any of these signs seek emergency medical care immediately. Call 911 or proceed to your closest emergency facility if: . You develop worsening high fever. . Trouble breathing . Bluish lips or face . Persistent pain or pressure in the chest . New confusion . Inability to wake or stay awake . You cough up blood. . Your symptoms become more severe  **This list is not all possible symptoms. Contact your medical provider for any symptoms that are sever or concerning to you.  MAKE SURE YOU   Understand these instructions.  Will watch your condition.  Will get help right away if you are not doing well or get worse.  Your e-visit answers were reviewed by a board certified advanced clinical practitioner to complete your personal care plan.  Depending on the condition, your plan could have included both  over the counter or prescription medications.  If there is a problem please reply once you have received a response from your provider.  Your safety is important to [place].  If you have drug allergies check your prescription carefully.    You can use MyChart to ask questions about today's visit, request a non-urgent call back, or ask for a work or school excuse for 24 hours related to this e-Visit. If it has been greater than 24 hours you will need to follow up with your provider, or enter a new e-Visit to address those concerns. You will get an e-mail in the next two days asking about your experience.  I hope that your e-visit has been valuable and will speed your recovery. Thank you for using e-visits.  Approximately 5 minutes was spent documenting and reviewing patient's chart.

## 2020-08-27 ENCOUNTER — Other Ambulatory Visit: Payer: Self-pay

## 2020-08-27 ENCOUNTER — Emergency Department: Payer: Self-pay

## 2020-08-27 ENCOUNTER — Telehealth: Payer: Self-pay | Admitting: Family

## 2020-08-27 ENCOUNTER — Inpatient Hospital Stay
Admission: EM | Admit: 2020-08-27 | Discharge: 2020-08-29 | DRG: 202 | Disposition: A | Discharge status: Home / Self Care | Payer: Self-pay | Attending: Family Medicine | Admitting: Family Medicine

## 2020-08-27 ENCOUNTER — Encounter: Payer: Self-pay | Admitting: Emergency Medicine

## 2020-08-27 DIAGNOSIS — F319 Bipolar disorder, unspecified: Secondary | ICD-10-CM | POA: Diagnosis present

## 2020-08-27 DIAGNOSIS — R509 Fever, unspecified: Secondary | ICD-10-CM

## 2020-08-27 DIAGNOSIS — Z6841 Body Mass Index (BMI) 40.0 and over, adult: Secondary | ICD-10-CM

## 2020-08-27 DIAGNOSIS — E1142 Type 2 diabetes mellitus with diabetic polyneuropathy: Secondary | ICD-10-CM | POA: Diagnosis present

## 2020-08-27 DIAGNOSIS — Z888 Allergy status to other drugs, medicaments and biological substances status: Secondary | ICD-10-CM

## 2020-08-27 DIAGNOSIS — F419 Anxiety disorder, unspecified: Secondary | ICD-10-CM | POA: Diagnosis present

## 2020-08-27 DIAGNOSIS — Z20822 Contact with and (suspected) exposure to covid-19: Secondary | ICD-10-CM | POA: Diagnosis present

## 2020-08-27 DIAGNOSIS — J45901 Unspecified asthma with (acute) exacerbation: Secondary | ICD-10-CM | POA: Diagnosis present

## 2020-08-27 DIAGNOSIS — J440 Chronic obstructive pulmonary disease with acute lower respiratory infection: Secondary | ICD-10-CM

## 2020-08-27 DIAGNOSIS — J40 Bronchitis, not specified as acute or chronic: Secondary | ICD-10-CM

## 2020-08-27 DIAGNOSIS — Z7952 Long term (current) use of systemic steroids: Secondary | ICD-10-CM

## 2020-08-27 DIAGNOSIS — Z7984 Long term (current) use of oral hypoglycemic drugs: Secondary | ICD-10-CM

## 2020-08-27 DIAGNOSIS — Z8249 Family history of ischemic heart disease and other diseases of the circulatory system: Secondary | ICD-10-CM

## 2020-08-27 DIAGNOSIS — R0602 Shortness of breath: Secondary | ICD-10-CM

## 2020-08-27 DIAGNOSIS — E1165 Type 2 diabetes mellitus with hyperglycemia: Secondary | ICD-10-CM | POA: Diagnosis present

## 2020-08-27 DIAGNOSIS — Z23 Encounter for immunization: Secondary | ICD-10-CM

## 2020-08-27 DIAGNOSIS — J4541 Moderate persistent asthma with (acute) exacerbation: Principal | ICD-10-CM | POA: Diagnosis present

## 2020-08-27 DIAGNOSIS — R000 Tachycardia, unspecified: Secondary | ICD-10-CM | POA: Diagnosis present

## 2020-08-27 DIAGNOSIS — Z79899 Other long term (current) drug therapy: Secondary | ICD-10-CM

## 2020-08-27 DIAGNOSIS — J209 Acute bronchitis, unspecified: Secondary | ICD-10-CM | POA: Diagnosis present

## 2020-08-27 DIAGNOSIS — Z885 Allergy status to narcotic agent status: Secondary | ICD-10-CM

## 2020-08-27 DIAGNOSIS — J4521 Mild intermittent asthma with (acute) exacerbation: Secondary | ICD-10-CM

## 2020-08-27 DIAGNOSIS — I700 Atherosclerosis of aorta: Secondary | ICD-10-CM | POA: Diagnosis present

## 2020-08-27 DIAGNOSIS — Z801 Family history of malignant neoplasm of trachea, bronchus and lung: Secondary | ICD-10-CM

## 2020-08-27 DIAGNOSIS — R197 Diarrhea, unspecified: Secondary | ICD-10-CM

## 2020-08-27 DIAGNOSIS — E1140 Type 2 diabetes mellitus with diabetic neuropathy, unspecified: Secondary | ICD-10-CM

## 2020-08-27 DIAGNOSIS — K760 Fatty (change of) liver, not elsewhere classified: Secondary | ICD-10-CM | POA: Diagnosis present

## 2020-08-27 DIAGNOSIS — E662 Morbid (severe) obesity with alveolar hypoventilation: Secondary | ICD-10-CM | POA: Diagnosis present

## 2020-08-27 DIAGNOSIS — I10 Essential (primary) hypertension: Secondary | ICD-10-CM | POA: Diagnosis present

## 2020-08-27 DIAGNOSIS — Z833 Family history of diabetes mellitus: Secondary | ICD-10-CM

## 2020-08-27 LAB — CBC
HCT: 41.3 % (ref 39.0–52.0)
Hemoglobin: 14.1 g/dL (ref 13.0–17.0)
MCH: 27.8 pg (ref 26.0–34.0)
MCHC: 34.1 g/dL (ref 30.0–36.0)
MCV: 81.3 fL (ref 80.0–100.0)
Platelets: 319 10*3/uL (ref 150–400)
RBC: 5.08 MIL/uL (ref 4.22–5.81)
RDW: 13.1 % (ref 11.5–15.5)
WBC: 11 10*3/uL — ABNORMAL HIGH (ref 4.0–10.5)
nRBC: 0 % (ref 0.0–0.2)

## 2020-08-27 LAB — BASIC METABOLIC PANEL
Anion gap: 12 (ref 5–15)
BUN: 12 mg/dL (ref 6–20)
CO2: 27 mmol/L (ref 22–32)
Calcium: 9.2 mg/dL (ref 8.9–10.3)
Chloride: 96 mmol/L — ABNORMAL LOW (ref 98–111)
Creatinine, Ser: 0.73 mg/dL (ref 0.61–1.24)
GFR, Estimated: >60 mL/min (ref >60)
Glucose, Bld: 241 mg/dL — ABNORMAL HIGH (ref 70–99)
Potassium: 4.1 mmol/L (ref 3.5–5.1)
Sodium: 135 mmol/L (ref 135–145)

## 2020-08-27 LAB — RESP PANEL BY RT-PCR (FLU A&B, COVID) ARPGX2
Influenza A by PCR: NEGATIVE
Influenza B by PCR: NEGATIVE
SARS Coronavirus 2 by RT PCR: NEGATIVE

## 2020-08-27 LAB — URINALYSIS, COMPLETE (UACMP) WITH MICROSCOPIC
Bacteria, UA: NONE SEEN
Bilirubin Urine: NEGATIVE
Glucose, UA: >=500 mg/dL — AB
Hgb urine dipstick: NEGATIVE
Ketones, ur: NEGATIVE mg/dL
Leukocytes,Ua: NEGATIVE
Nitrite: NEGATIVE
Protein, ur: NEGATIVE mg/dL
Specific Gravity, Urine: 1.018 (ref 1.005–1.030)
pH: 6 (ref 5.0–8.0)

## 2020-08-27 LAB — TROPONIN I (HIGH SENSITIVITY)
Troponin I (High Sensitivity): 5 ng/L (ref <18)
Troponin I (High Sensitivity): 3 ng/L (ref <18)

## 2020-08-27 MED ORDER — MAGNESIUM HYDROXIDE 400 MG/5ML PO SUSP
30.0000 mL | Freq: Every day | ORAL | Status: DC | PRN
  Filled 2020-08-27 (×2): qty 30

## 2020-08-27 MED ORDER — ACETAMINOPHEN 325 MG PO TABS: 650.0000 mg | ORAL_TABLET | Freq: Four times a day (QID) | ORAL | Status: DC | PRN

## 2020-08-27 MED ORDER — GUAIFENESIN ER 600 MG PO TB12
600.0000 mg | ORAL_TABLET | Freq: Two times a day (BID) | ORAL | Status: DC
  Administered 2020-08-28 – 2020-08-29 (×4): 600 mg via ORAL
  Filled 2020-08-27 (×3): qty 1

## 2020-08-27 MED ORDER — GABAPENTIN 100 MG PO CAPS
100.0000 mg | ORAL_CAPSULE | Freq: Two times a day (BID) | ORAL | Status: DC
  Administered 2020-08-28 – 2020-08-29 (×4): 100 mg via ORAL
  Filled 2020-08-27 (×3): qty 1

## 2020-08-27 MED ORDER — METHYLPREDNISOLONE SODIUM SUCC 125 MG IJ SOLR
125.0000 mg | Freq: Once | INTRAMUSCULAR | Status: AC
  Administered 2020-08-27: 125 mg via INTRAVENOUS
  Filled 2020-08-27: qty 2

## 2020-08-27 MED ORDER — IOHEXOL 350 MG/ML SOLN
125.0000 mL | Freq: Once | INTRAVENOUS | Status: AC | PRN
  Administered 2020-08-27: 125 mL via INTRAVENOUS

## 2020-08-27 MED ORDER — IPRATROPIUM BROMIDE 0.02 % IN SOLN
0.5000 mg | Freq: Once | RESPIRATORY_TRACT | Status: AC
  Administered 2020-08-27: 0.5 mg via RESPIRATORY_TRACT
  Filled 2020-08-27: qty 2.5

## 2020-08-27 MED ORDER — FUROSEMIDE 40 MG PO TABS
80.0000 mg | ORAL_TABLET | Freq: Every day | ORAL | Status: DC
  Administered 2020-08-28 – 2020-08-29 (×2): 80 mg via ORAL
  Filled 2020-08-27 (×2): qty 2

## 2020-08-27 MED ORDER — LISINOPRIL 10 MG PO TABS
10.0000 mg | ORAL_TABLET | Freq: Every day | ORAL | Status: DC
Start: 2020-08-28 — End: 2020-08-29
  Administered 2020-08-28 – 2020-08-29 (×2): 10 mg via ORAL
  Filled 2020-08-27 (×3): qty 1

## 2020-08-27 MED ORDER — IPRATROPIUM-ALBUTEROL 0.5-2.5 (3) MG/3ML IN SOLN
3.0000 mL | RESPIRATORY_TRACT | Status: DC | PRN
  Filled 2020-08-27: qty 3

## 2020-08-27 MED ORDER — ONDANSETRON HCL 4 MG/2ML IJ SOLN: 4.0000 mg | Freq: Four times a day (QID) | INTRAMUSCULAR | Status: DC | PRN

## 2020-08-27 MED ORDER — TRAZODONE HCL 50 MG PO TABS
25.0000 mg | ORAL_TABLET | Freq: Every evening | ORAL | Status: DC | PRN
  Administered 2020-08-28: 25 mg via ORAL
  Filled 2020-08-27: qty 1

## 2020-08-27 MED ORDER — NITROGLYCERIN 0.4 MG SL SUBL: 0.4000 mg | SUBLINGUAL_TABLET | SUBLINGUAL | Status: DC | PRN

## 2020-08-27 MED ORDER — ENOXAPARIN SODIUM 80 MG/0.8ML ~~LOC~~ SOLN
0.5000 mg/kg | Freq: Every day | SUBCUTANEOUS | Status: DC
  Filled 2020-08-27: qty 1.6

## 2020-08-27 MED ORDER — IPRATROPIUM-ALBUTEROL 0.5-2.5 (3) MG/3ML IN SOLN
3.0000 mL | Freq: Four times a day (QID) | RESPIRATORY_TRACT | Status: DC
  Administered 2020-08-28 – 2020-08-29 (×6): 3 mL via RESPIRATORY_TRACT
  Filled 2020-08-27 (×5): qty 3

## 2020-08-27 MED ORDER — IPRATROPIUM-ALBUTEROL 0.5-2.5 (3) MG/3ML IN SOLN
3.0000 mL | Freq: Once | RESPIRATORY_TRACT | Status: AC
  Administered 2020-08-27: 3 mL via RESPIRATORY_TRACT
  Filled 2020-08-27: qty 3

## 2020-08-27 MED ORDER — ALBUTEROL SULFATE (2.5 MG/3ML) 0.083% IN NEBU
10.0000 mg | INHALATION_SOLUTION | Freq: Once | RESPIRATORY_TRACT | Status: AC
  Administered 2020-08-27: 10 mg via RESPIRATORY_TRACT
  Filled 2020-08-27: qty 12

## 2020-08-27 MED ORDER — IPRATROPIUM-ALBUTEROL 0.5-2.5 (3) MG/3ML IN SOLN
RESPIRATORY_TRACT | Status: AC
  Filled 2020-08-27: qty 6

## 2020-08-27 MED ORDER — ONDANSETRON HCL 4 MG PO TABS: 4.0000 mg | ORAL_TABLET | Freq: Four times a day (QID) | ORAL | Status: DC | PRN

## 2020-08-27 MED ORDER — PREDNISONE 20 MG PO TABS
40.0000 mg | ORAL_TABLET | Freq: Every day | ORAL | Status: DC
  Administered 2020-08-29: 08:00:00 40 mg via ORAL
  Filled 2020-08-27: qty 2

## 2020-08-27 MED ORDER — LACTATED RINGERS IV BOLUS
1000.0000 mL | Freq: Once | INTRAVENOUS | Status: AC
  Administered 2020-08-27: 1000 mL via INTRAVENOUS

## 2020-08-27 MED ORDER — ENOXAPARIN SODIUM 80 MG/0.8ML ~~LOC~~ SOLN
80.0000 mg | Freq: Every day | SUBCUTANEOUS | Status: DC
  Administered 2020-08-28 – 2020-08-29 (×2): 80 mg via SUBCUTANEOUS
  Filled 2020-08-27 (×2): qty 0.8

## 2020-08-27 MED ORDER — ACETAMINOPHEN 650 MG RE SUPP: 650.0000 mg | Freq: Four times a day (QID) | RECTAL | Status: DC | PRN

## 2020-08-27 MED ORDER — METHYLPREDNISOLONE SODIUM SUCC 40 MG IJ SOLR
40.0000 mg | Freq: Four times a day (QID) | INTRAMUSCULAR | Status: AC
Start: 2020-08-28 — End: 2020-08-28
  Administered 2020-08-28 (×4): 40 mg via INTRAVENOUS
  Filled 2020-08-27 (×4): qty 1

## 2020-08-27 MED ORDER — SODIUM CHLORIDE 0.9 % IV SOLN: INTRAVENOUS | Status: DC

## 2020-08-27 MED ORDER — DILTIAZEM HCL ER COATED BEADS 240 MG PO CP24
240.0000 mg | ORAL_CAPSULE | Freq: Every day | ORAL | Status: DC
  Administered 2020-08-28 – 2020-08-29 (×2): 240 mg via ORAL
  Filled 2020-08-27 (×2): qty 1
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
## 2020-08-27 NOTE — ED Triage Notes (Signed)
Pt present to the ED for SOB for the past 2-3 weeks. Pt has hx of asthma, pt also had an exposure to COVID but got tested on 12/5 and was negative. Pt also c/o upper abdominal cramping that radiates to his back. Pt is A&Ox4 but increase WOB on arrival. Pt has tried his prescribed nebulizer treatment with no relief.

## 2020-08-27 NOTE — H&P (Addendum)
[place]   PATIENT NAME: Christian Barrett    MR#:  [id number]  DATE OF BIRTH:  Nov 28, 1985  DATE OF ADMISSION:  08/27/2020  PRIMARY CARE PHYSICIAN: Patient, No Pcp Per   REQUESTING/REFERRING PHYSICIAN: Delton Prairie, MD  CHIEF COMPLAINT:   Chief Complaint  Patient presents with  . Shortness of Breath    HISTORY OF PRESENT ILLNESS:  Christian Barrett  is a 34 y.o. morbidly obese Caucasian male with a known history of asthma, hypertension, morbid obesity and bipolar disorder, who presented to the emergency with acute onset of worsening dyspnea with associated cough and wheezing over the last couple weeks that significantly worsened today.  He denied any fever or chills or chest pain.  He has been having upper and lower abdominal cramps that extend to his back.  No nausea or vomiting, dysuria, oliguria or hematuria or flank pain.  He was seen in urgent care as he was exposed to the case of COVID-19 but tested negative.  He was prescribed amoxicillin and Zithromax in addition to [employer].  He was initially in significant respiratory distress he was placed on BiPAP and later on nasal cannula.  Upon presentation to the emergency room, blood pressure was 148/90 with a heart rate of 120 and respiratory rate of 28.  Labs revealed a glucose of 241 and WBC of 11 with otherwise unremarkable CBC and BMP.  Two-view chest x-ray showed no acute cardiopulmonary disease and chest CTA revealed no large central or proximal lobar filling defects to suggest PE.  It showed diffuse mild airway thickening that reflect this reactive airway disease or bronchitis.  It showed aortic atherosclerosis and hepatic steatosis with no acute intrathoracic process.  The patient was given continuous nebulized albuterol 10 mg as well as duo nebs and nebulized Atrovent, 1 L bolus of IV lactated Ringer 125 mg of IV Solu-Medrol.  He will be admitted to a medical monitored bed for further evaluation and management.  PAST  MEDICAL HISTORY:   Past Medical History:  Diagnosis Date  . Asthma   . Bipolar disorder (HCC)   . Chest pain    a. 09/2019 Cath: LM nl, LAD nl w/ ? distal myocardial bridge, LCX nl, RCA nl. EF 65%. LVEDP 20, RA 16, PA 37/20(26), PCWP 20. CO/CI 8.0/3.2.  . DOE (dyspnea on exertion)    a. 10/2019 Echo: EF 60-65%, borderline LVH. NO rwma. Nl RV fxn. Triv TR.  Marland Kitchen Hepatic steatosis   . Hypertension   . Morbid obesity (HCC)   . Renal disorder     PAST SURGICAL HISTORY:   Past Surgical History:  Procedure Laterality Date  . BACK SURGERY    . RIGHT/LEFT HEART CATH AND CORONARY ANGIOGRAPHY N/A 10/04/2019   Procedure: RIGHT/LEFT HEART CATH AND CORONARY ANGIOGRAPHY;  Surgeon: Yvonne Kendall, MD;  Location: [facility] INVASIVE CV LAB;  Service: Cardiovascular;  Laterality: N/A;  . SHOULDER ARTHROSCOPY  2018   HAD BONE SPURS REMOVED    SOCIAL HISTORY:   Social History   Tobacco Use  . Smoking status: Never Smoker  . Smokeless tobacco: Never Used  Substance Use Topics  . Alcohol use: Never    FAMILY HISTORY:   Family History  Problem Relation Age of Onset  . Coronary artery disease Mother 55       CABG  . Lung cancer Mother   . Diabetes Father   . Heart attack Paternal Grandmother     DRUG ALLERGIES:   Allergies  Allergen  Reactions  . Lamotrigine Rash    And welts And welts  And welts  . Tramadol Nausea And Vomiting and Other (See Comments)      Other reaction(s): Unknown    REVIEW OF SYSTEMS:   As per history of present illness. All pertinent systems were reviewed above. Constitutional, HEENT, cardiovascular, respiratory, GI, GU, musculoskeletal, neuro, psychiatric, endocrine, integumentary and hematologic systems were reviewed and are otherwise negative/unremarkable except for positive findings mentioned above in the HPI.   MEDICATIONS AT HOME:   Prior to Admission medications   Medication Sig Start Date End Date Taking? Authorizing Provider  amoxicillin (AMOXIL)  500 MG capsule Take 500 mg by mouth 3 (three) times daily.   Yes [provider]  etodolac (LODINE) 500 MG tablet Take 500 mg by mouth 2 (two) times daily.   Yes [provider]  gabapentin (NEURONTIN) 100 MG capsule Take 100 mg twice a day for one week, then increase to 200 mg(2 tablets) twice a day and continue 03/02/20  Yes [provider]  lisinopril (ZESTRIL) 10 MG tablet Take 1 tablet (10 mg total) by mouth daily. 04/06/20 07/05/20 Yes Danelle Berry, PA-C  metFORMIN (GLUCOPHAGE-XR) 500 MG 24 hr tablet Take 1 tablet (500 mg total) by mouth in the morning and at bedtime. 03/20/20  Yes Danelle Berry, PA-C  predniSONE (DELTASONE) 10 MG tablet Take 10 mg by mouth daily with breakfast.   Yes [provider]  albuterol (PROVENTIL) (2.5 MG/3ML) 0.083% nebulizer solution Take 3 mLs (2.5 mg total) by nebulization every 6 (six) hours as needed for wheezing or shortness of breath. Patient not taking: Reported on 08/27/2020 07/23/20   Junie Spencer, FNP  albuterol (VENTOLIN HFA) 108 (90 Base) MCG/ACT inhaler Inhale 2 puffs into the lungs every 4 (four) hours as needed for wheezing or shortness of breath. Patient not taking: Reported on 08/27/2020 06/25/20   Roxy Horseman, PA-C  azithromycin [facility]) 250 MG tablet Take 1 a day for 4 days Patient not taking: Reported on 08/27/2020 04/24/20   Gilles Chiquito, MD  benzonatate (TESSALON PERLES) 100 MG capsule Take 1 capsule (100 mg total) by mouth 3 (three) times daily as needed. Patient not taking: Reported on 08/27/2020 08/02/20   Jannifer Rodney A, FNP  Budesonide 90 MCG/ACT inhaler Inhale 1 puff into the lungs 2 (two) times daily. Patient not taking: Reported on 08/27/2020 04/24/20   Nita Sickle, MD  diltiazem (CARDIZEM CD) 240 MG 24 hr capsule Take 1 capsule (240 mg total) by mouth daily. Patient not taking: Reported on 08/27/2020 10/13/19 10/12/20  Creig Hines, NP  escitalopram (LEXAPRO) 10 MG tablet  Take by mouth. Patient not taking: Reported on 08/27/2020 11/15/19   [provider]  fluticasone (FLONASE) 50 MCG/ACT nasal spray Place 2 sprays into both nostrils daily. Patient not taking: Reported on 08/27/2020 06/25/20   Roxy Horseman, PA-C  Fluticasone-Salmeterol (ADVAIR DISKUS) 250-50 MCG/DOSE AEPB Inhale 1 puff into the lungs in the morning and at bedtime. Patient not taking: Reported on 08/27/2020 04/06/20   Danelle Berry, PA-C  furosemide (LASIX) 40 MG tablet Take 1 tablet (40 mg total) by mouth daily. Patient taking differently: Take 80 mg by mouth daily.  10/14/19 01/12/20  Creig Hines, NP  furosemide (LASIX) 40 MG tablet Take by mouth. Patient not taking: Reported on 08/27/2020    [provider]  ipratropium-albuterol (DUONEB) 0.5-2.5 (3) MG/3ML SOLN Take 3 mLs by nebulization 3 (three) times daily as needed. Patient not taking: Reported  on 08/27/2020 03/05/20   Danelle Berryapia, Leisa, PA-C  levocetirizine (XYZAL) 5 MG tablet Take 1 tablet (5 mg total) by mouth every evening. Patient not taking: Reported on 08/27/2020 06/25/20   Roxy HorsemanBrowning, Robert, PA-C  montelukast (SINGULAIR) 10 MG tablet Take 1 tablet (10 mg total) by mouth at bedtime. For asthma and allergies Patient not taking: Reported on 08/27/2020 03/05/20   Danelle Berryapia, Leisa, PA-C  nitroGLYCERIN (NITROSTAT) 0.4 MG SL tablet Place 1 tablet (0.4 mg total) under the tongue every 5 (five) minutes as needed for chest pain (maximum of 3 doses.). Patient not taking: Reported on 08/27/2020 09/23/19   End, Cristal Deerhristopher, MD  ondansetron (ZOFRAN) 4 MG tablet Take 1 tablet (4 mg total) by mouth every 8 (eight) hours as needed for nausea or vomiting. Patient not taking: Reported on 08/27/2020 08/02/20   Jannifer RodneyHawks, Christy A, FNP  pantoprazole (PROTONIX) 20 MG tablet Take 1 tablet (20 mg total) by mouth daily. Patient not taking: Reported on 08/27/2020 03/08/20   Danelle Berryapia, Leisa, PA-C      VITAL SIGNS:  Blood pressure 140/78, pulse  (!) 105, temperature 98.6 F (37 C), temperature source Oral, resp. rate 19, height 5\' 5"  (1.651 m), weight (!) 163.3 kg, SpO2 98 %.  PHYSICAL EXAMINATION:   GENERAL:  34 y.o.-year-old morbidly obese male patient lying in the bed with mild to moderate respiratory distress with conversational dyspnea. EYES: Pupils equal, round, reactive to light and accommodation. No scleral icterus. Extraocular muscles intact.  HEENT: Head atraumatic, normocephalic. Oropharynx and nasopharynx clear.  NECK:  Supple, no jugular venous distention. No thyroid enlargement, no tenderness.  LUNGS: Diffuse expiratory wheezes with tight expiratory airflow and harsh vesicular breathing. CARDIOVASCULAR: Regular rate and rhythm, S1, S2 normal. No murmurs, rubs, or gallops.  ABDOMEN: Soft, nondistended, nontender. Bowel sounds present. No organomegaly or mass.  EXTREMITIES: No pedal edema, cyanosis, or clubbing.  NEUROLOGIC: Cranial nerves II through XII are intact. Muscle strength 5/5 in all extremities. Sensation intact. Gait not checked.  PSYCHIATRIC: The patient is alert and oriented x 3.  Normal affect and good eye contact. SKIN: No obvious rash, lesion, or ulcer.   LABORATORY PANEL:   CBC Recent Labs  Lab 08/27/20 1822  WBC 11.0*  HGB 14.1  HCT 41.3  PLT 319   ------------------------------------------------------------------------------------------------------------------  Chemistries  Recent Labs  Lab 08/27/20 1822  NA 135  K 4.1  CL 96*  CO2 27  GLUCOSE 241*  BUN 12  CREATININE 0.73  CALCIUM 9.2   ------------------------------------------------------------------------------------------------------------------  Cardiac Enzymes No results for input(s): TROPONINI in the last 168 hours. ------------------------------------------------------------------------------------------------------------------  RADIOLOGY:  DG Chest 2 View  Result Date: 08/27/2020 CLINICAL DATA:  Shortness of breath  for 2 weeks. EXAM: CHEST - 2 VIEW COMPARISON:  April 23, 2020 FINDINGS: The heart size and mediastinal contours are within normal limits. Both lungs are clear. The visualized skeletal structures are unremarkable. IMPRESSION: No active cardiopulmonary disease. Electronically Signed   By: Aram Candelahaddeus  Houston M.D.   On: 08/27/2020 19:22   CT Angio Chest PE W and/or Wo Contrast  Result Date: 08/27/2020 CLINICAL DATA:  PE suspected, history of asthma, tachycardic and shortness of breath EXAM: CT ANGIOGRAPHY CHEST WITH CONTRAST TECHNIQUE: Multidetector CT imaging of the chest was performed using the standard protocol during bolus administration of intravenous contrast. Multiplanar CT image reconstructions and MIPs were obtained to evaluate the vascular anatomy. CONTRAST:  125mL OMNIPAQUE IOHEXOL 350 MG/ML SOLN COMPARISON:  Radiograph 08/27/2020 FINDINGS: Cardiovascular: Suboptimal opacification of the pulmonary arteries with the  central pulmonary arterial contrast bolus measuring only 175 Hounsfield units. No large central or proximal lobar filling defects. More distal assessment is limited by the poor opacification of the pulmonary arteries and extensive respiratory motion artifact. Central pulmonary arteries are normal caliber. Normal heart size. No pericardial effusion. The aortic root is suboptimally assessed given cardiac pulsation artifact. Atherosclerotic plaque within the normal caliber aorta. No acute luminal abnormality of the imaged aorta. No periaortic stranding or hemorrhage. Mediastinum/Nodes: No mediastinal fluid or gas. Normal thyroid gland and thoracic inlet. No acute abnormality of the trachea or esophagus. No worrisome mediastinal, hilar or axillary adenopathy. Lungs/Pleura: Diffuse mild airways thickening. No consolidation, features of edema, pneumothorax, or effusion. No suspicious pulmonary nodules or masses. Upper Abdomen: No acute abnormalities present in the visualized portions of the upper  abdomen. Diffuse hepatic hypoattenuation compatible with hepatic steatosis. Sparing along the gallbladder fossa. Small accessory splenule seen anterior to the spleen. Musculoskeletal: No chest wall abnormality. No acute or significant osseous findings. Early discogenic changes in the spine. Review of the MIP images confirms the above findings. IMPRESSION: 1. Suboptimal opacification of the pulmonary arteries with the central pulmonary arterial contrast bolus measuring only 175 Hounsfield units. No large central or proximal lobar filling defects. More distal assessment is limited by the poor opacification and extensive respiratory motion artifact. 2. Diffuse mild airways thickening, could reflect reactive airways disease or bronchitis. 3. No other acute intrathoracic process. 4. Hepatic steatosis. 5. Aortic Atherosclerosis (ICD10-I70.0). Electronically Signed   By: Kreg Shropshire M.D.   On: 08/27/2020 23:05      IMPRESSION AND PLAN:   1.  Acute asthma exacerbation like secondary to acute bronchitis. -The patient will be admitted to a medical monitored bed. -He will be placed on scheduled and as needed duo nebs. -IV steroid therapy will be continued with Solu-Medrol. -Mucolytic therapy will be provided. -We will add IV Rocephin and Zithromax for suspected acute bronchitis with severe asthma exacerbation. -We will continue his budesonide inhaler and Singulair.  2.  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 -He will be placed on supplement coverage with NovoLog and will continue his Neurontin.  3.  Essential hypertension. -We will continue his Cardizem CD.  4.  Bipolar disorder and anxiety. -We will continue his Lexapro and Pamelor as well as Seroquel.  5.  Abdominal cramps. -This could be secondary to his cough. -We will place on as needed Flexeril and IV morphine sulfate.  6.  DVT prophylaxis. -Subcutaneous Lovenox.   All the records are reviewed and case  discussed with ED provider. The plan of care was discussed in details with the patient (and family). I answered all questions. The patient agreed to proceed with the above mentioned plan. Further management will depend upon hospital course.   CODE STATUS: Full code  Status is: Observation  The patient remains OBS appropriate and will d/c before 2 midnights.  Dispo: The patient is from: Home              Anticipated d/c is to: Home              Anticipated d/c date is: 1 day              Patient currently is not medically stable to d/c.   TOTAL TIME TAKING CARE OF THIS PATIENT: 55 minutes.    Hannah Beat M.D on 08/27/2020 at 11:24 PM  [facility]   From 7 PM-7 AM, contact night-coverage [email or web address]  CC: Primary  care physician; Patient, No Pcp Per

## 2020-08-27 NOTE — Progress Notes (Signed)
PHARMACIST - PHYSICIAN COMMUNICATION  CONCERNING:  Enoxaparin (Lovenox) for DVT Prophylaxis    RECOMMENDATION: Patient was prescribed enoxaparin 40mg  q24 hours for VTE prophylaxis.   Filed Weights   08/27/20 1820  Weight: (!) 163.3 kg (360 lb)    Body mass index is 59.91 kg/m.  Estimated Creatinine Clearance: 184.5 mL/min (by C-G formula based on SCr of 0.73 mg/dL).   Based on [facility] policy patient is candidate for enoxaparin 0.5mg /kg TBW SQ every 24 hours based on BMI being >30.  DESCRIPTION: Pharmacy has adjusted enoxaparin dose per [facility] policy.  Patient is now receiving enoxaparin 82.5 mg every 24 hours   [facility] 08/27/2020 11:28 PM

## 2020-08-27 NOTE — Progress Notes (Signed)
Based on what you shared with me, I feel your condition warrants further evaluation and I recommend that you be seen for a face to face office visit.    Given your current symptoms, you need to be seen face to face given you have already been prescribed antibiotics and steroids and no improvements.    NOTE: If you entered your credit card information for this eVisit, you will not be charged. You may see a "hold" on your card for the $35 but that hold will drop off and you will not have a charge processed.   If you are having a true medical emergency please call 911.      For an urgent face to face visit, [place] has five urgent care centers for your convenience:     [facility] at [facility] Directions [phone] [address] [place], [place] [address] . 10 am - 6pm Monday - Friday    [facility]) Get Driving Directions [phone] [address] [place], [place] [address] . 10 am to 8 pm Monday-Friday . 12 pm to 8 pm [facility] Get Driving Directions [phone] [address], [address] [place], [place] [address] . 8 am to 8 pm Monday-Friday . 9 am to 6 pm Saturday . 11 am to 6 pm Sunday     [facility] at [facility] Get Driving Directions  [phone] [address] [place], [place] [address] . 8 am to 8 pm Monday-Friday . 8 am to 4 pm [facility] Urgent Care at [facility] Directions [phone] [address] [place], [place] [address] . 12 pm to 6 pm Monday-Friday      Your e-visit answers were reviewed by a board certified advanced clinical practitioner to complete your personal care plan.  Thank you for using e-Visits.

## 2020-08-28 ENCOUNTER — Other Ambulatory Visit: Payer: Self-pay | Admitting: Specialist

## 2020-08-28 DIAGNOSIS — R000 Tachycardia, unspecified: Secondary | ICD-10-CM

## 2020-08-28 DIAGNOSIS — E1140 Type 2 diabetes mellitus with diabetic neuropathy, unspecified: Secondary | ICD-10-CM

## 2020-08-28 DIAGNOSIS — J40 Bronchitis, not specified as acute or chronic: Secondary | ICD-10-CM

## 2020-08-28 DIAGNOSIS — E662 Morbid (severe) obesity with alveolar hypoventilation: Secondary | ICD-10-CM

## 2020-08-28 LAB — CBC
HCT: 43 % (ref 39.0–52.0)
Hemoglobin: 14.4 g/dL (ref 13.0–17.0)
MCH: 27.4 pg (ref 26.0–34.0)
MCHC: 33.5 g/dL (ref 30.0–36.0)
MCV: 81.9 fL (ref 80.0–100.0)
Platelets: 332 10*3/uL (ref 150–400)
RBC: 5.25 MIL/uL (ref 4.22–5.81)
RDW: 13.2 % (ref 11.5–15.5)
WBC: 12.6 10*3/uL — ABNORMAL HIGH (ref 4.0–10.5)
nRBC: 0 % (ref 0.0–0.2)

## 2020-08-28 LAB — BASIC METABOLIC PANEL
Anion gap: 14 (ref 5–15)
BUN: 12 mg/dL (ref 6–20)
CO2: 21 mmol/L — ABNORMAL LOW (ref 22–32)
Calcium: 9.3 mg/dL (ref 8.9–10.3)
Chloride: 101 mmol/L (ref 98–111)
Creatinine, Ser: 0.69 mg/dL (ref 0.61–1.24)
GFR, Estimated: >60 mL/min (ref >60)
Glucose, Bld: 264 mg/dL — ABNORMAL HIGH (ref 70–99)
Potassium: 4.7 mmol/L (ref 3.5–5.1)
Sodium: 136 mmol/L (ref 135–145)

## 2020-08-28 LAB — GLUCOSE, CAPILLARY: Glucose-Capillary: 391 mg/dL — ABNORMAL HIGH (ref 70–99)

## 2020-08-28 LAB — AMYLASE: Amylase: 45 U/L (ref 28–100)

## 2020-08-28 MED ORDER — METFORMIN HCL 500 MG PO TABS
500.0000 mg | ORAL_TABLET | Freq: Two times a day (BID) | ORAL | Status: DC
  Administered 2020-08-29: 08:00:00 500 mg via ORAL
  Filled 2020-08-28 (×4): qty 1

## 2020-08-28 MED ORDER — AZITHROMYCIN 500 MG PO TABS: 250.0000 mg | ORAL_TABLET | Freq: Every day | ORAL | Status: DC

## 2020-08-28 MED ORDER — MORPHINE SULFATE (PF) 2 MG/ML IV SOLN: 2.0000 mg | INTRAVENOUS | Status: DC | PRN

## 2020-08-28 MED ORDER — CYCLOBENZAPRINE HCL 10 MG PO TABS: 10.0000 mg | ORAL_TABLET | Freq: Three times a day (TID) | ORAL | Status: DC | PRN

## 2020-08-28 MED ORDER — INSULIN ASPART 100 UNIT/ML ~~LOC~~ SOLN
0.0000 [IU] | Freq: Three times a day (TID) | SUBCUTANEOUS | Status: DC
  Administered 2020-08-29: 11 [IU] via SUBCUTANEOUS
  Administered 2020-08-29: 15 [IU] via SUBCUTANEOUS
  Filled 2020-08-28 (×2): qty 1

## 2020-08-28 MED ORDER — PNEUMOCOCCAL VAC POLYVALENT 25 MCG/0.5ML IJ INJ: 0.5000 mL | INJECTION | INTRAMUSCULAR | Status: DC

## 2020-08-28 MED ORDER — INSULIN ASPART 100 UNIT/ML ~~LOC~~ SOLN
0.0000 [IU] | Freq: Every day | SUBCUTANEOUS | Status: DC
  Administered 2020-08-28: 21:00:00 5 [IU] via SUBCUTANEOUS
  Filled 2020-08-28: qty 1

## 2020-08-28 MED ORDER — SODIUM CHLORIDE 0.9 % IV SOLN
500.0000 mg | INTRAVENOUS | Status: DC
  Administered 2020-08-28: 500 mg via INTRAVENOUS
  Filled 2020-08-28 (×2): qty 500

## 2020-08-28 MED ORDER — LEVOFLOXACIN 500 MG PO TABS
500.0000 mg | ORAL_TABLET | Freq: Every day | ORAL | Status: DC
  Administered 2020-08-29: 10:00:00 500 mg via ORAL
  Filled 2020-08-28: qty 1

## 2020-08-28 MED ORDER — SODIUM CHLORIDE 0.9 % IV SOLN
2.0000 g | INTRAVENOUS | Status: DC
  Administered 2020-08-28: 2 g via INTRAVENOUS
  Filled 2020-08-28 (×2): qty 20

## 2020-08-28 MED ORDER — INFLUENZA VAC SPLIT QUAD 0.5 ML IM SUSY
0.5000 mL | PREFILLED_SYRINGE | INTRAMUSCULAR | Status: AC
  Administered 2020-08-29: 0.5 mL via INTRAMUSCULAR
  Filled 2020-08-28: qty 0.5

## 2020-08-28 NOTE — Consult Note (Signed)
Pulmonary Medicine          Date: 08/28/2020,   MRN# [id number] Christian Barrett 1985/12/13     AdmissionWeight: (!) 163.3 kg                 CurrentWeight: (!) 163.3 kg      CHIEF COMPLAINT:   Asthma ttack   HISTORY OF PRESENT ILLNESS   This is a pleasant, over weight male, hx of bipolar, ht, on cpap, comes in with sob, wheezing, known hx of asthma and abdominal cramping, loose stools x 3 weeks. n blood. Less frequent but still lose.  He said for the wheezing several weeks ago was treated with antibiotics and prednisone. covid negative when last tested. No fever, headaches or myalgias.   At present no pleurisy, trace edema, no calf pain. No gerd, choking spells. No recent travel.    PAST MEDICAL HISTORY   Past Medical History:  Diagnosis Date  . Asthma   . Bipolar disorder (HCC)   . Chest pain    a. 09/2019 Cath: LM nl, LAD nl w/ ? distal myocardial bridge, LCX nl, RCA nl. EF 65%. LVEDP 20, RA 16, PA 37/20(26), PCWP 20. CO/CI 8.0/3.2.  . DOE (dyspnea on exertion)    a. 10/2019 Echo: EF 60-65%, borderline LVH. NO rwma. Nl RV fxn. Triv TR.  Marland Kitchen Hepatic steatosis   . Hypertension   . Morbid obesity (HCC)   . Renal disorder      SURGICAL HISTORY   Past Surgical History:  Procedure Laterality Date  . BACK SURGERY    . RIGHT/LEFT HEART CATH AND CORONARY ANGIOGRAPHY N/A 10/04/2019   Procedure: RIGHT/LEFT HEART CATH AND CORONARY ANGIOGRAPHY;  Surgeon: Yvonne Kendall, MD;  Location: [facility] INVASIVE CV LAB;  Service: Cardiovascular;  Laterality: N/A;  . SHOULDER ARTHROSCOPY  2018   HAD BONE SPURS REMOVED     FAMILY HISTORY   Family History  Problem Relation Age of Onset  . Coronary artery disease Mother 110       CABG  . Lung cancer Mother   . Diabetes Father   . Heart attack Paternal Grandmother      SOCIAL HISTORY   Social History   Tobacco Use  . Smoking status: Never Smoker  . Smokeless tobacco: Never Used  Vaping Use  . Vaping Use: Never  used  Substance Use Topics  . Alcohol use: Never  . Drug use: Never     MEDICATIONS    Home Medication:    Current Medication:  Current Facility-Administered Medications:  .  acetaminophen (TYLENOL) tablet 650 mg, 650 mg, Oral, Q6H PRN **OR** acetaminophen (TYLENOL) suppository 650 mg, 650 mg, Rectal, Q6H PRN, Mansy, Jan A, MD .  cyclobenzaprine (FLEXERIL) tablet 10 mg, 10 mg, Oral, TID PRN, Mansy, Jan A, MD .  diltiazem (CARDIZEM CD) 24 hr capsule 240 mg, 240 mg, Oral, Daily, Mansy, Jan A, MD, 240 mg at 08/28/20 1002 .  enoxaparin (LOVENOX) injection 80 mg, 80 mg, Subcutaneous, Daily, Mansy, Jan A, MD, 80 mg at 08/28/20 1002 .  furosemide (LASIX) tablet 80 mg, 80 mg, Oral, Daily, Mansy, Jan A, MD, 80 mg at 08/28/20 1001 .  gabapentin (NEURONTIN) capsule 100 mg, 100 mg, Oral, BID, Mansy, Jan A, MD, 100 mg at 08/28/20 1001 .  guaiFENesin (MUCINEX) 12 hr tablet 600 mg, 600 mg, Oral, BID, Mansy, Jan A, MD, 600 mg at 08/28/20 1001 .  [START ON 08/29/2020] influenza vac split quadrivalent PF (FLUARIX) injection  0.5 mL, 0.5 mL, Intramuscular, Tomorrow-1000, Bryant, Paris L, RN .  [START ON 08/29/2020] insulin aspart (novoLOG) injection 0-15 Units, 0-15 Units, Subcutaneous, TID WC, Enedina Finner, MD .  insulin aspart (novoLOG) injection 0-5 Units, 0-5 Units, Subcutaneous, QHS, Enedina Finner, MD .  ipratropium-albuterol (DUONEB) 0.5-2.5 (3) MG/3ML nebulizer solution 3 mL, 3 mL, Nebulization, QID, Mansy, Jan A, MD, 3 mL at 08/28/20 1311 .  ipratropium-albuterol (DUONEB) 0.5-2.5 (3) MG/3ML nebulizer solution 3 mL, 3 mL, Nebulization, Q4H PRN, Tressie Ellis, RPH .  [START ON 08/29/2020] levofloxacin (LEVAQUIN) tablet 500 mg, 500 mg, Oral, Daily, Enedina Finner, MD .  lisinopril (ZESTRIL) tablet 10 mg, 10 mg, Oral, Daily, Mansy, Jan A, MD, 10 mg at 08/28/20 1001 .  magnesium hydroxide (MILK OF MAGNESIA) suspension 30 mL, 30 mL, Oral, Daily PRN, Mansy, Jan A, MD .  metFORMIN (GLUCOPHAGE) tablet 500 mg,  500 mg, Oral, BID WC, Enedina Finner, MD .  methylPREDNISolone sodium succinate (SOLU-MEDROL) 40 mg/mL injection 40 mg, 40 mg, Intravenous, Q6H, 40 mg at 08/28/20 1713 **FOLLOWED BY** [START ON 08/29/2020] predniSONE (DELTASONE) tablet 40 mg, 40 mg, Oral, Q breakfast, Mansy, Jan A, MD .  nitroGLYCERIN (NITROSTAT) SL tablet 0.4 mg, 0.4 mg, Sublingual, Q5 min PRN, Mansy, Jan A, MD .  ondansetron (ZOFRAN) tablet 4 mg, 4 mg, Oral, Q6H PRN **OR** ondansetron (ZOFRAN) injection 4 mg, 4 mg, Intravenous, Q6H PRN, Mansy, Jan A, MD .  Immaculata.Grant ON 08/29/2020] pneumococcal 23 valent vaccine (PNEUMOVAX-23) injection 0.5 mL, 0.5 mL, Intramuscular, Tomorrow-1000, Bryant, Paris L, RN .  traZODone (DESYREL) tablet 25 mg, 25 mg, Oral, QHS PRN, Mansy, Jan A, MD    ALLERGIES   Lamotrigine and Tramadol     REVIEW OF SYSTEMS    Review of Systems:  Gen:  Denies  fever, sweats, chills weigh loss less sob HEENT: Denies blurred vision, double vision, ear pain, eye pain, hearing loss, nose bleeds, sore throat Cardiac:  No dizziness, chest pain or heaviness, chest tightness,edema Resp:   Denies cough or sputum porduction, less shortness of breath ,wheezing, hemoptysis,  Gi: Denies swallowing difficulty, stomach pain, nausea or vomiting, diarrhea, constipation, bowel incontinence Gu:  Denies bladder incontinence, burning urine Ext:   Denies Joint pain, stiffness or swelling Skin: Denies  skin rash, easy bruising or bleeding or hives Endoc:  Denies polyuria, polydipsia , polyphagia or weight change Psych:   Denies depression, insomnia or hallucinations   Other:  All other systems negative   VS: BP 137/73 (BP Location: Right Arm)   Pulse (!) 108   Temp 98.1 F (36.7 C) (Oral)   Resp 16   Ht 5\' 5"  (1.651 m)   Wt (!) 163.3 kg   SpO2 97%   BMI 59.91 kg/m      PHYSICAL EXAM    GENERAL:NAD, no fevers, chills, no weakness no fatigue, large habitus, sitting in bed side chair HEAD: Normocephalic, atraumatic.  glasses on EYES: Pupils equal, round, reactive to light. Extraocular muscles intact. No scleral icterus.  MOUTH: Moist mucosal membrane. Dentition intact. No abscess noted. ceowded EAR, NOSE, THROAT: Clear without exudates. No external lesions.  NECK: Supple. Thick, short neck, No thyromegaly. No nodules. No JVD.  PULMONARY: Diffuse coarse rhonchi right sided +wheezes CARDIOVASCULAR: S1 and S2. Regular rate and rhythm. No murmurs, rubs, or gallops. No edema. Pedal pulses 2+ bilaterally.  GASTROINTESTINAL: Soft, nontender, nondistended. No masses. Positive bowel sounds. No hepatosplenomegaly.  MUSCULOSKELETAL: No swelling, clubbing, or edema. Range of motion full in all extremities.  NEUROLOGIC: Cranial nerves II through XII are intact. No gross focal neurological deficits. Sensation intact. Reflexes intact.  SKIN: No ulceration, lesions, rashes, or cyanosis. Skin warm and dry. Turgor intact.  PSYCHIATRIC: Mood, affect within normal limits. The patient is awake, alert and oriented x 3. Insight, judgment intact.       IMAGING    DG Chest 2 View  Result Date: 08/27/2020 CLINICAL DATA:  Shortness of breath for 2 weeks. EXAM: CHEST - 2 VIEW COMPARISON:  April 23, 2020 FINDINGS: The heart size and mediastinal contours are within normal limits. Both lungs are clear. The visualized skeletal structures are unremarkable. IMPRESSION: No active cardiopulmonary disease. Electronically Signed   By: Aram Candela M.D.   On: 08/27/2020 19:22   CT Angio Chest PE W and/or Wo Contrast  Result Date: 08/27/2020 CLINICAL DATA:  PE suspected, history of asthma, tachycardic and shortness of breath EXAM: CT ANGIOGRAPHY CHEST WITH CONTRAST TECHNIQUE: Multidetector CT imaging of the chest was performed using the standard protocol during bolus administration of intravenous contrast. Multiplanar CT image reconstructions and MIPs were obtained to evaluate the vascular anatomy. CONTRAST:  OMNIPAQUE IOHEXOL  350 MG/ML SOLN COMPARISON:  Radiograph 08/27/2020 FINDINGS: Cardiovascular: Suboptimal opacification of the pulmonary arteries with the central pulmonary arterial contrast bolus measuring only 175 Hounsfield units. No large central or proximal lobar filling defects. More distal assessment is limited by the poor opacification of the pulmonary arteries and extensive respiratory motion artifact. Central pulmonary arteries are normal caliber. Normal heart size. No pericardial effusion. The aortic root is suboptimally assessed given cardiac pulsation artifact. Atherosclerotic plaque within the normal caliber aorta. No acute luminal abnormality of the imaged aorta. No periaortic stranding or hemorrhage. Mediastinum/Nodes: No mediastinal fluid or gas. Normal thyroid gland and thoracic inlet. No acute abnormality of the trachea or esophagus. No worrisome mediastinal, hilar or axillary adenopathy. Lungs/Pleura: Diffuse mild airways thickening. No consolidation, features of edema, pneumothorax, or effusion. No suspicious pulmonary nodules or masses. Upper Abdomen: No acute abnormalities present in the visualized portions of the upper abdomen. Diffuse hepatic hypoattenuation compatible with hepatic steatosis. Sparing along the gallbladder fossa. Small accessory splenule seen anterior to the spleen. Musculoskeletal: No chest wall abnormality. No acute or significant osseous findings. Early discogenic changes in the spine. Review of the MIP images confirms the above findings. IMPRESSION: 1. Suboptimal opacification of the pulmonary arteries with the central pulmonary arterial contrast bolus measuring only 175 Hounsfield units. No large central or proximal lobar filling defects. More distal assessment is limited by the poor opacification and extensive respiratory motion artifact. 2. Diffuse mild airways thickening, could reflect reactive airways disease or bronchitis. 3. No other acute intrathoracic process. 4. Hepatic steatosis.  5. Aortic Atherosclerosis (ICD10-I70.0). Electronically Signed   By: Kreg Shropshire M.D.   On: 08/27/2020 23:05      ASSESSMENT/PLAN   This is a 34 year male came in with brobchospasm, known hx of mild persistent asthma. Responding to present regimen -will continue as you are doing  Severe osa, cpap will her -per respiratory ok to use his cpap -other wise resume bipap on last pm settings  Obesity, major contributor to his chronic dyspnea -out weight management  Abdominal pain and loose stools -ck c.diff -monitor pain and exam -amylase -following with you  Thanks for the consult      Thank you for allowing me to participate in the care of this patient.   Patient/Family are satisfied with care plan and all questions have been answered.  This document was prepared using Dragon voice recognition software and may include unintentional dictation errors.     Wallene Huh, M.D.  Division of [facility]

## 2020-08-28 NOTE — ED Notes (Addendum)
Pt sat up in bed to eat breakfast at this time. Meal tray given. Pt tolerating well at this time

## 2020-08-28 NOTE — ED Notes (Signed)
Admitting MD Vernon Prey regarding pt level of care and bed status at this time.

## 2020-08-28 NOTE — Progress Notes (Signed)
Inpatient Diabetes Program Recommendations  AACE/ADA: New Consensus Statement on Inpatient Glycemic Control (2015)  Target Ranges:  Prepandial:   less than 140 mg/dL      Peak postprandial:   less than 180 mg/dL (1-2 hours)      Critically ill patients:  140 - 180 mg/dL   Results for Christian Barrett, Christian Barrett (MRN [id number]) as of 08/28/2020 09:27  Ref. Range 08/27/2020 18:22 08/28/2020 06:08  Glucose Latest Ref Range: 70 - 99 mg/dL 027 (H) 253 (H)    Admit with: Acute asthma exacerbation like secondary to acute bronchitis  History: DM, Bipolar disorder, HTN  Home DM Meds: Metformin 500 mg BID  Current Orders: None yet     MD- Note patient getting Solumedrol 40 mg Q6 hours.  History of Type 2 DM taking Metformin at home.  Please order Novolog Moderate Correction Scale/ SSI (0-15 units) TID AC + HS    --Will follow patient during hospitalization--  Ambrose Finland RN, MSN, CDE Diabetes Coordinator Inpatient Glycemic Control Team Team Pager: [phone] (8a-5p)

## 2020-08-28 NOTE — Progress Notes (Signed)
[facility]  - [place] [facility]   PATIENT NAME: Christian Barrett    MR#:  [id number]  DATE OF BIRTH:  23-Oct-1985  SUBJECTIVE:   Seen in the ER came in with shortness of breath. Initially was on BiPAP due to asthma and bronchospasm now on 2 L nasal cannula sats are more than 92%. He is a bit uncomfortable in the hospital bed due to his severe obesity. Complains of peripheral neuropathy. Follows with Dr. Malvin Johns as outpatient REVIEW OF SYSTEMS:   Review of Systems  Constitutional: Negative for chills, fever and weight loss.  HENT: Negative for ear discharge, ear pain and nosebleeds.   Eyes: Negative for blurred vision, pain and discharge.  Respiratory: Positive for cough and shortness of breath. Negative for sputum production, wheezing and stridor.   Cardiovascular: Negative for chest pain, palpitations, orthopnea and PND.  Gastrointestinal: Negative for abdominal pain, diarrhea, nausea and vomiting.  Genitourinary: Negative for frequency and urgency.  Musculoskeletal: Negative for back pain and joint pain.  Neurological: Positive for tingling. Negative for sensory change, speech change, focal weakness and weakness.  Psychiatric/Behavioral: Negative for depression and hallucinations. The patient is not nervous/anxious.    Tolerating Diet:yes Tolerating PT:   DRUG ALLERGIES:   Allergies  Allergen Reactions  . Lamotrigine Rash    And welts And welts  And welts  . Tramadol Nausea And Vomiting and Other (See Comments)      Other reaction(s): Unknown    VITALS:  Blood pressure 137/73, pulse (!) 108, temperature 98.1 F (36.7 C), temperature source Oral, resp. rate 16, height 5\' 5"  (1.651 m), weight (!) 163.3 kg, SpO2 97 %.  PHYSICAL EXAMINATION:   Physical Exam  GENERAL:  34 y.o.-year-old patient lying in the bed with no acute distress. Morbidly obese HEENT: Head atraumatic, normocephalic. Oropharynx and nasopharynx clear.  NECK:  Supple, no jugular  venous distention. No thyroid enlargement, no tenderness.  LUNGS: Normal breath sounds bilaterally, no wheezing, rales, rhonchi. No use of accessory muscles of respiration.  CARDIOVASCULAR: S1, S2 normal. No murmurs, rubs, or gallops. Mild tachycardia ABDOMEN: Soft, nontender, nondistended. Bowel sounds present. No organomegaly or mass. Obesity EXTREMITIES: No cyanosis, clubbing or edema b/l.    NEUROLOGIC: Cranial nerves II through XII are intact. No focal Motor or sensory deficits b/l.   PSYCHIATRIC:  patient is alert and oriented x 3.  SKIN: No obvious rash, lesion, or ulcer.   LABORATORY PANEL:  CBC Recent Labs  Lab 08/28/20 0608  WBC 12.6*  HGB 14.4  HCT 43.0  PLT 332    Chemistries  Recent Labs  Lab 08/28/20 0608  NA 136  K 4.7  CL 101  CO2 21*  GLUCOSE 264*  BUN 12  CREATININE 0.69  CALCIUM 9.3   Cardiac Enzymes No results for input(s): TROPONINI in the last 168 hours. RADIOLOGY:  DG Chest 2 View  Result Date: 08/27/2020 CLINICAL DATA:  Shortness of breath for 2 weeks. EXAM: CHEST - 2 VIEW COMPARISON:  April 23, 2020 FINDINGS: The heart size and mediastinal contours are within normal limits. Both lungs are clear. The visualized skeletal structures are unremarkable. IMPRESSION: No active cardiopulmonary disease. Electronically Signed   By: April 25, 2020 M.D.   On: 08/27/2020 19:22   CT Angio Chest PE W and/or Wo Contrast  Result Date: 08/27/2020 CLINICAL DATA:  PE suspected, history of asthma, tachycardic and shortness of breath EXAM: CT ANGIOGRAPHY CHEST WITH CONTRAST TECHNIQUE: Multidetector CT imaging of the chest was performed using  the standard protocol during bolus administration of intravenous contrast. Multiplanar CT image reconstructions and MIPs were obtained to evaluate the vascular anatomy. CONTRAST:  OMNIPAQUE IOHEXOL 350 MG/ML SOLN COMPARISON:  Radiograph 08/27/2020 FINDINGS: Cardiovascular: Suboptimal opacification of the pulmonary arteries  with the central pulmonary arterial contrast bolus measuring only 175 Hounsfield units. No large central or proximal lobar filling defects. More distal assessment is limited by the poor opacification of the pulmonary arteries and extensive respiratory motion artifact. Central pulmonary arteries are normal caliber. Normal heart size. No pericardial effusion. The aortic root is suboptimally assessed given cardiac pulsation artifact. Atherosclerotic plaque within the normal caliber aorta. No acute luminal abnormality of the imaged aorta. No periaortic stranding or hemorrhage. Mediastinum/Nodes: No mediastinal fluid or gas. Normal thyroid gland and thoracic inlet. No acute abnormality of the trachea or esophagus. No worrisome mediastinal, hilar or axillary adenopathy. Lungs/Pleura: Diffuse mild airways thickening. No consolidation, features of edema, pneumothorax, or effusion. No suspicious pulmonary nodules or masses. Upper Abdomen: No acute abnormalities present in the visualized portions of the upper abdomen. Diffuse hepatic hypoattenuation compatible with hepatic steatosis. Sparing along the gallbladder fossa. Small accessory splenule seen anterior to the spleen. Musculoskeletal: No chest wall abnormality. No acute or significant osseous findings. Early discogenic changes in the spine. Review of the MIP images confirms the above findings. IMPRESSION: 1. Suboptimal opacification of the pulmonary arteries with the central pulmonary arterial contrast bolus measuring only 175 Hounsfield units. No large central or proximal lobar filling defects. More distal assessment is limited by the poor opacification and extensive respiratory motion artifact. 2. Diffuse mild airways thickening, could reflect reactive airways disease or bronchitis. 3. No other acute intrathoracic process. 4. Hepatic steatosis. 5. Aortic Atherosclerosis (ICD10-I70.0). Electronically Signed   By: Kreg Shropshire M.D.   On: 08/27/2020 23:05   ASSESSMENT  AND PLAN:  Ellie Spickler  is a 34 y.o. morbidly obese Caucasian male with a known history of asthma, hypertension, morbid obesity and bipolar disorder, who presented to the emergency with acute onset of worsening dyspnea with associated cough and wheezing over the last couple weeks that significantly worsened today.He was seen in urgent care as he was exposed to the case of COVID-19 but tested negative.  He was prescribed amoxicillin and Zithromax in addition to [employer]  1.  Acute on chronic asthma exacerbation like secondary to acute bronchitis. -contscheduled and as needed duo nebs. -IV steroid therapy --change to oral in am if improving --po levaquin - continue his budesonide inhaler and Singulair. -- Pulmonary consultation with Dr. Meredeth Ide. Case discussed with Dr. Meredeth Ide to see patient. He is followed by Dr. Meredeth Ide as outpatient. -- Patient was earlier on BiPAP now on 2 L nasal cannula with sats more than 92%. -- Does not wear oxygen at home  2.  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 Steroid induced hyperglycemia -on supplement coverage with NovoLog and will continue his metformin -- continue Neurontin.  3.  Essential hypertension. -on  Cardizem CD.  4.  Bipolar disorder and anxiety. -continue his Lexapro and Pamelor as well as Seroquel.  5.  DVT prophylaxis. -Subcutaneous Lovenox  6. Morbid obesity with severe sleep apnea -- patient has CPAP at home however he tells me he is intolerant to his mask. Currently he has no insurance or not able to get a different kind of mask to try.   Family communication : none Consults : pulmonary Dr. Meredeth Ide CODE STATUS: full DVT Prophylaxis : Lovenox  Status is: Inpatient  Remains inpatient appropriate because:Inpatient level of care appropriate due to severity of illness   Dispo: The patient is from: Home              Anticipated d/c is to: Home              Anticipated d/c date is: 1 day               Patient currently is not medically stable to d/c.        TOTAL TIME TAKING CARE OF THIS PATIENT: 25 minutes.  >50% time spent on counselling and coordination of care  Note: This dictation was prepared with Dragon dictation along with smaller phrase technology. Any transcriptional errors that result from this process are unintentional.  Enedina Finner M.D    [facility]   CC: Primary care physician; Patient, No Pcp PerPatient ID: [name], male   DOB: Apr 27, 1986, 33 y.o.   MRN: [id number]

## 2020-08-29 ENCOUNTER — Other Ambulatory Visit: Payer: Self-pay | Admitting: Family Medicine

## 2020-08-29 LAB — GLUCOSE, CAPILLARY
Glucose-Capillary: 341 mg/dL — ABNORMAL HIGH (ref 70–99)
Glucose-Capillary: 360 mg/dL — ABNORMAL HIGH (ref 70–99)

## 2020-08-29 MED ORDER — METHYLPREDNISOLONE 4 MG PO TBPK: ORAL_TABLET | ORAL | 0 refills | Status: DC

## 2020-08-29 MED ORDER — LEVOFLOXACIN 500 MG PO TABS: 500.0000 mg | ORAL_TABLET | Freq: Every day | ORAL | 0 refills | Status: DC

## 2020-08-29 NOTE — Discharge Summary (Signed)
Physician Discharge Summary  Christian Barrett ZOX:[id number] DOB: May 29, 1986 DOA: 08/27/2020  PCP: Patient, No Pcp Per  Admit date: 08/27/2020   Discharge date: 08/29/2020  Admitted From: Home.  Disposition: Home.  Recommendations for Outpatient Follow-up:  1. Follow up with PCP in 1-2 weeks. 2. Please obtain BMP/CBC in one week. 3. Advised to follow-up with Dr. Meredeth Ide in 2 weeks on January 4. 4. Advised to take prednisone taper as directed.   5. Advised to take Levaquin for 4 more days to complete 5-day treatment.  Home Health: None.  Equipment/Devices: None  Discharge Condition: Stable CODE STATUS:Full code Diet recommendation: Heart Healthy   Brief Summary / Hospital course: This 34 years old morbidly obese male with PMH significant for asthma, hypertension, morbid obesity and bipolar disorder who presented in the ED with acute onset of worsening shortness of breath associated with cough.  Patient reports having cough and intermittent shortness of breath for few weeks that has gotten worse today.  Patient was seen in the urgent care as he was exposed to the case of COVID-19 but tested negative.  He was prescribed amoxicillin and Zithromax in addition to the [employer].  Patient does not feel better despite antibiotics so he came to the emergency department.  He was found to have significant wheezing on exam.  He was admitted for asthma exacerbation.  Pulmonology was consulted,  Patient was continued on IV steroids and antibiotics.  Patient improved with continued treatment.  Patient feels better and wants to be discharged.  Patient was cleared from pulmonology to be discharged.  Patient is discharged on Levaquin for 4 more days and prednisone taper.   Patient also reported some abdominal discomfort associated with diarrhea which is resolved on the day of discharge.    He was managed for below problems   Discharge Diagnoses:  Active Problems:   Acute asthma exacerbation    Sinus tachycardia   Bronchitis   Obesity hypoventilation syndrome (HCC)   Type 2 diabetes mellitus with diabetic neuropathy, without long-term current use of insulin (HCC)  1. Acute on chronic asthma exacerbation like secondary to acute bronchitis: - Continue with scheduled and as needed duo nebs. - IV steroid therapy --changed to p.o. prednisone -- Continue Levaquin for 5 days - continue his budesonide inhaler and Singulair. -- Pulmonary consultation with Dr. Meredeth Ide.  -- Patient was earlier on BiPAP now on 2 L nasal cannula with sats more than 92%. -- Does not wear oxygen at home. -  Pulmonology consulted recommended to continue steroids and change to prednisone on discharge.  2. 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 Steroid induced hyperglycemia -on supplement coverage with NovoLog and will continue his metformin -- continue Neurontin.  3. Essential hypertension. -Continue Cardizem CD.  4. Bipolar disorder and anxiety. -continue his Lexapro and Pamelor as well as Seroquel.  5. DVT prophylaxis. -Subcutaneous Lovenox  6. Morbid obesity with severe sleep apnea -- patient has CPAP at home however he tells me he is intolerant to his mask.  Currently he has no insurance or not able to get a different kind of mask to try.    Discharge Instructions  Discharge Instructions    Call MD for:  difficulty breathing, headache or visual disturbances   Complete by: As directed    Call MD for:  persistant dizziness or light-headedness   Complete by: As directed    Call MD for:  persistant nausea and vomiting   Complete by: As directed  Diet - low sodium heart healthy   Complete by: As directed    Diet Carb Modified   Complete by: As directed    Discharge instructions   Complete by: As directed    Advised to follow-up with primary care physician in 1 week. Advised to follow-up with Dr. Meredeth Ide in 2 weeks on January 4. Advised to take  prednisone taper is directed.   Advised to take Levaquin for 4 more days to complete 5-day treatment.   Increase activity slowly   Complete by: As directed      Allergies as of 08/29/2020      Reactions   Lamotrigine Rash   And welts And welts And welts   Tramadol Nausea And Vomiting, Other (See Comments)   Other reaction(s): Unknown      Medication List    STOP taking these medications   albuterol (2.5 MG/3ML) 0.083% nebulizer solution Commonly known as: PROVENTIL   albuterol 108 (90 Base) MCG/ACT inhaler Commonly known as: VENTOLIN HFA   amoxicillin 500 MG capsule Commonly known as: AMOXIL   azithromycin 250 MG tablet Commonly known as: ZITHROMAX   benzonatate 100 MG capsule Commonly known as: Tessalon Perles   Budesonide 90 MCG/ACT inhaler   diltiazem 240 MG 24 hr capsule Commonly known as: Cardizem CD   escitalopram 10 MG tablet Commonly known as: LEXAPRO   fluticasone 50 MCG/ACT nasal spray Commonly known as: FLONASE   Fluticasone-Salmeterol 250-50 MCG/DOSE Aepb Commonly known as: Advair Diskus   levocetirizine 5 MG tablet Commonly known as: Xyzal   montelukast 10 MG tablet Commonly known as: SINGULAIR   nitroGLYCERIN 0.4 MG SL tablet Commonly known as: Nitrostat   ondansetron 4 MG tablet Commonly known as: Zofran   pantoprazole 20 MG tablet Commonly known as: Protonix   predniSONE 10 MG tablet Commonly known as: DELTASONE     TAKE these medications   etodolac 500 MG tablet Commonly known as: LODINE Take 500 mg by mouth 2 (two) times daily.   furosemide 40 MG tablet Commonly known as: LASIX Take 1 tablet (40 mg total) by mouth daily. What changed:   how much to take  Another medication with the same name was removed. Continue taking this medication, and follow the directions you see here.   gabapentin 100 MG capsule Commonly known as: NEURONTIN Take 100 mg twice a day for one week, then increase to 200 mg(2 tablets) twice a day  and continue   ipratropium-albuterol 0.5-2.5 (3) MG/3ML Soln Commonly known as: DUONEB Take 3 mLs by nebulization 3 (three) times daily as needed.   levofloxacin 500 MG tablet Commonly known as: LEVAQUIN Take 1 tablet (500 mg total) by mouth daily. Start taking on: August 30, 2020   lisinopril 10 MG tablet Commonly known as: ZESTRIL Take 1 tablet (10 mg total) by mouth daily.   metFORMIN 500 MG 24 hr tablet Commonly known as: GLUCOPHAGE-XR Take 1 tablet (500 mg total) by mouth in the morning and at bedtime.   methylPREDNISolone 4 MG Tbpk tablet Commonly known as: MEDROL DOSEPAK Advised to take it as directed.       Follow-up Information    End, Cristal Deer, MD. Go on 09/05/2020.   Specialty: Cardiology Why: at 4:00pm Contact information: [address] [place] [address] [phone]        Mertie Moores, MD. Go on 09/14/2020.   Specialty: Specialist Why: at 2:00pm; call the office if need to reschedule Contact information: [address]  [place] [address] [phone]              Allergies  Allergen Reactions  . Lamotrigine Rash    And welts And welts  And welts  . Tramadol Nausea And Vomiting and Other (See Comments)      Other reaction(s): Unknown    Consultations:  Pulmonology   Procedures/Studies: DG Chest 2 View  Result Date: 08/27/2020 CLINICAL DATA:  Shortness of breath for 2 weeks. EXAM: CHEST - 2 VIEW COMPARISON:  April 23, 2020 FINDINGS: The heart size and mediastinal contours are within normal limits. Both lungs are clear. The visualized skeletal structures are unremarkable. IMPRESSION: No active cardiopulmonary disease. Electronically Signed   By: Aram Candela M.D.   On: 08/27/2020 19:22   CT Angio Chest PE W and/or Wo Contrast  Result Date: 08/27/2020 CLINICAL DATA:  PE suspected, history of asthma, tachycardic and shortness of breath EXAM: CT ANGIOGRAPHY CHEST WITH CONTRAST TECHNIQUE:  Multidetector CT imaging of the chest was performed using the standard protocol during bolus administration of intravenous contrast. Multiplanar CT image reconstructions and MIPs were obtained to evaluate the vascular anatomy. CONTRAST:  OMNIPAQUE IOHEXOL 350 MG/ML SOLN COMPARISON:  Radiograph 08/27/2020 FINDINGS: Cardiovascular: Suboptimal opacification of the pulmonary arteries with the central pulmonary arterial contrast bolus measuring only 175 Hounsfield units. No large central or proximal lobar filling defects. More distal assessment is limited by the poor opacification of the pulmonary arteries and extensive respiratory motion artifact. Central pulmonary arteries are normal caliber. Normal heart size. No pericardial effusion. The aortic root is suboptimally assessed given cardiac pulsation artifact. Atherosclerotic plaque within the normal caliber aorta. No acute luminal abnormality of the imaged aorta. No periaortic stranding or hemorrhage. Mediastinum/Nodes: No mediastinal fluid or gas. Normal thyroid gland and thoracic inlet. No acute abnormality of the trachea or esophagus. No worrisome mediastinal, hilar or axillary adenopathy. Lungs/Pleura: Diffuse mild airways thickening. No consolidation, features of edema, pneumothorax, or effusion. No suspicious pulmonary nodules or masses. Upper Abdomen: No acute abnormalities present in the visualized portions of the upper abdomen. Diffuse hepatic hypoattenuation compatible with hepatic steatosis. Sparing along the gallbladder fossa. Small accessory splenule seen anterior to the spleen. Musculoskeletal: No chest wall abnormality. No acute or significant osseous findings. Early discogenic changes in the spine. Review of the MIP images confirms the above findings. IMPRESSION: 1. Suboptimal opacification of the pulmonary arteries with the central pulmonary arterial contrast bolus measuring only 175 Hounsfield units. No large central or proximal lobar filling  defects. More distal assessment is limited by the poor opacification and extensive respiratory motion artifact. 2. Diffuse mild airways thickening, could reflect reactive airways disease or bronchitis. 3. No other acute intrathoracic process. 4. Hepatic steatosis. 5. Aortic Atherosclerosis (ICD10-I70.0). Electronically Signed   By: Kreg Shropshire M.D.   On: 08/27/2020 23:05    Subjective: Patient was seen and examined at bedside.  Overnight events noted.  Patient reports feeling much better,  wheezing has resolved.   Lung sounds clear,  patient feels better and wants to be discharged.  Discharge Exam: Vitals:   08/29/20 0826 08/29/20 1249  BP: (!) 157/78 (!) 155/73  Pulse: 97 (!) 110  Resp: 18 18  Temp: 97.8 F (36.6 C) 98.3 F (36.8 C)  SpO2: 98% 97%   Vitals:   08/29/20 0610 08/29/20 0630 08/29/20 0826 08/29/20 1249  BP:   (!) 157/78 (!) 155/73  Pulse:   97 (!) 110  Resp: Temp:   97.8  F (36.6 C) 98.3 F (36.8 C)  TempSrc:   Oral Oral  SpO2:   98% 97%  Weight:      Height:        General: Pt is alert, awake, not in acute distress Cardiovascular: RRR, S1/S2 +, no rubs, no gallops Respiratory: CTA bilaterally, no wheezing, no rhonchi Abdominal: Soft, NT, ND, bowel sounds + Extremities: no edema, no cyanosis    The results of significant diagnostics from this hospitalization (including imaging, microbiology, ancillary and laboratory) are listed below for reference.     Microbiology: Recent Results (from the past 240 hour(s))  Resp Panel by RT-PCR (Flu A&B, Covid) Nasopharyngeal Swab     Status: None   Collection Time: 08/27/20  6:00 PM   Specimen: Nasopharyngeal Swab; Nasopharyngeal(NP) swabs in vial transport medium  Result Value Ref Range Status   SARS Coronavirus 2 by RT PCR NEGATIVE NEGATIVE Final    Comment: (NOTE) SARS-CoV-2 target nucleic acids are NOT DETECTED.  The SARS-CoV-2 RNA is generally detectable in upper respiratory specimens during the  acute phase of infection. The lowest concentration of SARS-CoV-2 viral copies this assay can detect is 138 copies/mL. A negative result does not preclude SARS-Cov-2 infection and should not be used as the sole basis for treatment or other patient management decisions. A negative result may occur with  improper specimen collection/handling, submission of specimen other than nasopharyngeal swab, presence of viral mutation(s) within the areas targeted by this assay, and inadequate number of viral copies(<138 copies/mL). A negative result must be combined with clinical observations, patient history, and epidemiological information. The expected result is Negative.  Fact Sheet for Patients:  BloggerCourse.com  Fact Sheet for Healthcare Providers:  SeriousBroker.it  This test is no t yet approved or cleared by the [place] FDA and  has been authorized for detection and/or diagnosis of SARS-CoV-2 by FDA under an Emergency Use Authorization (EUA). This EUA will remain  in effect (meaning this test can be used) for the duration of the COVID-19 declaration under Section 564(b)(1) of the Act, 21 U.S.C.section 360bbb-3(b)(1), unless the authorization is terminated  or revoked sooner.       Influenza A by PCR NEGATIVE NEGATIVE Final   Influenza B by PCR NEGATIVE NEGATIVE Final    Comment: (NOTE) The Xpert Xpress SARS-CoV-2/FLU/RSV plus assay is intended as an aid in the diagnosis of influenza from Nasopharyngeal swab specimens and should not be used as a sole basis for treatment. Nasal washings and aspirates are unacceptable for Xpert Xpress SARS-CoV-2/FLU/RSV testing.  Fact Sheet for Patients: BloggerCourse.com  Fact Sheet for Healthcare Providers: SeriousBroker.it  This test is not yet approved or cleared by the [place] FDA and has been authorized for detection and/or  diagnosis of SARS-CoV-2 by FDA under an Emergency Use Authorization (EUA). This EUA will remain in effect (meaning this test can be used) for the duration of the COVID-19 declaration under Section 564(b)(1) of the Act, 21 U.S.C. section 360bbb-3(b)(1), unless the authorization is terminated or revoked.  Performed at [facility], [address] [place], [place] [address]      Labs: BNP (last 3 results) Recent Labs    11/07/19 0854 04/23/20 1905  BNP 8.5 33.1   Basic Metabolic Panel: Recent Labs  Lab 08/27/20 1822 08/28/20 0608  NA 135 136  K 4.1 4.7  CL 96* 101  CO2 27 21*  GLUCOSE 241* 264*  BUN 12 12  CREATININE 0.73 0.69  CALCIUM 9.2 9.3   Liver  Function Tests: No results for input(s): AST, ALT, ALKPHOS, BILITOT, PROT, ALBUMIN in the last 168 hours. Recent Labs  Lab 08/28/20 0608  AMYLASE 45   No results for input(s): AMMONIA in the last 168 hours. CBC: Recent Labs  Lab 08/27/20 1822 08/28/20 0608  WBC 11.0* 12.6*  HGB 14.1 14.4  HCT 41.3 43.0  MCV 81.3 81.9  PLT 319 332   Cardiac Enzymes: No results for input(s): CKTOTAL, CKMB, CKMBINDEX, TROPONINI in the last 168 hours. BNP: Invalid input(s): POCBNP CBG: Recent Labs  Lab 08/28/20 2051 08/29/20 0828 08/29/20 1247  GLUCAP 391* 341* 360*   D-Dimer No results for input(s): DDIMER in the last 72 hours. Hgb A1c No results for input(s): HGBA1C in the last 72 hours. Lipid Profile No results for input(s): CHOL, HDL, LDLCALC, TRIG, CHOLHDL, LDLDIRECT in the last 72 hours. Thyroid function studies No results for input(s): TSH, T4TOTAL, T3FREE, THYROIDAB in the last 72 hours.  Invalid input(s): FREET3 Anemia work up No results for input(s): VITAMINB12, FOLATE, FERRITIN, TIBC, IRON, RETICCTPCT in the last 72 hours. Urinalysis    Component Value Date/Time   COLORURINE YELLOW (A) 08/27/2020 1800   APPEARANCEUR CLEAR (A) 08/27/2020 1800   LABSPEC 1.018 08/27/2020 1800   PHURINE 6.0  08/27/2020 1800   GLUCOSEU >=500 (A) 08/27/2020 1800   HGBUR NEGATIVE 08/27/2020 1800   BILIRUBINUR NEGATIVE 08/27/2020 1800   KETONESUR NEGATIVE 08/27/2020 1800   PROTEINUR NEGATIVE 08/27/2020 1800   NITRITE NEGATIVE 08/27/2020 1800   LEUKOCYTESUR NEGATIVE 08/27/2020 1800   Sepsis Labs Invalid input(s): PROCALCITONIN,  WBC,  LACTICIDVEN Microbiology Recent Results (from the past 240 hour(s))  Resp Panel by RT-PCR (Flu A&B, Covid) Nasopharyngeal Swab     Status: None   Collection Time: 08/27/20  6:00 PM   Specimen: Nasopharyngeal Swab; Nasopharyngeal(NP) swabs in vial transport medium  Result Value Ref Range Status   SARS Coronavirus 2 by RT PCR NEGATIVE NEGATIVE Final    Comment: (NOTE) SARS-CoV-2 target nucleic acids are NOT DETECTED.  The SARS-CoV-2 RNA is generally detectable in upper respiratory specimens during the acute phase of infection. The lowest concentration of SARS-CoV-2 viral copies this assay can detect is 138 copies/mL. A negative result does not preclude SARS-Cov-2 infection and should not be used as the sole basis for treatment or other patient management decisions. A negative result may occur with  improper specimen collection/handling, submission of specimen other than nasopharyngeal swab, presence of viral mutation(s) within the areas targeted by this assay, and inadequate number of viral copies(<138 copies/mL). A negative result must be combined with clinical observations, patient history, and epidemiological information. The expected result is Negative.  Fact Sheet for Patients:  BloggerCourse.com  Fact Sheet for Healthcare Providers:  SeriousBroker.it  This test is no t yet approved or cleared by the [place] FDA and  has been authorized for detection and/or diagnosis of SARS-CoV-2 by FDA under an Emergency Use Authorization (EUA). This EUA will remain  in effect (meaning this test can be used)  for the duration of the COVID-19 declaration under Section 564(b)(1) of the Act, 21 U.S.C.section 360bbb-3(b)(1), unless the authorization is terminated  or revoked sooner.       Influenza A by PCR NEGATIVE NEGATIVE Final   Influenza B by PCR NEGATIVE NEGATIVE Final    Comment: (NOTE) The Xpert Xpress SARS-CoV-2/FLU/RSV plus assay is intended as an aid in the diagnosis of influenza from Nasopharyngeal swab specimens and should not be used as a sole basis for treatment. Nasal  washings and aspirates are unacceptable for Xpert Xpress SARS-CoV-2/FLU/RSV testing.  Fact Sheet for Patients: BloggerCourse.comhttps://www.fda.gov/media/152166/download  Fact Sheet for Healthcare Providers: SeriousBroker.ithttps://www.fda.gov/media/152162/download  This test is not yet approved or cleared by the [place] FDA and has been authorized for detection and/or diagnosis of SARS-CoV-2 by FDA under an Emergency Use Authorization (EUA). This EUA will remain in effect (meaning this test can be used) for the duration of the COVID-19 declaration under Section 564(b)(1) of the Act, 21 U.S.C. section 360bbb-3(b)(1), unless the authorization is terminated or revoked.  Performed at [facility], [address] [place], [place] [id number]      Time coordinating discharge: Over 30 minutes  SIGNED:   Cipriano BunkerPARDEEP Emarie Paul, MD  [facility] 08/29/2020, 3:31 PM Pager   If 7PM-7AM, please contact night-coverage [email or web address]

## 2020-08-29 NOTE — Discharge Instructions (Signed)
Asthma, Adult  Asthma is a long-term (chronic) condition in which the airways get tight and narrow. The airways are the breathing passages that lead from the nose and mouth down into the lungs. A person with asthma will have times when symptoms get worse. These are called asthma attacks. They can cause coughing, whistling sounds when you breathe (wheezing), shortness of breath, and chest pain. They can make it hard to breathe. There is no cure for asthma, but medicines and lifestyle changes can help control it. There are many things that can bring on an asthma attack or make asthma symptoms worse (triggers). Common triggers include:  Mold.  Dust.  Cigarette smoke.  Cockroaches.  Things that can cause allergy symptoms (allergens). These include animal skin flakes (dander) and pollen from trees or grass.  Things that pollute the air. These may include household cleaners, wood smoke, smog, or chemical odors.  Cold air, weather changes, and wind.  Crying or laughing hard.  Stress.  Certain medicines or drugs.  Certain foods such as dried fruit, potato chips, and grape juice.  Infections, such as a cold or the flu.  Certain medical conditions or diseases.  Exercise or tiring activities. Asthma may be treated with medicines and by staying away from the things that cause asthma attacks. Types of medicines may include:  Controller medicines. These help prevent asthma symptoms. They are usually taken every day.  Fast-acting reliever or rescue medicines. These quickly relieve asthma symptoms. They are used as needed and provide short-term relief.  Allergy medicines if your attacks are brought on by allergens.  Medicines to help control the body's defense (immune) system. Follow these instructions at home: Avoiding triggers in your home  Change your heating and air conditioning filter often.  Limit your use of fireplaces and wood stoves.  Get rid of pests (such as roaches and  mice) and their droppings.  Throw away plants if you see mold on them.  Clean your floors. Dust regularly. Use cleaning products that do not smell.  Have someone vacuum when you are not home. Use a vacuum cleaner with a HEPA filter if possible.  Replace carpet with wood, tile, or vinyl flooring. Carpet can trap animal skin flakes and dust.  Use allergy-proof pillows, mattress covers, and box spring covers.  Wash bed sheets and blankets every week in hot water. Dry them in a dryer.  Keep your bedroom free of any triggers.  Avoid pets and keep windows closed when things that cause allergy symptoms are in the air.  Use blankets that are made of polyester or cotton.  Clean bathrooms and kitchens with bleach. If possible, have someone repaint the walls in these rooms with mold-resistant paint. Keep out of the rooms that are being cleaned and painted.  Wash your hands often with soap and water. If soap and water are not available, use hand sanitizer.  Do not allow anyone to smoke in your home. General instructions  Take over-the-counter and prescription medicines only as told by your doctor. ? Talk with your doctor if you have questions about how or when to take your medicines. ? Make note if you need to use your medicines more often than usual.  Do not use any products that contain nicotine or tobacco, such as cigarettes and e-cigarettes. If you need help quitting, ask your doctor.  Stay away from secondhand smoke.  Avoid doing things outdoors when allergen counts are high and when air quality is low.  Wear a ski  mask when doing outdoor activities in the winter. The mask should cover your nose and mouth. Exercise indoors on cold days if you can.  Warm up before you exercise. Take time to cool down after exercise.  Use a peak flow meter as told by your doctor. A peak flow meter is a tool that measures how well the lungs are working.  Keep track of the peak flow meter's readings.  Write them down.  Follow your asthma action plan. This is a written plan for taking care of your asthma and treating your attacks.  Make sure you get all the shots (vaccines) that your doctor recommends. Ask your doctor about a flu shot and a pneumonia shot.  Keep all follow-up visits as told by your doctor. This is important. Contact a doctor if:  You have wheezing, shortness of breath, or a cough even while taking medicine to prevent attacks.  The mucus you cough up (sputum) is thicker than usual.  The mucus you cough up changes from clear or white to yellow, green, gray, or bloody.  You have problems from the medicine you are taking, such as: ? A rash. ? Itching. ? Swelling. ? Trouble breathing.  You need reliever medicines more than 2-3 times a week.  Your peak flow reading is still at 50-79% of your personal best after following the action plan for 1 hour.  You have a fever. Get help right away if:  You seem to be worse and are not responding to medicine during an asthma attack.  You are short of breath even at rest.  You get short of breath when doing very little activity.  You have trouble eating, drinking, or talking.  You have chest pain or tightness.  You have a fast heartbeat.  Your lips or fingernails start to turn blue.  You are light-headed or dizzy, or you faint.  Your peak flow is less than 50% of your personal best.  You feel too tired to breathe normally. Summary  Asthma is a long-term (chronic) condition in which the airways get tight and narrow. An asthma attack can make it hard to breathe.  Asthma cannot be cured, but medicines and lifestyle changes can help control it.  Make sure you understand how to avoid triggers and how and when to use your medicines. This information is not intended to replace advice given to you by your health care provider. Make sure you discuss any questions you have with your health care provider. Document Revised:  10/28/2018 Document Reviewed: 09/29/2016 Else[employer] Patient Education  2020 [employer]   Asthma Attack  Acute bronchospasm caused by asthma is also referred to as an asthma attack. Bronchospasm means that the air passages become narrowed or "tight," which limits the amount of oxygen that can get into the lungs. The narrowing is caused by inflammation and tightening of the muscles in the air tubes (bronchi) in the lungs. Excessive mucus is also produced, which narrows the airways more. This can cause trouble breathing, coughing, and loud breathing (wheezing). What are the causes? Possible triggers include:  Animal dander from the skin, hair, or feathers of animals.  Dust mites contained in house dust.  Cockroaches.  Pollen from trees or grass.  Mold.  Cigarette or tobacco smoke.  Air pollutants such as dust, household cleaners, hair sprays, aerosol sprays, paint fumes, strong chemicals, or strong odors.  Cold air or weather changes. Cold air may trigger inflammation. Winds increase molds and pollens in the air.  Strong  emotions such as crying or laughing hard.  Stress.  Certain medicines, such as aspirin or beta-blockers.  Sulfites in foods and drinks, such as dried fruits and wine.  Infections or inflammatory conditions, such as a flu, a cold, pneumonia, or inflammation of the nasal membranes (rhinitis).  Gastroesophageal reflux disease (GERD). GERD is a condition in which stomach acid backs up into your esophagus, which can irritate nearby airway structures.  Exercise or activity that requires a lot of energy. What are the signs or symptoms? Symptoms of this condition include:  Wheezing. This may sound like whistling while breathing. This may be more noticeable at night.  Excessive coughing, particularly at night.  Chest tightness or pain.  Shortness of breath.  Feeling like you cannot get enough air no matter how hard you try (air hunger). How is this  diagnosed? This condition may be diagnosed based on:  Your medical history.  Your symptoms.  A physical exam.  Tests to check for other causes of your symptoms or other conditions that may have triggered your asthma attack. These tests may include: ? Chest X-ray. ? Blood tests. ? Specialized tests to assess lung function, such as breathing into a device that measures how much air you inhale and exhale (spirometry). How is this treated? The goal of treatment is to open the airways in your lungs and reduce inflammation. Most asthma attacks are treated with medicines that you inhale through a hand-held inhaler (metered dose inhaler, MDI) or a device that turns liquid medicine into a mist that you inhale (nebulizer). Medicines may include:  Quick relief or rescue medicines that relax the muscles of the bronchi. These medicines include bronchodilators, such as albuterol.  Controller medicines, such as inhaled corticosteroids. These are long-acting medicines that are used for daily asthma maintenance. If you have a moderate or severe asthma attack, you may be treated with steroid medicines by mouth or through an IV injection at the hospital. Steroid medicines reduce inflammation in your lungs. Depending on the severity of your attack, you may need oxygen therapy to help you breathe. If your asthma attack was caused by a bacterial infection, such as pneumonia, you will be given antibiotic medicines. Follow these instructions at home: Medicines  Take over-the-counter and prescription medicines only as told by your health care provider. Keep your medicines up-to-date and available.  If you are more than [redacted] weeks pregnant and you are prescribed any new medicines, tell your obstetrician about those medicines.  If you were prescribed an antibiotic medicine, take it as told by your health care provider. Do not stop taking the antibiotic even if you start to feel better. Avoiding triggers   Keep  track of things that trigger your asthma attacks or cause you to have breathing problems, and avoid exposure to these triggers.  Do not use any products that contain nicotine or tobacco, such as cigarettes and e-cigarettes. If you need help quitting, ask your health care provider.  Avoid secondhand smoke.  Avoid strong smells, such as perfumes, aerosols, and cleaning solvents.  When pollen or air pollution is bad, keep windows closed and use an air conditioner or go to places with air conditioning. Asthma action plan  Work with your health care provider to make a written plan for managing and treating your asthma attacks (asthma action plan). This plan should include: ? A list of your asthma triggers and how to avoid them. ? Information about when your medicines should be taken and when their dosage should  be changed. ? Instructions about using a device called a peak flow meter to monitor your condition. A peak flow meter measures how well your lungs are working and measures how severe your asthma is at a given time. Your "personal best" is the highest peak flow rate you can reach when you feel good and have no asthma symptoms. General instructions  Avoid excessive exercise or activity until your asthma attack resolves. Ask your health care provider what activities are safe for you and when you can return to your normal activities.  Stay up to date on all vaccinations recommended by your health care provider, such as flu and pneumonia vaccines.  Drink enough fluid to keep your urine clear or pale yellow. Staying hydrated helps keep mucus in your lungs thin so it can be coughed up easily.  If you drink caffeine, do so in moderation.  Do not use alcohol until you have recovered.  Keep all follow-up visits as told by your health care provider. This is important. Asthma requires careful medical care, and you and your health care provider can work together to reduce the likelihood of future  attacks. Contact a health care provider if:  Your peak flow reading is still at 50-79% of your personal best after you have followed your action plan for 1 hour. This is in the yellow zone, which means "caution."  You need to use a reliever medicine more than 2-3 times a week.  Your medicines are causing side effects, such as: ? Rash. ? Itching. ? Swelling. ? Trouble breathing.  Your symptoms do not improve after 48 hours.  You cough up mucus (sputum) that is thicker than usual.  You have a fever.  You need to use your medicines much more frequently than normal. Get help right away if:  Your peak flow reading is less than 50% of your personal best. This is in the red zone, which means "danger."  You have severe trouble breathing.  You develop chest pain or discomfort.  Your medicines no longer seem to be helping.  You vomit.  You cannot eat or drink without vomiting.  You are coughing up yellow, green, brown, or bloody mucus.  You have a fever and your symptoms suddenly get worse.  You have trouble swallowing.  You feel very tired, and breathing becomes tiring. Summary  Acute bronchospasm caused by asthma is also referred to as an asthma attack.  Bronchospasm is caused by narrowing or tightness in air passages, which causes shortness of breath, coughing, and loud breathing (wheezing).  Many things can trigger an asthma attack, such as allergens, weather changes, exercise, smoke, and other fumes.  Treatment for an asthma attack may include inhaled rescue medicines for immediate relief, as well as the use of maintenance therapy.  Get help right away if you have worsening shortness of breath, chest pain, or fever, or if your home medicines are no longer helping with your symptoms. This information is not intended to replace advice given to you by your health care provider. Make sure you discuss any questions you have with your health care provider. Document Revised:  12/14/2018 Document Reviewed: 09/26/2016 Elsevier Patient Education  2020 [employer]   Asthma and Physical Activity Physical activity is an important part of a healthy lifestyle. If you have asthma, it is important to exercise because physical activity can help you to:  Control your asthma.  Maintain your weight or lose weight.  Increase your energy.  Decrease stress and anxiety.  Lower your risk of getting sick.  Improve your heart health. However, asthma symptoms can flare up when you are physically active or exercising. You can learn how to control your asthma and prevent symptoms during exercise. This will help you to remain physically active. How can asthma affect my ability to be physically active? When you have asthma, physical activity can cause you to have symptoms such as:  Wheezing. This may sound like whistling while breathing.  A feeling of tightness in the chest.  Sore throat.  Coughing.  Shortness of breath.  Tiredness (fatigue) with minimal activity.  Increased sputum production.  Chest pain. What actions can I take to prevent asthma problems during physical activity? Pulmonary rehabilitation Enroll in a pulmonary rehabilitation program. Benefits of this type of program include:  Education on lung diseases.  Classes that teach you how to exercise and be more active while decreasing your shortness of breath.  A group setting that allows you to talk with others who have asthma. Asthma action plan Follow the asthma action plan set by your health care provider. Your personal asthma plan may include:  Taking your medicines as told by your health care provider.  Avoiding your asthma triggers, except physical activity. Triggers may include cold air, dust, pollen, pet dander, and air pollution.  Tracking your asthma control.  Using a peak flow meter.  Being aware of worsening symptoms.  Knowing when to seek emergency care. Proper  breathing During exercise, follow these tips for proper breathing:  Breathe in before starting the exercise and breathe out during the hardest part of the exercise.  Take slow breaths.  Pace yourself and do not try to go too fast.  While breathing out, purse your lips. Before beginning any exercise program or new activity, talk with your health care provider. Medicines If physical activity triggers your asthma, your health care provider may order the following medicines:  A rescue inhaler (short-acting beta2-agonist) for you to use shortly before physical activity or exercise. Its effects may reduce exercise-related symptoms for 2-3 hours.  A long-acting beta2-agonist that can offer up to 12 hours of relief if taken daily.  Leukotriene modifiers. These pills are taken several hours before physical activity or exercise to help prevent asthma symptoms that are caused by exercise.  Long-term control medicines. These will be given if you have severe or frequent asthma symptoms during or after exercise. These symptoms may also mean that your asthma is not well controlled.  General information  Exercise indoors when the air is dry or during allergy season.  Try to breathe in warm, moist air by wearing a scarf over your nose and mouth or breathing only through your nose.  Spend a few minutes warming up before your workout.  Cool down after exercise. What should I do if my asthma symptoms get worse? Contact your health care provider if your asthma symptoms are getting worse. Your asthma is getting worse if:  You have symptoms more often.  Your symptoms are more severe.  Your symptoms get worse at night and make you lose sleep.  Your peak flow number is lower than your personal best or changes a lot from day to day.  Your asthma medicines do not work as well as they used to.  You use your rescue inhaler more often. If you use your rescue inhaler more than 2 days a week, your asthma  is not well controlled.  You go to the emergency room or see your health care  provider because of an asthma attack. Where to find support  Ask your health care provider about signing up for a pulmonary rehabilitation program.  Ask your health care provider about asthma support groups.  Visit your local community health department.  Check out local hospitals' community health programs. Where can I get more information?  Your health care provider.  American Lung Association: lung.org  National Heart, Lung, and Blood Institute: [employer] Contact a health care provider if:  You have trouble walking and talking because you are out of breath. Get help right away if:  Your lips or fingernails are blue.  You are not able to breathe or catch your breath. Summary  Physical activity is an important part of a healthy lifestyle. However, if you have asthma, your symptoms can flare up during exercise or physical activity.  You can prevent problems during physical activity by doing pulmonary rehabilitation, following an asthma action plan, doing proper breathing, and using medicines.  Talk with your health care provider before starting any exercise program or new activity. This information is not intended to replace advice given to you by your health care provider. Make sure you discuss any questions you have with your health care provider. Document Revised: 12/14/2018 Document Reviewed: 11/10/2017 Elsevier Patient Education  2020 [employer]   Asthma Attack Prevention, Adult Although you may not be able to control the fact that you have asthma, you can take actions to prevent episodes of asthma (asthma attacks). These actions include:  Creating a written plan for managing and treating your asthma attacks (asthma action plan).  Monitoring your asthma.  Avoiding things that can irritate your airways or make your asthma symptoms worse (asthma triggers).  Taking your medicines as  directed.  Acting quickly if you have signs or symptoms of an asthma attack. What are some ways to prevent an asthma attack? Create a plan Work with your health care provider to create an asthma action plan. This plan should include:  A list of your asthma triggers and how to avoid them.  A list of symptoms that you experience during an asthma attack.  Information about when to take medicine and how much medicine to take.  Information to help you understand your peak flow measurements.  Contact information for your health care providers.  Daily actions that you can take to control asthma. Monitor your asthma To monitor your asthma:  Use your peak flow meter every morning and every evening for 2-3 weeks. Record the results in a journal. A drop in your peak flow numbers on one or more days may mean that you are starting to have an asthma attack, even if you are not having symptoms.  When you have asthma symptoms, write them down in a journal.  Avoid asthma triggers Work with your health care provider to find out what your asthma triggers are. This can be done by:  Being tested for allergies.  Keeping a journal that notes when asthma attacks occur and what may have contributed to them.  Asking your health care provider whether other medical conditions make your asthma worse. Common asthma triggers include:  Dust.  Smoke. This includes campfire smoke and secondhand smoke from tobacco products.  Pet dander.  Trees, grasses or pollens.  Very cold, dry, or humid air.  Mold.  Foods that contain high amounts of sulfites.  Strong smells.  Engine exhaust and air pollution.  Aerosol sprays and fumes from household cleaners.  Household pests and their droppings, including dust  mites and cockroaches.  Certain medicines, including NSAIDs. Once you have determined your asthma triggers, take steps to avoid them. Depending on your triggers, you may be able to reduce the chance  of an asthma attack by:  Keeping your home clean. Have someone dust and vacuum your home for you 1 or 2 times a week. If possible, have them use a high-efficiency particulate arrestance (HEPA) vacuum.  Washing your sheets weekly in hot water.  Using allergy-proof mattress covers and casings on your bed.  Keeping pets out of your home.  Taking care of mold and water problems in your home.  Avoiding areas where people smoke.  Avoiding using strong perfumes or odor sprays.  Avoid spending a lot of time outdoors when pollen counts are high and on very windy days.  Talking with your health care provider before stopping or starting any new medicines. Medicines Take over-the-counter and prescription medicines only as told by your health care provider. Many asthma attacks can be prevented by carefully following your medicine schedule. Taking your medicines correctly is especially important when you cannot avoid certain asthma triggers. Even if you are doing well, do not stop taking your medicine and do not take less medicine. Act quickly If an asthma attack happens, acting quickly can decrease how severe it is and how long it lasts. Take these actions:  Pay attention to your symptoms. If you are coughing, wheezing, or having difficulty breathing, do not wait to see if your symptoms go away on their own. Follow your asthma action plan.  If you have followed your asthma action plan and your symptoms are not improving, call your health care provider or seek immediate medical care at the nearest hospital. It is important to write down how often you need to use your fast-acting rescue inhaler. You can track how often you use an inhaler in your journal. If you are using your rescue inhaler more often, it may mean that your asthma is not under control. Adjusting your asthma treatment plan may help you to prevent future asthma attacks and help you to gain better control of your condition. How can I  prevent an asthma attack when I exercise? Exercise is a common asthma trigger. To prevent asthma attacks during exercise:  Follow advice from your health care provider about whether you should use your fast-acting inhaler before exercising. Many people with asthma experience exercise-induced bronchoconstriction (EIB). This condition often worsens during vigorous exercise in cold, humid, or dry environments. Usually, people with EIB can stay very active by using a fast-acting inhaler before exercising.  Avoid exercising outdoors in very cold or humid weather.  Avoid exercising outdoors when pollen counts are high.  Warm up and cool down when exercising.  Stop exercising right away if asthma symptoms start. Consider taking part in exercises that are less likely to cause asthma symptoms such as:  Indoor swimming.  Biking.  Walking.  Hiking.  Playing football. This information is not intended to replace advice given to you by your health care provider. Make sure you discuss any questions you have with your health care provider. Document Revised: 08/07/2017 Document Reviewed: 02/09/2016 [employer]vier Patient Education  2020 [employer]   Asthma Action Plan, Adult An asthma action plan helps you understand how to manage your asthma and what to do when you have an asthma attack. The action plan is a color-coded plan that lists the symptoms that indicate whether or not your condition is under control and what actions to take.  If you have symptoms in the green zone, it means you are doing well.  If you have symptoms in the yellow zone, it means you are having problems.  If you have symptoms in the red zone, you need medical care right away. Follow the plan that you and your health care provider develop. Review your plan with your health care provider at each visit. What triggers your asthma? Knowing the things that can trigger an asthma attack or make your asthma symptoms worse is very  important. Talk to your health care provider about your asthma triggers and how to avoid them. Record your known asthma triggers here: _______________ What is your personal best peak flow reading? If you use a peak flow meter, determine your personal best reading. Record it here: _______________ Red zone Symptoms in this zone mean that you should get medical help right away. You will likely feel distressed and have symptoms at rest that restrict your activity. You are in the red zone if:  You are breathing hard and quickly.  Your nose opens wide, your ribs show, and your neck muscles become visible when you breathe in.  Your lips, fingers, or toes are a bluish color.  You have trouble speaking in full sentences.  Your peak flow reading is less than __________ (less than 50% of your personal best).  Your symptoms do not improve within 15-20 minutes after you use your reliever or rescue medicine (bronchodilator). If you have any of these symptoms:  Call your local emergency services (911 in the [place] or go to the nearest emergency room.  Use your reliever or rescue medicine. ? Start a nebulizer treatment or take 2-4 puffs from a metered-dose inhaler with a spacer. ? Repeat this action every 15-20 minutes until help arrives. Yellow zone Symptoms in this zone mean that your condition may be getting worse. You may have symptoms that interfere with exercise, are noticeably worse after exposure to triggers, or are worse at the first sign of a cold (upper respiratory infection). These may include:  Waking from sleep.  Coughing, especially at night or first thing in the morning.  Mild wheezing.  Chest tightness.  A peak flow reading that is __________ to __________ (50-79% of your personal best). If you have any of these symptoms:  Add the following medicine to the ones that you use daily: ? Reliever or rescue medicine and dosage: _______________ ? Additional medicine and dosage:  _______________ Call your health care provider if:  You remain in the yellow zone for __________ hours.  You are using a reliever or rescue medicine more than 2-3 times a week. Green zone This zone means that your asthma is under control. You may not have any symptoms while you are in the green zone. This means that you:  Have no coughing or wheezing, even while you are working or playing.  Sleep through the night.  Are breathing well.  Have a peak flow reading that is above __________ (80% of your personal best or greater). If you are in the green zone, continue to manage your asthma as directed:  Take these medicines every day: ? Controller medicine and dosage: _______________ ? Controller medicine and dosage: _______________ ? Controller medicine and dosage: _______________ ? Controller medicine and dosage: _______________  Before exercise, use this reliever or rescue medicine: _______________ Call your health care provider if you are using a reliever or rescue medicine more than 2-3 times a week. Where to find more information You can find  more information about asthma from:  Centers for Disease Control and Prevention: SendThoughts.com.pt  American Lung Association: www.lung.org This information is not intended to replace advice given to you by your health care provider. Make sure you discuss any questions you have with your health care provider. Document Revised: 06/08/2018 Document Reviewed: 05/06/2017 Elsevier Patient Education  2020 [employer]. Advised to follow-up with primary care physician in 1 week. Advised to follow-up with Dr. Meredeth Ide in 2 weeks on January 4. Advised to take prednisone taper is directed.   Advised to take Levaquin for 4 more days to complete 5-day treatment.

## 2020-08-29 NOTE — Progress Notes (Signed)
Date: 08/29/2020,   MRN# [id number] WOODIE DEGRAFFENREID 07-30-1986 Code Status:     Code Status Orders  (From admission, onward)         Start     Ordered   08/27/20 2323  Full code  Continuous        08/27/20 2324        Code Status History    Date Active Date Inactive Code Status Order ID Comments User Context   10/04/2019 1033 10/04/2019 1824 Full Code [id number]  End, Cristal Deer, MD Inpatient   Advance Care Planning Activity    HPI: no wheezing today, no dyspnea, no cough,  No chest pain. Abdominal cramps better, solid stool today after weeks of loose stools, no blood.   See last note  PMHX:   Past Medical History:  Diagnosis Date  . Asthma   . Bipolar disorder (HCC)   . Chest pain    a. 09/2019 Cath: LM nl, LAD nl w/ ? distal myocardial bridge, LCX nl, RCA nl. EF 65%. LVEDP 20, RA 16, PA 37/20(26), PCWP 20. CO/CI 8.0/3.2.  . DOE (dyspnea on exertion)    a. 10/2019 Echo: EF 60-65%, borderline LVH. NO rwma. Nl RV fxn. Triv TR.  Marland Kitchen Hepatic steatosis   . Hypertension   . Morbid obesity (HCC)   . Renal disorder    Surgical Hx:  Past Surgical History:  Procedure Laterality Date  . BACK SURGERY    . RIGHT/LEFT HEART CATH AND CORONARY ANGIOGRAPHY N/A 10/04/2019   Procedure: RIGHT/LEFT HEART CATH AND CORONARY ANGIOGRAPHY;  Surgeon: Yvonne Kendall, MD;  Location: [facility] INVASIVE CV LAB;  Service: Cardiovascular;  Laterality: N/A;  . SHOULDER ARTHROSCOPY  2018   HAD BONE SPURS REMOVED   Family Hx:  Family History  Problem Relation Age of Onset  . Coronary artery disease Mother 25       CABG  . Lung cancer Mother   . Diabetes Father   . Heart attack Paternal Grandmother    Social Hx:   Social History   Tobacco Use  . Smoking status: Never Smoker  . Smokeless tobacco: Never Used  Vaping Use  . Vaping Use: Never used  Substance Use Topics  . Alcohol use: Never  . Drug use: Never   Medication:    Home Medication:    Current Medication: @CURMEDTAB @   Allergies:   Lamotrigine and Tramadol  Review of Systems: Gen:  Denies  fever, sweats, chills HEENT: Denies blurred vision, double vision, ear pain, eye pain, hearing loss, nose bleeds, sore throat Cvc:  No dizziness, chest pain or heaviness Resp:no dyspnea    Gi: Denies swallowing difficulty, stomach pain, nausea or vomiting, diarrhea, constipation, bowel incontinence Gu:  Denies bladder incontinence, burning urine Ext:   No Joint pain, stiffness or swelling Skin: No skin rash, easy bruising or bleeding or hives Endoc:  No polyuria, polydipsia , polyphagia or weight change Psych: No depression, insomnia or hallucinations  Other:  All other systems negative  Physical Examination:   VS: BP (!) 155/73 (BP Location: Right Arm)   Pulse (!) 110   Temp 98.3 F (36.8 C)   Resp 18   Ht 5\' 5"  (1.651 m)   Wt (!) 163.3 kg   SpO2 97%   BMI 59.91 kg/m   General Appearance: No distress sitting eating, obese Neuro: without focal findings, mental status, speech normal, alert and oriented, cranial nerves 2-12 intact, reflexes normal and symmetric, sensation grossly normal  HEENT: PERRLA, EOM intact, no  ptosis, no other lesions noticed, Mallampati: Pulmonary:.No wheezing, No rales   Cardiovascular:  Normal S1,S2.  No m/r/g. normal.    Abdomen:Benign, Soft, non-tender, No masses, hepatosplenomegaly, No lymphadenopathy Endoc: No evident thyromegaly, no signs of acromegaly or Cushing features Skin:   warm, no rashes, no ecchymosis  Extremities: normal, no cyanosis, clubbing, no edema, warm with normal capillary refill. Other findings:   Labs results:   Recent Labs    08/27/20 1822 08/28/20 0608  HGB 14.1 14.4  HCT 41.3 43.0  MCV 81.3 81.9  WBC 11.0* 12.6*  BUN 12 12  CREATININE 0.73 0.69  GLUCOSE 241* 264*  CALCIUM 9.2 9.3  ,  Amylase 45  Assessment and Plan: This is a 34 year male came in with brobchospasm, known hx of mild persistent asthma. No bronchospasm pulm wise can go home  today -resume home regimen -pred pak -follow up with me in 09/11/20  Severe osa, cpap will her -continue cpap on same settings  Obesity, major contributor to his chronic dyspnea -out weight management  Less abdominal pain, stools solid today -ck c.diff pending   Thanks for the consult       I have personally obtained a history, examined the patient, evaluated laboratory and imaging results, formulated the assessment and plan and placed orders.  The Patient requires high complexity decision making for assessment and support, frequent evaluation and titration of therapies, application of advanced monitoring technologies and extensive interpretation of multiple databases.   [place],M.D. Pulmonary & Critical care Medicine [facility]

## 2020-08-29 NOTE — Progress Notes (Addendum)
CSW spoke to patient who is uninsured. Patient reported he is getting insurance effective 1/1 and has a PCP appointment scheduled for 09/11/20 with Dr. Meredeth Ide. Patient is concerned about getting his discharge medications. He is agreeable to using [facility] and reported he has used them in the past. CSW called and talked with [facility], informed him of patient being discharged and needing medications today. Updated pharmacy in Epic, asked MD to send prescriptions to [facility]. Patient reported his wife can pick up these medications and is aware they close at 4:30 pm. Updated RN.  Alfonso Ramus, [place] [phone]

## 2020-09-05 ENCOUNTER — Other Ambulatory Visit: Payer: Self-pay

## 2020-09-05 ENCOUNTER — Ambulatory Visit (INDEPENDENT_AMBULATORY_CARE_PROVIDER_SITE_OTHER): Payer: Self-pay | Admitting: Internal Medicine

## 2020-09-05 ENCOUNTER — Encounter: Payer: Self-pay | Admitting: Internal Medicine

## 2020-09-05 VITALS — BP 130/80 | HR 113 | Ht 65.0 in | Wt 365.0 lb

## 2020-09-05 DIAGNOSIS — R000 Tachycardia, unspecified: Secondary | ICD-10-CM

## 2020-09-05 DIAGNOSIS — R079 Chest pain, unspecified: Secondary | ICD-10-CM

## 2020-09-05 DIAGNOSIS — I10 Essential (primary) hypertension: Secondary | ICD-10-CM

## 2020-09-05 DIAGNOSIS — R0602 Shortness of breath: Secondary | ICD-10-CM

## 2020-09-05 MED ORDER — DILTIAZEM HCL ER COATED BEADS 180 MG PO CP24: 180.0000 mg | ORAL_CAPSULE | Freq: Every day | ORAL | 1 refills | Status: DC

## 2020-09-05 NOTE — Progress Notes (Signed)
Follow-up Outpatient Visit Date: 09/05/2020  Primary Care Provider: Patient, No Pcp Per No address on file  Chief Complaint: Chest pain and shortness of breath  HPI:  Mr. Batterton is a 34 y.o. male with history of asthma, bipolar disorder, chronic chest pain and exertional dyspnea, hypertension, and morbid obesity, who presents for evaluation of tachycardia. He was hospitalized last week with acute on chronic asthma. He also notes several weeks of diarrhea leading up to the hospitalization, though interesting he was constipated during his hospital stay.  Diarrhea has since returned.  Mr. Cancio reports that he still feels very short of breath, using his nebulizer 6-7 times per day.  He also reports frequent chest pain, as well as stomach cramps and pain in his legs and low back.  He had been without his diuretic since June but has been back on this medication for 4 days.  He has also been off diltiazem due to cost issues.  On further questioning, he reports feeling a little better when he was taking it.  --------------------------------------------------------------------------------------------------  Past Medical History:  Diagnosis Date  . Asthma   . Bipolar disorder (HCC)   . Chest pain    a. 09/2019 Cath: LM nl, LAD nl w/ ? distal myocardial bridge, LCX nl, RCA nl. EF 65%. LVEDP 20, RA 16, PA 37/20(26), PCWP 20. CO/CI 8.0/3.2.  . DOE (dyspnea on exertion)    a. 10/2019 Echo: EF 60-65%, borderline LVH. NO rwma. Nl RV fxn. Triv TR.  Marland Kitchen Hepatic steatosis   . Hypertension   . Morbid obesity (HCC)   . Renal disorder    Past Surgical History:  Procedure Laterality Date  . BACK SURGERY    . RIGHT/LEFT HEART CATH AND CORONARY ANGIOGRAPHY N/A 10/04/2019   Procedure: RIGHT/LEFT HEART CATH AND CORONARY ANGIOGRAPHY;  Surgeon: Yvonne Kendall, MD;  Location: [facility] INVASIVE CV LAB;  Service: Cardiovascular;  Laterality: N/A;  . SHOULDER ARTHROSCOPY  2018   HAD BONE SPURS REMOVED     Current Meds  Medication Sig  . diltiazem (CARDIZEM CD) 180 MG 24 hr capsule Take 1 capsule (180 mg total) by mouth daily.  Marland Kitchen etodolac (LODINE) 500 MG tablet Take 500 mg by mouth 2 (two) times daily.  . furosemide (LASIX) 40 MG tablet Take 1 tablet (40 mg total) by mouth daily.  Marland Kitchen gabapentin (NEURONTIN) 300 MG capsule 300mg   in the am, 300mg  at lunch, and 600MG  in the pm.  . ipratropium-albuterol (DUONEB) 0.5-2.5 (3) MG/3ML SOLN Take 3 mLs by nebulization 3 (three) times daily as needed.  lisinopril (ZESTRIL) 10 MG tablet Take 1 tablet (10 mg total) by mouth daily.  . metFORMIN (GLUCOPHAGE-XR) 500 MG 24 hr tablet Take 1 tablet (500 mg total) by mouth in the morning and at bedtime.  . methylPREDNISolone (MEDROL DOSEPAK) 4 MG TBPK tablet Advised to take it as directed.    Allergies: Lamotrigine and Tramadol  Social History   Tobacco Use  . Smoking status: Never Smoker  . Smokeless tobacco: Never Used  Vaping Use  . Vaping Use: Never used  Substance Use Topics  . Alcohol use: Never  . Drug use: Never    Family History  Problem Relation Age of Onset  . Coronary artery disease Mother 66       CABG  . Lung cancer Mother   . Diabetes Father   . Heart attack Paternal Grandmother     Review of Systems: A 12-system review of systems was performed and was negative  except as noted in the HPI.  --------------------------------------------------------------------------------------------------  Physical Exam: BP 130/80 (BP Location: Left Arm, Patient Position: Sitting, Cuff Size: Normal)   Pulse (!) 113   Ht 5\' 5"  (1.651 m)   Wt (!) 365 lb (165.6 kg)   SpO2 97%   BMI 60.74 kg/m   General:  NAD. Neck: No gross JVD or HJR, though body habitus limits evaluation. Lungs: Mildly diminished throughout without wheezes or crackles. Heart: Tachycardic but regular without murmurs, rubs, or gallops. Abd: Obese but soft with diffuse tenderness. Extremities: Trace pretibial edema  bilaterally.  EKG:  Sinus tachycardia (HR 113 bpm).  Otherwise, no abnormality.  Lab Results  Component Value Date   WBC 12.6 (H) 08/28/2020   HGB 14.4 08/28/2020   HCT 43.0 08/28/2020   MCV 81.9 08/28/2020   PLT 332 08/28/2020    Lab Results  Component Value Date   NA 136 08/28/2020   K 4.7 08/28/2020   CL 101 08/28/2020   CO2 21 (L) 08/28/2020   BUN 12 08/28/2020   CREATININE 0.69 08/28/2020   GLUCOSE 264 (H) 08/28/2020   ALT 26 09/26/2019    Lab Results  Component Value Date   CHOL 161 09/26/2019   HDL 52 09/26/2019   LDLCALC 90 09/26/2019   TRIG 106 09/26/2019   CHOLHDL 3.1 09/26/2019    --------------------------------------------------------------------------------------------------  ASSESSMENT AND PLAN: Chest pain and shortness of breath: The symptoms have been longstanding and are likely multifactorial.  Catheterization the summer was notable for no significant CAD and mildly elevated left and right heart filling pressures.  Underlying lung issues, morbid obesity, and marked deconditioning are likely the driving factors behind his dyspnea and chest discomfort, with some contribution from HFpEF.  I think it would be beneficial to place him back on diltiazem.  We have reviewed the pricing of diltiazem extended release through [facility], and this is not cost prohibitive for him.  I think it is reasonable for him to continue with furosemide as well.  Ongoing management of his lung disease will be deferred to his pulmonologist, Dr. 09/28/2019.  Weight loss was again encouraged.  Sinus tachycardia: I suspect this is compensatory in the setting of recent respiratory issues, deconditioning, and morbid obesity.  We will begin diltiazem extended release 180 mg daily.  Weight loss through diet and exercise were encouraged.  Hypertension: BP borderline today.  We will restart diltiazem, as above.  Continue current doses of lisinopril and furosemide.  Morbid obesity: BMI greater  than 60, with weight continuing to climb since 11/2019.  Weight loss encouraged through diet and exercise.  Follow-up:  RTC in 1 month.  12/2019, MD 09/07/2020 12:02 PM

## 2020-09-05 NOTE — Patient Instructions (Signed)
Medication Instructions:   Your physician has recommended you make the following change in your medication:   START Diltiazem ER 180mg  - take 1 tablet daily   *If you need a refill on your cardiac medications before your next appointment, please call your pharmacy*   Lab Work: None ordered    Testing/Procedures: None ordered   Follow-Up: At [facility], you and your health needs are our priority.  As part of our continuing mission to provide you with exceptional heart care, we have created designated Provider Care Teams.  These Care Teams include your primary Cardiologist (physician) and Advanced Practice Providers (APPs -  Physician Assistants and Nurse Practitioners) who all work together to provide you with the care you need, when you need it.  We recommend signing up for the patient portal called "MyChart".  Sign up information is provided on this After Visit Summary.  MyChart is used to connect with patients for Virtual Visits (Telemedicine).  Patients are able to view lab/test results, encounter notes, upcoming appointments, etc.  Non-urgent messages can be sent to your provider as well.   To learn more about what you can do with MyChart, go to [facility].    Your next appointment:   1 month(s)  The format for your next appointment:   In Person  Provider:   You will see one of the following Advanced Practice Providers on your designated Care Team:    [email or web address], NP  Nicolasa Ducking, PA-C  Eula Listen, PA-C  Cadence Dwight, [place]  [place], NP

## 2020-09-07 ENCOUNTER — Encounter: Payer: Self-pay | Admitting: Internal Medicine

## 2020-10-03 NOTE — Progress Notes (Deleted)
Cardiology Office Note    Date:  10/03/2020   ID:  Christian Barrett, DOB 08/25/1986, MRN [id number]  PCP:  Patient, No Pcp Per  Cardiologist:  Yvonne Kendall, MD  Electrophysiologist:  None   Chief Complaint: Follow-up  History of Present Illness:   Christian Barrett is a 35 y.o. male with history of asthma, bipolar disorder, chronic chest pain and exertional dyspnea, HTN, family history of CAD, hepatic steatosis, and morbid obesity who presents for follow-up of ***  He has a long history of constant chest pain that dates back at least 8, possibly more, years along with chronic exertional dyspnea.  He has indicated in the past he was exposed to extensive secondhand smoke growing up indicating his mother smoked a carton of cigarettes per day.  He underwent diagnostic R/LHC in 09/2019 which showed normal coronary arteries with question of a distal LAD myocardial bridge.  Filling pressures were elevated with an LVEDP and wedge of 20.  Cardiac output and index were normal.  Echo in 10/2019 showed an EF of 60 to 65%, borderline LVH, no regional wall motion abnormalities, normal RV systolic function with mildly enlarged ventricular cavity size, and no noted significant valvular abnormalities.  He has been seen in the ED multiple times for chest pain with work-up being unrevealing including high-sensitivity troponins, BNP, and multiple chest CTAs.  More recently, he was admitted in 08/2020 with acute on chronic asthma and followed up with his primary cardiologist on 09/05/2020 for evaluation of sinus tachycardia.  It was noted he had several weeks of diarrhea leading up to his hospitalization.  He reported feeling very short of breath and was using his nebulizer 6-7 times per day.  He continued to report frequent chest pain as well as stomach cramps, and pain in his legs and low back.  He was noted to have been off diltiazem secondary to financial constraints.  He did report feeling better when he was on  this medication.  He also noted having been without his diuretic since 02/2020 had recently resumed this medication when he was seen in 08/2020.  His EKG at that visit showed sinus tachycardia with a rate of 113 bpm and otherwise no significant abnormalities.  It was recommended he resume diltiazem and continue furosemide.  It was also advised that he lose weight through diet and exercise and follow-up with his pulmonologist for further management of his ongoing pulmonary disease.  ***   Labs independently reviewed: 08/2020 - Hgb 14.4, PLT 332, potassium 4.7, BUN 12, serum creatinine 0.69, glucose 264 09/2019 - albumin 4.5, AST/ALT normal, TSH normal, A1c 5.4, TC 161, TG 106, HDL 52, LDL 90  Past Medical History:  Diagnosis Date  . Asthma   . Bipolar disorder (HCC)   . Chest pain    a. 09/2019 Cath: LM nl, LAD nl w/ ? distal myocardial bridge, LCX nl, RCA nl. EF 65%. LVEDP 20, RA 16, PA 37/20(26), PCWP 20. CO/CI 8.0/3.2.  . DOE (dyspnea on exertion)    a. 10/2019 Echo: EF 60-65%, borderline LVH. NO rwma. Nl RV fxn. Triv TR.  Marland Kitchen Hepatic steatosis   . Hypertension   . Morbid obesity (HCC)   . Renal disorder     Past Surgical History:  Procedure Laterality Date  . BACK SURGERY    . RIGHT/LEFT HEART CATH AND CORONARY ANGIOGRAPHY N/A 10/04/2019   Procedure: RIGHT/LEFT HEART CATH AND CORONARY ANGIOGRAPHY;  Surgeon: Yvonne Kendall, MD;  Location: [facility] INVASIVE CV LAB;  Service: Cardiovascular;  Laterality: N/A;  . SHOULDER ARTHROSCOPY  2018   HAD BONE SPURS REMOVED    Current Medications: No outpatient medications have been marked as taking for the 10/08/20 encounter (Appointment) with Sondra Barges, PA-C.    Allergies:   Lamotrigine and Tramadol   Social History   Socioeconomic History  . Marital status: Married    Spouse name: Printmaker  . Number of children: 0  . Years of education: Not on file  . Highest education level: Not on file  Occupational History  . Not on file   Tobacco Use  . Smoking status: Never Smoker  . Smokeless tobacco: Never Used  Vaping Use  . Vaping Use: Never used  Substance and Sexual Activity  . Alcohol use: Never  . Drug use: Never  . Sexual activity: Never  Other Topics Concern  . Not on file  Social History Narrative   LIVES AT HOME WITH WIFE IN PRIVATE RESIDENCE   Social Determinants of Health   Financial Resource Strain: Not on file  Food Insecurity: Not on file  Transportation Needs: Not on file  Physical Activity: Not on file  Stress: Not on file  Social Connections: Not on file     Family History:  The patient's family history includes Coronary artery disease (age of onset: 73) in his mother; Diabetes in his father; Heart attack in his paternal grandmother; Lung cancer in his mother.  ROS:   ROS   EKGs/Labs/Other Studies Reviewed:    Studies reviewed were summarized above. The additional studies were reviewed today:  [facility] 10/04/2019: Conclusions: 1. No angiographically significant coronary artery disease.  Question myocardial bridging of the distal LAD. 2. Mildly elevated left heart and pulmonary artery pressures.  Right heart pressures are moderately to severely increased, most likely related to obesity and untreated sleep apnea.  There is equalization of end-diastolic pressures in all chambers; restrictive process cannot be excluded.  Recommendations: 1. Initiate low-dose diltiazem and work on primary prevention of coronary artery disease. 2. Weight loss and treatment of obstructive sleep apnea recommended. 3. Follow-up in the office in 2 weeks; plan for echocardiogram around that time. __________  2D echo 10/11/2019: 1. Left ventricular ejection fraction, by visual estimation, is 60 to  65%. The left ventricle has normal function. There is borderline left  ventricular hypertrophy.  2. Definity contrast agent was given IV to delineate the left ventricular  endocardial borders.  3. The left  ventricle has no regional wall motion abnormalities.  4. Global right ventricle has normal systolic function.The right  ventricular size is mildly enlarged. Right vetricular wall thickness was  not assessed.  5. Left atrial size was normal.  6. Right atrial size was normal.  7. The mitral valve is grossly normal. No evidence of mitral valve  regurgitation. No evidence of mitral stenosis.  8. The tricuspid valve is not well visualized.  9. The tricuspid valve is not well visualized. Tricuspid valve  regurgitation is trivial.  10. The aortic valve was not well visualized. Aortic valve regurgitation  is not visualized. No evidence of aortic valve sclerosis or stenosis.  11. The pulmonic valve was not well visualized. Pulmonic valve  regurgitation is not visualized.  12. The interatrial septum was not well visualized.   EKG:  EKG is ordered today.  The EKG ordered today demonstrates ***  Recent Labs: 04/23/2020: B Natriuretic Peptide 33.1 08/28/2020: BUN 12; Creatinine, Ser 0.69; Hemoglobin 14.4; Platelets 332; Potassium 4.7; Sodium 136  Recent  Lipid Panel    Component Value Date/Time   CHOL 161 09/26/2019 0840   TRIG 106 09/26/2019 0840   HDL 52 09/26/2019 0840   CHOLHDL 3.1 09/26/2019 0840   LDLCALC 90 09/26/2019 0840    PHYSICAL EXAM:    VS:  There were no vitals taken for this visit.  BMI: There is no height or weight on file to calculate BMI.  Physical Exam  Wt Readings from Last 3 Encounters:  09/05/20 (!) 365 lb (165.6 kg)  08/27/20 (!) 360 lb (163.3 kg)  04/06/20 (!) 355 lb (161 kg)     ASSESSMENT & PLAN:   1. Chest pain/shortness of breath:  2. Sinus tachycardia:  3. HTN: Blood pressure ***  4. Morbid obesity:  Disposition: F/u with Dr. Okey Dupre or an APP in ***.   Medication Adjustments/Labs and Tests Ordered: Current medicines are reviewed at length with the patient today.  Concerns regarding medicines are outlined above. Medication changes, Labs and  Tests ordered today are summarized above and listed in the Patient Instructions accessible in Encounters.   Signed, Eula Listen, PA-C 10/03/2020 9:44 AM     [facility] [address] [place], [place] [address] [phone]

## 2020-10-08 ENCOUNTER — Ambulatory Visit: Payer: Self-pay | Admitting: Physician Assistant

## 2020-10-09 ENCOUNTER — Encounter: Payer: Self-pay | Admitting: Physician Assistant

## 2020-10-16 ENCOUNTER — Ambulatory Visit (INDEPENDENT_AMBULATORY_CARE_PROVIDER_SITE_OTHER): Payer: 59 | Admitting: Internal Medicine

## 2020-10-16 ENCOUNTER — Other Ambulatory Visit: Payer: Self-pay

## 2020-10-16 ENCOUNTER — Encounter: Payer: Self-pay | Admitting: Internal Medicine

## 2020-10-16 VITALS — BP 140/98 | HR 94 | Temp 98.2°F | Resp 16 | Ht 65.0 in | Wt 368.0 lb

## 2020-10-16 DIAGNOSIS — E119 Type 2 diabetes mellitus without complications: Secondary | ICD-10-CM | POA: Diagnosis not present

## 2020-10-16 DIAGNOSIS — H1010 Acute atopic conjunctivitis, unspecified eye: Secondary | ICD-10-CM

## 2020-10-16 DIAGNOSIS — J309 Allergic rhinitis, unspecified: Secondary | ICD-10-CM

## 2020-10-16 DIAGNOSIS — I10 Essential (primary) hypertension: Secondary | ICD-10-CM | POA: Diagnosis not present

## 2020-10-16 DIAGNOSIS — Z9989 Dependence on other enabling machines and devices: Secondary | ICD-10-CM

## 2020-10-16 DIAGNOSIS — J4551 Severe persistent asthma with (acute) exacerbation: Secondary | ICD-10-CM

## 2020-10-16 DIAGNOSIS — J0101 Acute recurrent maxillary sinusitis: Secondary | ICD-10-CM

## 2020-10-16 DIAGNOSIS — G4733 Obstructive sleep apnea (adult) (pediatric): Secondary | ICD-10-CM

## 2020-10-16 LAB — POCT GLYCOSYLATED HEMOGLOBIN (HGB A1C): Hemoglobin A1C: 6.9 % — AB (ref 4.0–5.6)

## 2020-10-16 MED ORDER — FLUTICASONE PROPIONATE 50 MCG/ACT NA SUSP: 2.0000 | Freq: Two times a day (BID) | NASAL | 6 refills | Status: DC

## 2020-10-16 MED ORDER — DILTIAZEM HCL ER 240 MG PO CP24: 240.0000 mg | ORAL_CAPSULE | Freq: Every day | ORAL | 3 refills | Status: DC

## 2020-10-16 MED ORDER — MONTELUKAST SODIUM 10 MG PO TABS: 10.0000 mg | ORAL_TABLET | Freq: Every day | ORAL | 3 refills | Status: DC

## 2020-10-16 MED ORDER — LISINOPRIL 20 MG PO TABS: ORAL_TABLET | ORAL | 3 refills | Status: DC

## 2020-10-16 MED ORDER — AMOXICILLIN-POT CLAVULANATE 875-125 MG PO TABS: 1.0000 | ORAL_TABLET | Freq: Two times a day (BID) | ORAL | 0 refills | Status: DC

## 2020-10-16 NOTE — Progress Notes (Signed)
[facility] [address] [place], [place] [address]  Internal MEDICINE  Office Visit Note  Patient Name: Christian Barrett  [id number]  [id number]  Date of Service: 10/16/2020   Complaints/HPI  Chief Complaint  Patient presents with  . New Patient (Initial Visit)    Establish care  . Epistaxis    Keeps waking up with nose bleed and when he blows his nose blood comes out.  Started a week and half ago. Not bleeding out, he just has blood inside of nose and can taste it.   HPI Pt is here for establishment of PCP. Pt has multiple medical problems and has frequent visits to ED and frequent hospitalizations 1. Recent diagnosis of diabetes, has been on metformin with elevated hg a1c  2. Uncontrolled BP and heart rate both, recent echo in 2021 showed normal EF, diastolic dysfunction and LVH, takes  3. OSA on CPAP, has been having nasal congestion and bloody discharge, he is not sure about humidifier with cpap 4. Uncontrolled Asthma, does not if allergy induced. Has been on prednisone, ?? Hypoventilation obesity syndrome 5. Severe nasal congestion as well and PND 6. Recent cardiac cath and CT angio is reviewed  7. H/O bipolar disorder not on any meds at the moment    Current Medication: Outpatient Encounter Medications as of 10/16/2020  Medication Sig  . amoxicillin-clavulanate (AUGMENTIN) 875-125 MG tablet Take 1 tablet by mouth 2 (two) times daily. For sinus congestion  . diltiazem (DILACOR XR) 240 MG 24 hr capsule Take 1 capsule (240 mg total) by mouth daily.  Marland Kitchen etodolac (LODINE) 500 MG tablet Take 500 mg by mouth 2 (two) times daily.  . fluticasone (FLONASE) 50 MCG/ACT nasal spray Place 2 sprays into both nostrils in the morning and at bedtime.  . gabapentin (NEURONTIN) 300 MG capsule 300mg   in the am, 300mg  at lunch, and 600MG  in the pm.  . ipratropium-albuterol (DUONEB) 0.5-2.5 (3) MG/3ML SOLN Take 3 mLs by nebulization 3 (three) times daily as needed.  . montelukast  (SINGULAIR) 10 MG tablet Take 1 tablet (10 mg total) by mouth at bedtime. For nasal congestion // asthma  . [DISCONTINUED] diltiazem (CARDIZEM CD) 180 MG 24 hr capsule Take 1 capsule (180 mg total) by mouth daily.  . [DISCONTINUED] metFORMIN (GLUCOPHAGE-XR) 500 MG 24 hr tablet Take 1 tablet (500 mg total) by mouth in the morning and at bedtime.  . furosemide (LASIX) 40 MG tablet Take 1 tablet (40 mg total) by mouth daily.  lisinopril (ZESTRIL) 20 MG tablet Take one tab a day for bp  . [DISCONTINUED] lisinopril (ZESTRIL) 10 MG tablet Take 1 tablet (10 mg total) by mouth daily.  . [DISCONTINUED] methylPREDNISolone (MEDROL DOSEPAK) 4 MG TBPK tablet Advised to take it as directed. (Patient not taking: Reported on 10/16/2020)   No facility-administered encounter medications on file as of 10/16/2020.    Surgical History: Past Surgical History:  Procedure Laterality Date  . BACK SURGERY    . RIGHT/LEFT HEART CATH AND CORONARY ANGIOGRAPHY N/A 10/04/2019   Procedure: RIGHT/LEFT HEART CATH AND CORONARY ANGIOGRAPHY;  Surgeon: 12/14/2020, MD;  Location: [facility] INVASIVE CV LAB;  Service: Cardiovascular;  Laterality: N/A;  . SHOULDER ARTHROSCOPY  2018   HAD BONE SPURS REMOVED    Medical History: Past Medical History:  Diagnosis Date  . Asthma   . Bipolar disorder (HCC)   . Chest pain    a. 09/2019 Cath: LM nl, LAD nl w/ ? distal myocardial bridge, LCX nl,  RCA nl. EF 65%. LVEDP 20, RA 16, PA 37/20(26), PCWP 20. CO/CI 8.0/3.2.  . DOE (dyspnea on exertion)    a. 10/2019 Echo: EF 60-65%, borderline LVH. NO rwma. Nl RV fxn. Triv TR.  Marland Kitchen Hepatic steatosis   . Hypertension   . Morbid obesity (HCC)   . Renal disorder     Family History: Family History  Problem Relation Age of Onset  . Coronary artery disease Mother 60       CABG  . Lung cancer Mother   . Diabetes Father   . Heart attack Paternal Grandmother     Social History   Socioeconomic History  . Marital status: Married    Spouse  name: Printmaker  . Number of children: 0  . Years of education: Not on file  . Highest education level: Not on file  Occupational History  . Not on file  Tobacco Use  . Smoking status: Never Smoker  . Smokeless tobacco: Never Used  Vaping Use  . Vaping Use: Never used  Substance and Sexual Activity  . Alcohol use: Never  . Drug use: Never  . Sexual activity: Never  Other Topics Concern  . Not on file  Social History Narrative   LIVES AT HOME WITH WIFE IN PRIVATE RESIDENCE   Social Determinants of Health   Financial Resource Strain: Not on file  Food Insecurity: Not on file  Transportation Needs: Not on file  Physical Activity: Not on file  Stress: Not on file  Social Connections: Not on file  Intimate Partner Violence: Not on file     Review of Systems  Constitutional: Positive for fever. Negative for chills, fatigue and unexpected weight change.  HENT: Positive for postnasal drip, sinus pressure and sinus pain. Negative for congestion, rhinorrhea, sneezing and sore throat.   Eyes: Negative for redness.  Respiratory: Positive for cough, shortness of breath and wheezing. Negative for chest tightness.   Cardiovascular: Negative for chest pain and palpitations.       Elevated blood pressure   Gastrointestinal: Negative for abdominal pain, constipation, diarrhea, nausea and vomiting.  Genitourinary: Negative for dysuria and frequency.  Musculoskeletal: Negative for arthralgias, back pain, joint swelling and neck pain.  Skin: Negative for rash.  Neurological: Negative.  Negative for tremors and numbness.  Hematological: Negative for adenopathy. Does not bruise/bleed easily.  Psychiatric/Behavioral: Negative for behavioral problems (Depression), sleep disturbance and suicidal ideas. The patient is not nervous/anxious.     Vital Signs: BP (!) 140/98   Pulse 94   Temp 98.2 F (36.8 C)   Resp 16   Ht 5\' 5"  (1.651 m)   Wt (!) 368 lb (166.9 kg)   SpO2 97%   BMI 61.24  kg/m    Physical Exam Constitutional:      Appearance: He is obese.  HENT:     Head: Normocephalic and atraumatic.     Left Ear: Tympanic membrane normal.     Nose: Congestion present.     Comments: Turbinates are swollen and boggy     Mouth/Throat:     Mouth: Mucous membranes are moist.  Eyes:     Extraocular Movements: Extraocular movements intact.     Conjunctiva/sclera: Conjunctivae normal.     Pupils: Pupils are equal, round, and reactive to light.  Cardiovascular:     Rate and Rhythm: Regular rhythm. Tachycardia present.     Pulses: Normal pulses.     Heart sounds: Normal heart sounds.  Pulmonary:     Breath  sounds: Wheezing present.     Comments: Decreased air entry  Musculoskeletal:     Cervical back: Normal range of motion.  Skin:    General: Skin is warm and dry.  Neurological:     General: No focal deficit present.     Mental Status: He is alert and oriented to person, place, and time.  Psychiatric:        Mood and Affect: Mood normal.        Behavior: Behavior normal.       Assessment/Plan: 1. Type 2 diabetes mellitus without complication, without long-term current use of insulin (HCC) Stop metformin, start Trulicity once a week, 0.5 mg qweek, instructions provided  - POCT glycosylated hemoglobin (Hb A1C)  2. Uncontrolled hypertension Increase Both diltiazem and Lisinopril to 240 mg and 20 mg respectively  - diltiazem (DILACOR XR) 240 MG 24 hr capsule; Take 1 capsule (240 mg total) by mouth daily.  Dispense: 30 capsule; Refill: 3 - lisinopril (ZESTRIL) 20 MG tablet; Take one tab a day for bp  Dispense: 30 tablet; Refill: 3  3. Allergic rhinoconjunctivitis Avoid allergens like smoking and strong perfumes  - fluticasone (FLONASE) 50 MCG/ACT nasal spray; Place 2 sprays into both nostrils in the morning and at bedtime.  Dispense: 16 g; Refill: 6  4. Severe persistent asthma with exacerbation Samples of Anoro is given, pt might need drug assistance  -  montelukast (SINGULAIR) 10 MG tablet; Take 1 tablet (10 mg total) by mouth at bedtime. For nasal congestion // asthma  Dispense: 30 tablet; Refill: 3  5. OSA on CPAP Pt will need to be seen in sleep clinic, add Flonase, might need humidifier, will need overnight oximetry as well   6. Acute recurrent maxillary sinusitis - amoxicillin-clavulanate (AUGMENTIN) 875-125 MG tablet; Take 1 tablet by mouth 2 (two) times daily. For sinus congestion  Dispense: 20 tablet; Refill: 0 Might need CT sinus, other wrk up for granulomatous disease  General Counseling: zayan delvecchio understanding of the findings of todays visit and agrees with plan of treatment. I have discussed any further diagnostic evaluation that may be needed or ordered today. We also reviewed his medications today. he has been encouraged to call the office with any questions or concerns that should arise related to todays visit.  [place] Controlled Substance Database was reviewed by me.  Orders Placed This Encounter  Procedures  . POCT glycosylated hemoglobin (Hb A1C)    Meds ordered this encounter  Medications  . diltiazem (DILACOR XR) 240 MG 24 hr capsule    Sig: Take 1 capsule (240 mg total) by mouth daily.    Dispense:  30 capsule    Refill:  3  . lisinopril (ZESTRIL) 20 MG tablet    Sig: Take one tab a day for bp    Dispense:  30 tablet    Refill:  3  . fluticasone (FLONASE) 50 MCG/ACT nasal spray    Sig: Place 2 sprays into both nostrils in the morning and at bedtime.    Dispense:  16 g    Refill:  6  . montelukast (SINGULAIR) 10 MG tablet    Sig: Take 1 tablet (10 mg total) by mouth at bedtime. For nasal congestion // asthma    Dispense:  30 tablet    Refill:  3  . amoxicillin-clavulanate (AUGMENTIN) 875-125 MG tablet    Sig: Take 1 tablet by mouth 2 (two) times daily. For sinus congestion    Dispense:  20 tablet  Refill:  0    Time spent:45 Minutes

## 2020-10-16 NOTE — Progress Notes (Signed)
Per Dr Welton Flakes patient was given samples of Anora (3) and Trulicity (2) and I gave instructions to patient on how to use both medications.  After showing pt how to use Trulicity, I had patient self administer the first weekly dose of Trulicity in lower abdomen.  Pt agreed to understanding how to administer the medication.

## 2020-10-17 ENCOUNTER — Ambulatory Visit (INDEPENDENT_AMBULATORY_CARE_PROVIDER_SITE_OTHER): Payer: 59

## 2020-10-17 DIAGNOSIS — G4733 Obstructive sleep apnea (adult) (pediatric): Secondary | ICD-10-CM

## 2020-10-17 NOTE — Progress Notes (Signed)
95 percentile pressure 10   95th percentile leak 30    apnea-hypopnea index  1.0 /hr   total days used  >4 hr 29 days  total days used <4 hr 1 days  Total compliance 99 percent  Went over humidifier and per Dr Welton Flakes needs ono

## 2020-10-19 ENCOUNTER — Telehealth: Payer: Self-pay

## 2020-10-19 NOTE — Telephone Encounter (Signed)
Gave [facility] orders for overnight ox test on cpap./ Christian Barrett

## 2020-10-21 ENCOUNTER — Encounter: Payer: Self-pay | Admitting: Internal Medicine

## 2020-11-06 ENCOUNTER — Ambulatory Visit: Payer: Self-pay | Admitting: Internal Medicine

## 2020-11-21 ENCOUNTER — Other Ambulatory Visit: Payer: Self-pay

## 2020-11-21 DIAGNOSIS — T753XXA Motion sickness, initial encounter: Secondary | ICD-10-CM

## 2020-11-21 MED ORDER — MECLIZINE HCL 12.5 MG PO TABS: 12.5000 mg | ORAL_TABLET | Freq: Three times a day (TID) | ORAL | 0 refills | Status: DC | PRN

## 2020-11-21 NOTE — Telephone Encounter (Signed)
Pt called advising that he just came back from a cruise on Monday and is having sea sickness, per Ladona Ridgel sent in meclizine 12.5 mg three times  Day.

## 2020-11-23 ENCOUNTER — Encounter: Payer: Self-pay | Admitting: Hospice and Palliative Medicine

## 2020-11-23 ENCOUNTER — Ambulatory Visit (INDEPENDENT_AMBULATORY_CARE_PROVIDER_SITE_OTHER): Payer: 59 | Admitting: Hospice and Palliative Medicine

## 2020-11-23 ENCOUNTER — Other Ambulatory Visit: Payer: Self-pay

## 2020-11-23 VITALS — BP 132/86 | HR 105 | Temp 97.1°F | Resp 16 | Ht 65.0 in | Wt 374.2 lb

## 2020-11-23 DIAGNOSIS — R112 Nausea with vomiting, unspecified: Secondary | ICD-10-CM

## 2020-11-23 DIAGNOSIS — T753XXA Motion sickness, initial encounter: Secondary | ICD-10-CM

## 2020-11-23 MED ORDER — SCOPOLAMINE 1 MG/3DAYS TD PT72
1.0000 | MEDICATED_PATCH | TRANSDERMAL | 0 refills | Status: DC
Start: 2020-11-23 — End: 2020-12-26

## 2020-11-23 NOTE — Progress Notes (Signed)
[facility] [address] [place], [place] [address]  Internal MEDICINE  Office Visit Note  Patient Name: Christian Barrett  [id number]  [id number]  Date of Service: 11/26/2020  Chief Complaint  Patient presents with  . 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 Quality Metric Gaps    Foot exam, eye exam, pneumovax     HPI Pt is here for a sick visit. Traveling on cruise ship last week, arrived back home Monday-3/14, felt find during his cruise, since being home he has felt sea sick Feels as though he is rocking back and forth which is causing N/V Symptoms of dizziness worsened when he closes his eyes and lies still making sleep difficult  Has been taking OTC dramamine which is not resolving his symptoms and causing drowsiness   Current Medication:  Outpatient Encounter Medications as of 11/23/2020  Medication Sig  . scopolamine (TRANSDERM-SCOP) 1 MG/3DAYS Place 1 patch (1.5 mg total) onto the skin every 3 (three) days.  Marland Kitchen diltiazem (DILACOR XR) 240 MG 24 hr capsule Take 1 capsule (240 mg total) by mouth daily.  Marland Kitchen etodolac (LODINE) 500 MG tablet Take 500 mg by mouth 2 (two) times daily.  . fluticasone (FLONASE) 50 MCG/ACT nasal spray Place 2 sprays into both nostrils in the morning and at bedtime.  . furosemide (LASIX) 40 MG tablet Take 1 tablet (40 mg total) by mouth daily.  Marland Kitchen gabapentin (NEURONTIN) 300 MG capsule 300mg   in the am, 300mg  at lunch, and 600MG  in the pm.  . ipratropium-albuterol (DUONEB) 0.5-2.5 (3) MG/3ML SOLN Take 3 mLs by nebulization 3 (three) times daily as needed.  lisinopril (ZESTRIL) 20 MG tablet Take one tab a day for bp  . meclizine (ANTIVERT) 12.5 MG tablet Take 1 tablet (12.5 mg total) by mouth  3 (three) times daily as needed for dizziness.  . montelukast (SINGULAIR) 10 MG tablet Take 1 tablet (10 mg total) by mouth at bedtime. For nasal congestion // asthma  . [DISCONTINUED] amoxicillin-clavulanate (AUGMENTIN) 875-125 MG tablet Take 1 tablet by mouth 2 (two) times daily. For sinus congestion (Patient not taking: Reported on 11/23/2020)   No facility-administered encounter medications on file as of 11/23/2020.      Medical History: Past Medical History:  Diagnosis Date  . Asthma   . Bipolar disorder (HCC)   . Chest pain    a. 09/2019 Cath: LM nl, LAD nl w/ ? distal myocardial bridge, LCX nl, RCA nl. EF 65%. LVEDP 20, RA 16, PA 37/20(26), PCWP 20. CO/CI 8.0/3.2.  . DOE (dyspnea on exertion)    a. 10/2019 Echo: EF 60-65%, borderline LVH. NO rwma. Nl RV fxn. Triv TR.  11/25/2020 Hepatic steatosis   . Hypertension   . Morbid obesity (HCC)   . Renal disorder      Vital Signs: BP 132/86   Pulse (!) 105   Temp (!) 97.1 F (36.2 C)   Resp 16   Ht 5\' 5"  (1.651 m)   Wt (!) 374 lb 3.2 oz (169.7 kg)   SpO2 98%   BMI 62.27 kg/m    Review of Systems  Constitutional: Negative for chills, fatigue and unexpected weight change.  HENT: Negative for congestion, postnasal  drip, rhinorrhea, sneezing and sore throat.   Eyes: Negative for redness.  Respiratory: Negative for cough, chest tightness and shortness of breath.   Cardiovascular: Negative for chest pain and palpitations.  Gastrointestinal: Positive for nausea and vomiting. Negative for abdominal pain, constipation and diarrhea.  Genitourinary: Negative for dysuria and frequency.  Musculoskeletal: Negative for arthralgias, back pain, joint swelling and neck pain.  Skin: Negative for rash.  Neurological: Positive for dizziness. Negative for tremors and numbness.  Hematological: Negative for adenopathy. Does not bruise/bleed easily.  Psychiatric/Behavioral: Negative for behavioral problems (Depression), sleep disturbance and suicidal  ideas. The patient is not nervous/anxious.     Physical Exam Vitals reviewed.  Constitutional:      Appearance: Normal appearance. He is obese.  Cardiovascular:     Rate and Rhythm: Normal rate and regular rhythm.     Pulses: Normal pulses.     Heart sounds: Normal heart sounds.  Pulmonary:     Effort: Pulmonary effort is normal.     Breath sounds: Normal breath sounds.  Abdominal:     General: Abdomen is flat.     Palpations: Abdomen is soft.  Musculoskeletal:        General: Normal range of motion.     Cervical back: Normal range of motion.  Skin:    General: Skin is warm.  Neurological:     General: No focal deficit present.     Mental Status: He is alert and oriented to person, place, and time. Mental status is at baseline.  Psychiatric:        Mood and Affect: Mood normal.        Behavior: Behavior normal.        Thought Content: Thought content normal.        Judgment: Judgment normal.   Assessment/Plan: 1. Severe motion sickness, initial encounter Dry land seasickness after cruise Advised to start scopolamine patches and to contact office Monday if symptoms have worsened or not showing improvement  - scopolamine (TRANSDERM-SCOP) 1 MG/3DAYS; Place 1 patch (1.5 mg total) onto the skin every 3 (three) days.  Dispense: 10 patch; Refill: 0  2. Non-intractable vomiting with nausea, unspecified vomiting type Likely related to seasickness, advised to stay hydrated, will send for additional testing if symptoms have not improved by Monday  General Counseling: Christian Barrett understanding of the findings of todays visit and agrees with plan of treatment. I have discussed any further diagnostic evaluation that may be needed or ordered today. We also reviewed his medications today. he has been encouraged to call the office with any questions or concerns that should arise related to todays visit.   Meds ordered this encounter  Medications  . scopolamine (TRANSDERM-SCOP) 1  MG/3DAYS    Sig: Place 1 patch (1.5 mg total) onto the skin every 3 (three) days.    Dispense:  10 patch    Refill:  0    Time spent: 25 Minutes Time spent includes review of chart, medications, test results and follow-up plan with the patient.  This patient was seen by Leeanne Deed AGNP-C in Collaboration with Dr Lyndon Code as a part of collaborative care agreement.  Lubertha Basque [facility] Internal Medicine

## 2020-11-25 ENCOUNTER — Encounter: Payer: Self-pay | Admitting: Hospice and Palliative Medicine

## 2020-11-25 NOTE — Telephone Encounter (Signed)
Pt will come in at 9 30

## 2020-11-26 ENCOUNTER — Telehealth: Payer: Self-pay

## 2020-11-26 ENCOUNTER — Encounter: Payer: Self-pay | Admitting: Hospice and Palliative Medicine

## 2020-11-26 ENCOUNTER — Ambulatory Visit: Payer: 59 | Admitting: Physician Assistant

## 2020-11-26 NOTE — Telephone Encounter (Signed)
Lmom for patient to contact office to schedule an appointment. Toni Amend

## 2020-11-27 ENCOUNTER — Telehealth: Payer: Self-pay

## 2020-11-27 MED ORDER — FUROSEMIDE 40 MG PO TABS: 40.0000 mg | ORAL_TABLET | Freq: Every day | ORAL | 0 refills | Status: DC

## 2020-11-27 NOTE — Telephone Encounter (Signed)
Call transferred from lab tech. Patient got home from a cruise about a week ago and was diagnosed with "land sickness" at that time. However, reports sweatiness occurring for several months now. He also has his HR running 120-135's. I asked if he was still taking his diltiazem and he ran out of it and said he was waiting approval for refill for more. Patient was supposed to follow up 1 month from last visit in December and has not at this time. Went ahead and scheduled him for appointment tomorrow with Alycia Rossetti. Denies any new chest pain stating he's been having chest pain since he was 35 years old. Denies lightheadedness or dizziness.  Patient aware to arrive about 20 minutes prior to appointment tomorrow. Pt verbalized understanding to call 911 or go to the emergency room, if he develops any new or worsening symptoms.

## 2020-11-27 NOTE — Telephone Encounter (Signed)
Patient was on a cruise for 7 days and since he returned, he has felt some rapid heart beats with his heart rate running 120-135 bpm  with feeling sweaty all day long. Please contact patient with instructions on what to do.

## 2020-11-28 ENCOUNTER — Ambulatory Visit (INDEPENDENT_AMBULATORY_CARE_PROVIDER_SITE_OTHER): Payer: 59 | Admitting: Physician Assistant

## 2020-11-28 ENCOUNTER — Other Ambulatory Visit: Payer: Self-pay

## 2020-11-28 ENCOUNTER — Ambulatory Visit (INDEPENDENT_AMBULATORY_CARE_PROVIDER_SITE_OTHER): Payer: 59

## 2020-11-28 ENCOUNTER — Other Ambulatory Visit
Admission: RE | Admit: 2020-11-28 | Discharge: 2020-11-28 | Disposition: A | Discharge status: Home / Self Care | Payer: 59 | Source: Ambulatory Visit | Attending: Physician Assistant | Admitting: Physician Assistant

## 2020-11-28 ENCOUNTER — Encounter: Payer: Self-pay | Admitting: Physician Assistant

## 2020-11-28 VITALS — BP 114/86 | HR 92 | Ht 65.0 in | Wt 372.0 lb

## 2020-11-28 DIAGNOSIS — R079 Chest pain, unspecified: Secondary | ICD-10-CM | POA: Diagnosis not present

## 2020-11-28 DIAGNOSIS — R002 Palpitations: Secondary | ICD-10-CM

## 2020-11-28 DIAGNOSIS — R0602 Shortness of breath: Secondary | ICD-10-CM

## 2020-11-28 DIAGNOSIS — G4733 Obstructive sleep apnea (adult) (pediatric): Secondary | ICD-10-CM

## 2020-11-28 DIAGNOSIS — J45909 Unspecified asthma, uncomplicated: Secondary | ICD-10-CM

## 2020-11-28 DIAGNOSIS — I10 Essential (primary) hypertension: Secondary | ICD-10-CM | POA: Diagnosis not present

## 2020-11-28 DIAGNOSIS — G8929 Other chronic pain: Secondary | ICD-10-CM

## 2020-11-28 LAB — CBC
HCT: 39.5 % (ref 39.0–52.0)
Hemoglobin: 13.2 g/dL (ref 13.0–17.0)
MCH: 27.2 pg (ref 26.0–34.0)
MCHC: 33.4 g/dL (ref 30.0–36.0)
MCV: 81.4 fL (ref 80.0–100.0)
Platelets: 323 10*3/uL (ref 150–400)
RBC: 4.85 MIL/uL (ref 4.22–5.81)
RDW: 13 % (ref 11.5–15.5)
WBC: 11.1 10*3/uL — ABNORMAL HIGH (ref 4.0–10.5)
nRBC: 0 % (ref 0.0–0.2)

## 2020-11-28 LAB — BASIC METABOLIC PANEL
Anion gap: 8 (ref 5–15)
BUN: 18 mg/dL (ref 6–20)
CO2: 25 mmol/L (ref 22–32)
Calcium: 8.8 mg/dL — ABNORMAL LOW (ref 8.9–10.3)
Chloride: 104 mmol/L (ref 98–111)
Creatinine, Ser: 0.6 mg/dL — ABNORMAL LOW (ref 0.61–1.24)
GFR, Estimated: >60 mL/min (ref >60)
Glucose, Bld: 162 mg/dL — ABNORMAL HIGH (ref 70–99)
Potassium: 3.9 mmol/L (ref 3.5–5.1)
Sodium: 137 mmol/L (ref 135–145)

## 2020-11-28 LAB — MAGNESIUM: Magnesium: 2.1 mg/dL (ref 1.7–2.4)

## 2020-11-28 LAB — TSH: TSH: 1.65 u[IU]/mL (ref 0.350–4.500)

## 2020-11-28 MED ORDER — DILTIAZEM HCL ER 240 MG PO CP24: 240.0000 mg | ORAL_CAPSULE | Freq: Every day | ORAL | 3 refills | Status: DC

## 2020-11-28 NOTE — Progress Notes (Signed)
Cardiology Office Note    Date:  11/28/2020   ID:  TOA MIA, DOB [birth year], MRN [id number]  PCP:  Lyndon Code, MD  Cardiologist:  Yvonne Kendall, MD  Electrophysiologist:  None   Chief Complaint: Follow up  History of Present Illness:   Christian Barrett is a 35 y.o. male with history of DM2, HTN, bipolar disorder, asthma, morbid obesity, OSA, hepatic steatosis, chronic chest pain, and exertional dyspnea who presents for follow up of chronic chest pain and SOB.   He established with our office in 09/2019, at which time he reported many years of chronic chest pain and SOB with activity. He underwent R/LHC on 10/04/2019, that showed no angiographically significant CAD with question of myocardial bridge of the distal LAD. There was mildly elevated left heart and PA pressures. Right heart pressures were moderately to severely elevated, most likely related to obesity and untreated sleep apnea. There was equalization of end-diastolic pressure in all chambers, and restrictive process could not be excluded. In this setting, he was started on diltiazem. Echo in 10/2019 showed an EF of 60-65%, borderline LVH, no RWMA, normal RVSF with mildly enlarged RV cavity size, and trivial tricuspid regurgitation. Since undergoing the above cardiac workup, he has been seen in the ED with asthma in 04/2020 and admitted with asthma and diarrhea in 08/2020. High sensitivity troponin and BNP have been normal throughout.   He was last seen in 08/2020, continuing to note significant SOB and was using his nebulizer 6-7 times per day. He continued to report frequent chest pain, stomach cramps, as well as leg and back pain. He reported being off his diuretic for 6 months, though had recently resumed it. He also reported being off diltiazem, and noted improvement in symptoms when he was taking this medication.   He contact our office on 3/22 noting he had recently been on a cruise and following this he was experiencing  heart rates in the 120s-130s bpm as well as chronic chest pain. He had been out of his diltiazem. In this setting, appointment was scheduled for today.   He comes in today noting ongoing heart rates in the 1 teens to 130s bpm as well as exertional fatigue and sweatiness.  The symptoms have been ongoing for quite some time.  The symptoms limit his ability to do household chores at times.  He has been out of diltiazem for approximately 2 to 3 weeks, though even when he did have this medicine he noted his heart rate to be in the 1 teens bpm.  He reports frequent use of his rescue inhaler and nebulizer up to multiple times per day.  He is not currently on any scheduled medication for underlying asthma.  He continues to note chest pain that has been present for approximately 20 years.  He does report compliance with his CPAP.  He recently went on a [place] cruise, following his return to the [place] indicates he was diagnosed with "land sickness."  With this he has noted dizziness and nausea for which she has continued to use a scopolamine patch.  When his patch fell off earlier his symptoms were exacerbated.   Labs independently reviewed: 10/2020 - A1c 6.9 08/2020 - HGB 14.4, PLT 332, potassium 4.7, BUN 12, SCr 0.69 09/2019 - albumin 4.5, AST/ALT normal, TSH normal, TC 161, TG 106, HDL 52, LDL 90  Past Medical History:  Diagnosis Date  . Asthma   . Bipolar disorder (HCC)   . Chest pain  a. 09/2019 Cath: LM nl, LAD nl w/ ? distal myocardial bridge, LCX nl, RCA nl. EF 65%. LVEDP 20, RA 16, PA 37/20(26), PCWP 20. CO/CI 8.0/3.2.  . DOE (dyspnea on exertion)    a. 10/2019 Echo: EF 60-65%, borderline LVH. NO rwma. Nl RV fxn. Triv TR.  Marland Kitchen. Hepatic steatosis   . Hypertension   . Morbid obesity (HCC)   . Renal disorder     Past Surgical History:  Procedure Laterality Date  . BACK SURGERY    . CARDIAC CATHETERIZATION    . RIGHT/LEFT HEART CATH AND CORONARY ANGIOGRAPHY N/A 10/04/2019   Procedure: RIGHT/LEFT  HEART CATH AND CORONARY ANGIOGRAPHY;  Surgeon: Yvonne KendallEnd, Christopher, MD;  Location: [facility] INVASIVE CV LAB;  Service: Cardiovascular;  Laterality: N/A;  . SHOULDER ARTHROSCOPY  2018   HAD BONE SPURS REMOVED    Current Medications: Current Meds  Medication Sig  . etodolac (LODINE) 500 MG tablet Take 500 mg by mouth 2 (two) times daily.  . furosemide (LASIX) 40 MG tablet Take 1 tablet (40 mg total) by mouth daily.  Marland Kitchen. gabapentin (NEURONTIN) 300 MG capsule 300mg   in the am, 300mg  at lunch, and 600MG  in the pm.  . ipratropium-albuterol (DUONEB) 0.5-2.5 (3) MG/3ML SOLN Take 3 mLs by nebulization 3 (three) times daily as needed.  Marland Kitchen. lisinopril (ZESTRIL) 20 MG tablet Take one tab a day for bp  . meclizine (ANTIVERT) 12.5 MG tablet Take 1 tablet (12.5 mg total) by mouth 3 (three) times daily as needed for dizziness.  . montelukast (SINGULAIR) 10 MG tablet Take 10 mg by mouth at bedtime.  . nortriptyline (PAMELOR) 10 MG capsule Take 20 mg by mouth at bedtime.  Marland Kitchen. QUEtiapine (SEROQUEL) 25 MG tablet Take 75 mg by mouth at bedtime.  Marland Kitchen. scopolamine (TRANSDERM-SCOP) 1 MG/3DAYS Place 1 patch (1.5 mg total) onto the skin every 3 (three) days.  . [DISCONTINUED] diltiazem (DILACOR XR) 240 MG 24 hr capsule Take 1 capsule (240 mg total) by mouth daily.    Allergies:   Lamotrigine and Tramadol   Social History   Socioeconomic History  . Marital status: Married    Spouse name: Christian Barrett  . Number of children: 0  . Years of education: Not on file  . Highest education level: Not on file  Occupational History  . Not on file  Tobacco Use  . Smoking status: Never Smoker  . Smokeless tobacco: Never Used  Vaping Use  . Vaping Use: Never used  Substance and Sexual Activity  . Alcohol use: Never  . Drug use: Never  . Sexual activity: Never  Other Topics Concern  . Not on file  Social History Narrative   LIVES AT HOME WITH WIFE IN PRIVATE RESIDENCE   Social Determinants of Health   Financial Resource  Strain: Not on file  Food Insecurity: Not on file  Transportation Needs: Not on file  Physical Activity: Not on file  Stress: Not on file  Social Connections: Not on file     Family History:  The patient's family history includes Coronary artery disease (age of onset: 2733) in his mother; Diabetes in his father; Heart attack in his paternal grandmother; Lung cancer in his mother.  ROS:   Review of Systems  Constitutional: Positive for malaise/fatigue. Negative for chills, diaphoresis, fever and weight loss.  HENT: Negative for congestion.   Eyes: Negative for discharge and redness.  Respiratory: Positive for shortness of breath and wheezing. Negative for cough and sputum production.   Cardiovascular: Positive  for chest pain. Negative for palpitations, orthopnea, claudication, leg swelling and PND.  Gastrointestinal: Positive for nausea. Negative for abdominal pain, heartburn and vomiting.  Musculoskeletal: Negative for falls and myalgias.  Skin: Negative for rash.  Neurological: Positive for dizziness and weakness. Negative for tingling, tremors, sensory change, speech change, focal weakness and loss of consciousness.  Endo/Heme/Allergies: Does not bruise/bleed easily.  Psychiatric/Behavioral: Negative for substance abuse. The patient is not nervous/anxious.   All other systems reviewed and are negative.    EKGs/Labs/Other Studies Reviewed:    Studies reviewed were summarized above. The additional studies were reviewed today:  [facility] 10/04/2019: Conclusions: 1. No angiographically significant coronary artery disease.  Question myocardial bridging of the distal LAD. 2. Mildly elevated left heart and pulmonary artery pressures.  Right heart pressures are moderately to severely increased, most likely related to obesity and untreated sleep apnea.  There is equalization of end-diastolic pressures in all chambers; restrictive process cannot be excluded.  Recommendations: 1. Initiate  low-dose diltiazem and work on primary prevention of coronary artery disease. 2. Weight loss and treatment of obstructive sleep apnea recommended. 3. Follow-up in the office in 2 weeks; plan for echocardiogram around that time. __________  2D echo 10/11/2019: 1. Left ventricular ejection fraction, by visual estimation, is 60 to  65%. The left ventricle has normal function. There is borderline left  ventricular hypertrophy.  2. Definity contrast agent was given IV to delineate the left ventricular  endocardial borders.  3. The left ventricle has no regional wall motion abnormalities.  4. Global right ventricle has normal systolic function.The right  ventricular size is mildly enlarged. Right vetricular wall thickness was  not assessed.  5. Left atrial size was normal.  6. Right atrial size was normal.  7. The mitral valve is grossly normal. No evidence of mitral valve  regurgitation. No evidence of mitral stenosis.  8. The tricuspid valve is not well visualized.  9. The tricuspid valve is not well visualized. Tricuspid valve  regurgitation is trivial.  10. The aortic valve was not well visualized. Aortic valve regurgitation  is not visualized. No evidence of aortic valve sclerosis or stenosis.  11. The pulmonic valve was not well visualized. Pulmonic valve  regurgitation is not visualized.  12. The interatrial septum was not well visualized.   EKG:  EKG is ordered today.  The EKG ordered today demonstrates NSR, 93 bpm, incomplete RBBB, no significant change when compared to prior tracing  Recent Labs: 04/23/2020: B Natriuretic Peptide 33.1 11/28/2020: BUN 18; Creatinine, Ser 0.60; Hemoglobin 13.2; Magnesium 2.1; Platelets 323; Potassium 3.9; Sodium 137; TSH 1.650  Recent Lipid Panel    Component Value Date/Time   CHOL 161 09/26/2019 0840   TRIG 106 09/26/2019 0840   HDL 52 09/26/2019 0840   CHOLHDL 3.1 09/26/2019 0840   LDLCALC 90 09/26/2019 0840    PHYSICAL EXAM:     VS:  BP 114/86 (BP Location: Right Arm, Patient Position: Sitting, Cuff Size: Large)   Pulse 92   Ht 5\' 5"  (1.651 m)   Wt (!) 372 lb (168.7 kg)   SpO2 98%   BMI 61.90 kg/m   BMI: Body mass index is 61.9 kg/m.  Physical Exam Vitals reviewed.  Constitutional:      Appearance: He is well-developed.     Comments: Obese  HENT:     Head: Normocephalic and atraumatic.  Eyes:     General:        Right eye: No discharge.  Left eye: No discharge.  Neck:     Vascular: No JVD.  Cardiovascular:     Rate and Rhythm: Normal rate and regular rhythm.     Pulses: No midsystolic click and no opening snap.          Posterior tibial pulses are 2+ on the right side and 2+ on the left side.     Heart sounds: Normal heart sounds, S1 normal and S2 normal. Heart sounds not distant. No murmur heard. No friction rub.  Pulmonary:     Effort: Pulmonary effort is normal. No respiratory distress.     Breath sounds: Normal breath sounds. No decreased breath sounds, wheezing or rales.  Chest:     Chest wall: No tenderness.  Abdominal:     General: There is no distension.     Palpations: Abdomen is soft.     Tenderness: There is no abdominal tenderness.  Musculoskeletal:     Cervical back: Normal range of motion.  Skin:    General: Skin is warm and dry.     Nails: There is no clubbing.  Neurological:     Mental Status: He is alert and oriented to person, place, and time.  Psychiatric:        Speech: Speech normal.        Behavior: Behavior normal.        Thought Content: Thought content normal.        Judgment: Judgment normal.     Wt Readings from Last 3 Encounters:  11/28/20 (!) 372 lb (168.7 kg)  11/23/20 (!) 374 lb 3.2 oz (169.7 kg)  10/16/20 (!) 368 lb (166.9 kg)     ASSESSMENT & PLAN:   1. Tachypalpitations: Historically he has been noted to have sinus tachycardia which is likely multifactorial including underlying physical deconditioning both morbid obesity and poorly  controlled asthma.  Check TSH, BMP, magnesium, and CBC.  Place a Zio patch to evaluate for significant arrhythmia.  If this shows his elevated heart rates are sinus tach I suspect this will be in the setting of the above and recommend further management of his weight, conditioning, and asthma.  We will resume long-acting diltiazem.  2. Chronic chest pain/SOB: His chronic dyspnea is likely multifactorial including poorly controlled asthma, morbid obesity, and physical deconditioning.  He does report a history of chronic chest pain that has been present for 20 years with cardiac cath demonstrating no evidence of obstructive CAD.  Given his dyspnea we will update an echo.  He reports he has previously been referred to a health and wellness center by his PCP though did not follow through with this as he did not have insurance at that time.  He now has insurance and I recommend he revisit this with his PCP.  Weight loss is strongly encouraged.  3. HTN: Blood pressure is well controlled in the office today.  Resume diltiazem as above.  He remains on Lasix.  4. Morbid obesity with OSA: Weight loss and continued compliance with his CPAP are recommended.  Cannot exclude some degree of OHS.  5. Asthma: Appears to be suboptimally controlled.  Recommend he follow-up with his pulmonologist.  Disposition: F/u with Dr. Okey Dupre or an APP in 3 months.   Medication Adjustments/Labs and Tests Ordered: Current medicines are reviewed at length with the patient today.  Concerns regarding medicines are outlined above. Medication changes, Labs and Tests ordered today are summarized above and listed in the Patient Instructions accessible in Encounters.   Signed,  Eula Listen, PA-C 11/28/2020 1:35 PM     [facility] [address] [place], [place] [address] [phone]

## 2020-11-28 NOTE — Patient Instructions (Signed)
Medication Instructions:  Refill sent in for your Diltiazem 240 mg once daily   *If you need a refill on your cardiac medications before your next appointment, please call your pharmacy*   Lab Work: Labs today are TSH, CBC, Mag, BMET to be done over the [facility] entrance.   If you have labs (blood work) drawn today and your tests are completely normal, you will receive your results only by: Marland Kitchen MyChart Message (if you have MyChart) OR . A paper copy in the mail If you have any lab test that is abnormal or we need to change your treatment, we will call you to review the results.   Testing/Procedures: Your physician has recommended that you wear a Zio monitor. Please remove monitor on April 6th and return in box provided.    This monitor is a medical device that records the heart's electrical activity. Doctors most often use these monitors to diagnose arrhythmias. Arrhythmias are problems with the speed or rhythm of the heartbeat. The monitor is a small device applied to your chest. You can wear one while you do your normal daily activities. While wearing this monitor if you have any symptoms to push the button and record what you felt. Once you have worn this monitor for the period of time provider prescribed (Usually 14 days), you will return the monitor device in the postage paid box. Once it is returned they will download the data collected and provide [place] with a report which the provider will then review and we will call you with those results. Important tips:  1. Avoid showering during the first 24 hours of wearing the monitor. 2. Avoid excessive sweating to help maximize wear time. 3. Do not submerge the device, no hot tubs, and no swimming pools. 4. Keep any lotions or oils away from the patch. 5. After 24 hours you may shower with the patch on. Take brief showers with your back facing the shower head.  6. Do not remove patch once it has been placed because that will interrupt  data and decrease adhesive wear time. 7. Push the button when you have any symptoms and write down what you were feeling. 8. Once you have completed wearing your monitor, remove and place into box which has postage paid and place in your outgoing mailbox.  9. If for some reason you have misplaced your box then call our office and we can provide another box and/or mail it off for you.      Your physician has requested that you have an echocardiogram.   Echocardiography is a painless test that uses sound waves to create images of your heart. It provides your doctor with information about the size and shape of your heart and how well your heart's chambers and valves are working. This procedure takes approximately one hour. There are no restrictions for this procedure.     Follow-Up: At [facility], you and your health needs are our priority.  As part of our continuing mission to provide you with exceptional heart care, we have created designated Provider Care Teams.  These Care Teams include your primary Cardiologist (physician) and Advanced Practice Providers (APPs -  Physician Assistants and Nurse Practitioners) who all work together to provide you with the care you need, when you need it.   Your next appointment:   3 month(s)  The format for your next appointment:   In Person  Provider:   Yvonne Kendall, MD or Eula Listen, PA-C

## 2020-11-29 ENCOUNTER — Telehealth: Payer: Self-pay | Admitting: Physician Assistant

## 2020-11-29 NOTE — Telephone Encounter (Signed)
Provider aware. New monitor mailed to patient by [facility]

## 2020-11-29 NOTE — Telephone Encounter (Signed)
fyi -  Patient monitor came off and [employer] is mailing out the replacement today.

## 2020-12-05 ENCOUNTER — Other Ambulatory Visit: Payer: 59

## 2020-12-07 IMAGING — CT CT ANGIO CHEST
1 series · 15 of 38 positions shown · IV contrast (APPLIED)
Comparison: Chest radiograph October 17, 2019

CLINICAL DATA: Shortness of breath. Recent hemoptysis and orthopnea

EXAM:
CT ANGIOGRAPHY CHEST WITH CONTRAST
TECHNIQUE: Multidetector CT imaging of the chest was performed using the
standard protocol during bolus administration of intravenous
contrast. Multiplanar CT image reconstructions and MIPs were
obtained to evaluate the vascular anatomy.
CONTRAST:  75mL OMNIPAQUE IOHEXOL 350 MG/ML SOLN

[Series 7: coronal mpr · coronal · 0.49mm/px · 15 of 106 slices shown]
[im 7/106  lung]
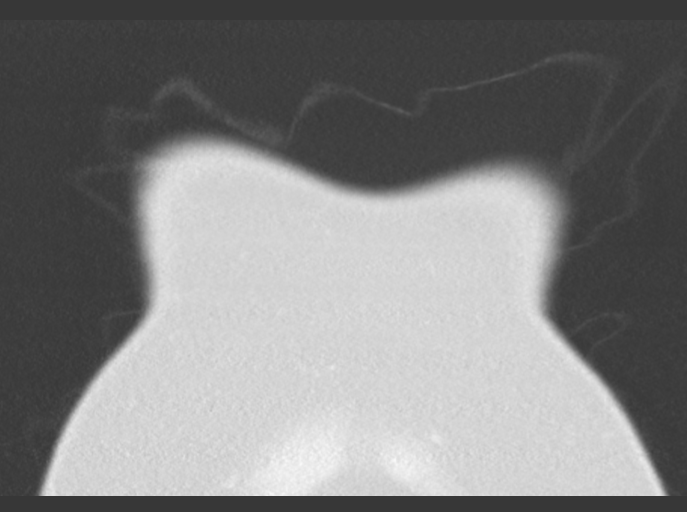
[im 14/106  soft-tissue]
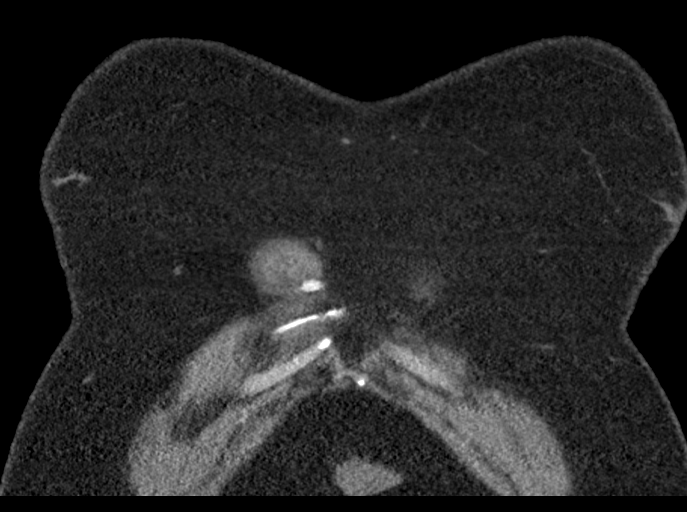
[im 21/106  lung]
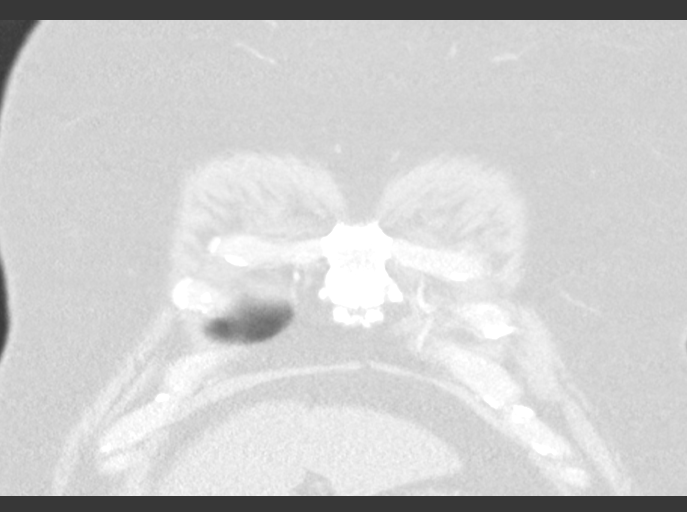
[im 28/106  soft-tissue]
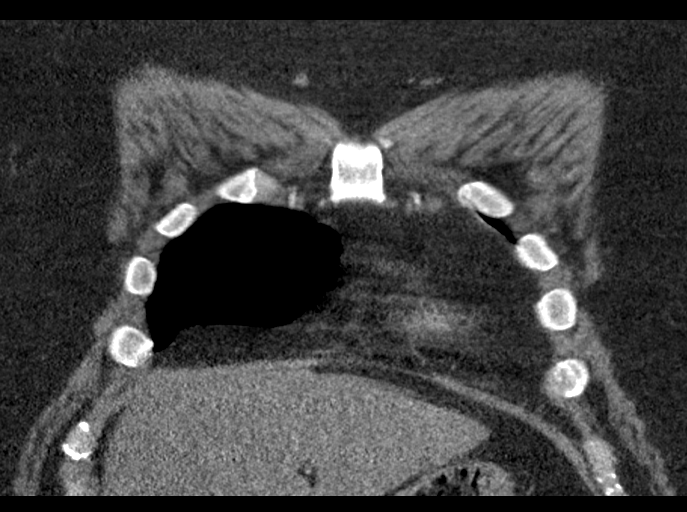
[im 34/106  lung]
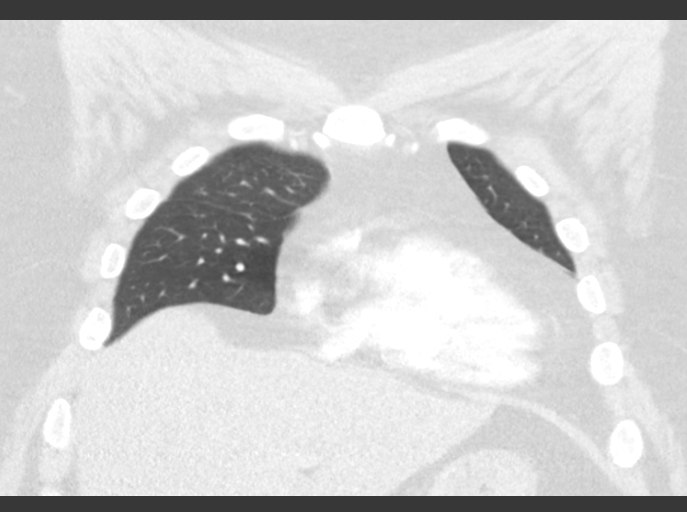
[im 41/106  soft-tissue]
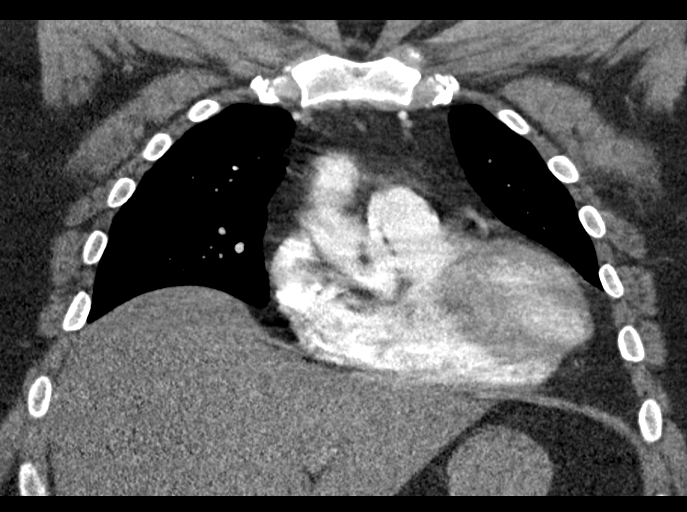
[im 48/106  lung]
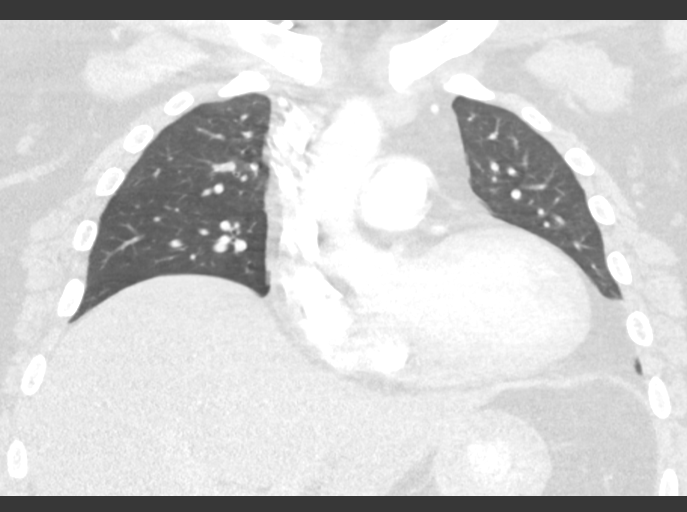
[im 55/106  soft-tissue]
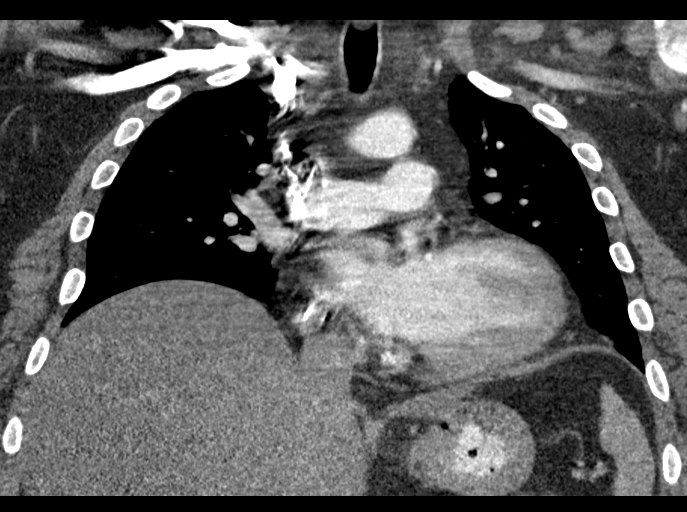
[im 61/106  lung]
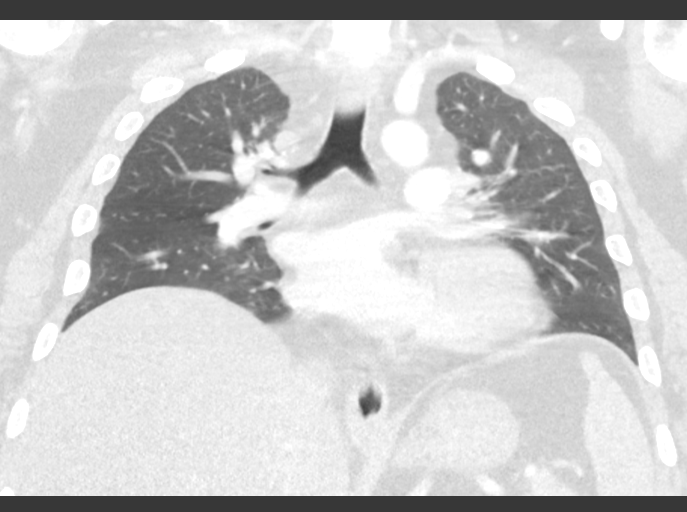
[im 68/106  soft-tissue]
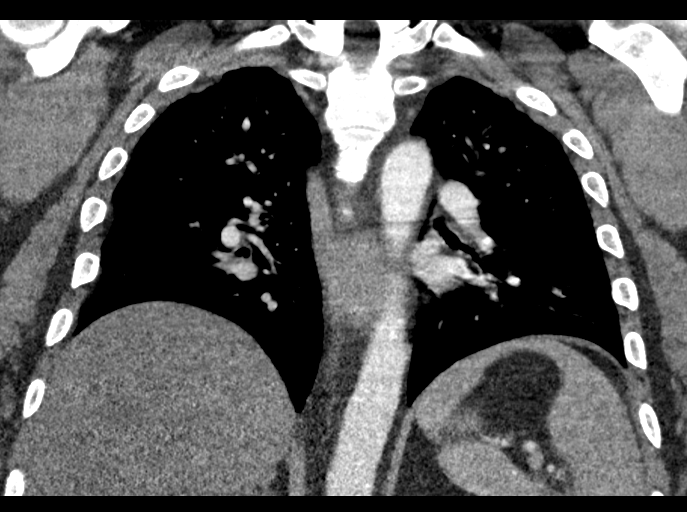
[im 75/106  lung]
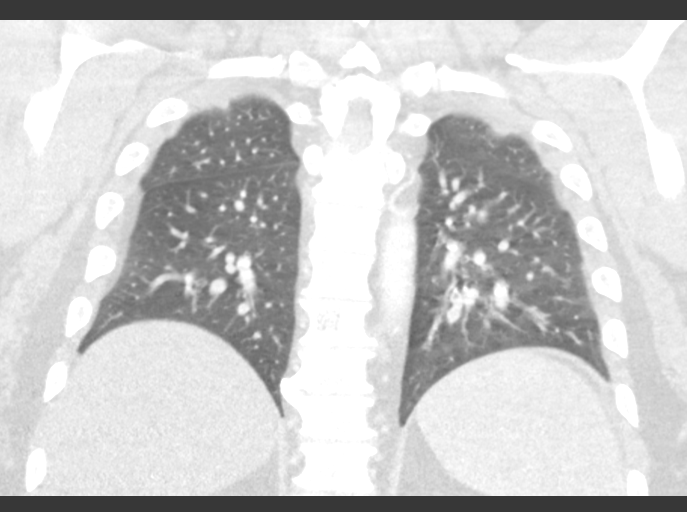
[im 82/106  soft-tissue]
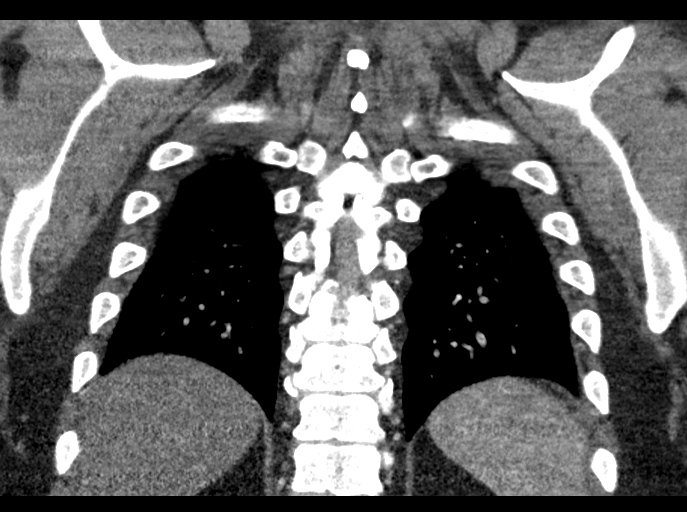
[im 89/106  lung]
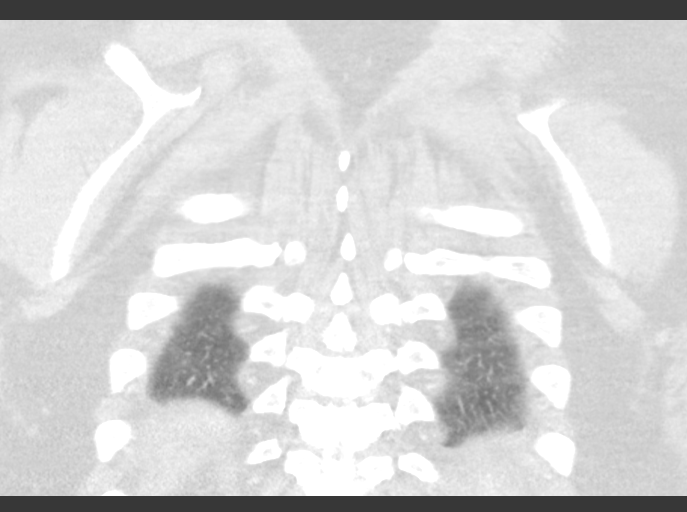
[im 95/106  soft-tissue]
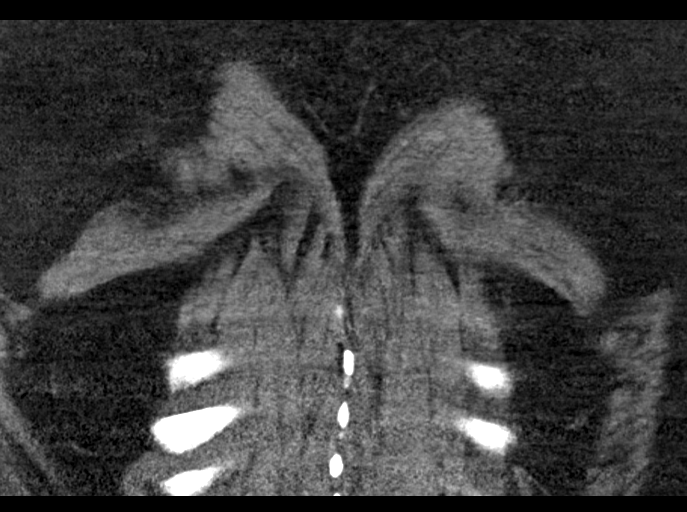
[im 102/106  lung]
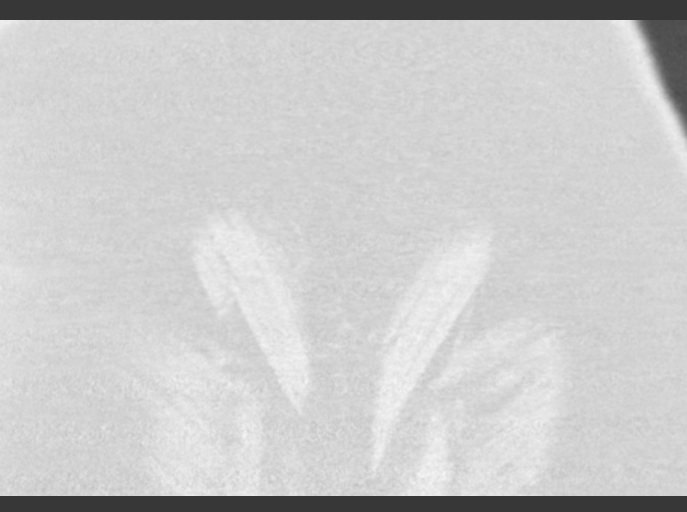

[15 of 38 positions shown; findings below may reference images not displayed]

FINDINGS: Cardiovascular: There is no demonstrable pulmonary embolus. There is
no thoracic aortic aneurysm or dissection. Visualized great vessels
appear normal. Note that the right innominate and left common
carotid arteries arise as a common trunk, an anatomic variant. No
pericardial effusion or pericardial thickening evident.

Mediastinum/Nodes: There is no appreciable thoracic adenopathy by
size criteria. There are scattered subcentimeter mediastinal and
axillary lymph nodes. Air in the distal esophagus may indicate a
patulous gastroesophageal junction. Esophagus otherwise appears
normal.

Lungs/Pleura: Lungs are clear.  No pleural effusions are evident.

Upper Abdomen: There is hepatic steatosis. Visualized upper
abdominal structures otherwise appear unremarkable.

Musculoskeletal: There is degenerative change in the lower thoracic
spine. No blastic or lytic bone lesions. No evident chest wall
lesions.

Review of the MIP images confirms the above findings.
IMPRESSION: 1. No demonstrable pulmonary embolus. No thoracic aortic aneurysm or
dissection.

2.  Lungs clear.

3.  No adenopathy.

4.  Hepatic steatosis.

## 2020-12-07 IMAGING — CR DG CHEST 2V
2 series · 2 of 2 positions shown · non-contrast
Comparison: 03/15/2019

CLINICAL DATA: Chest pain and hemoptysis

EXAM:
CHEST - 2 VIEW

[chest pa]
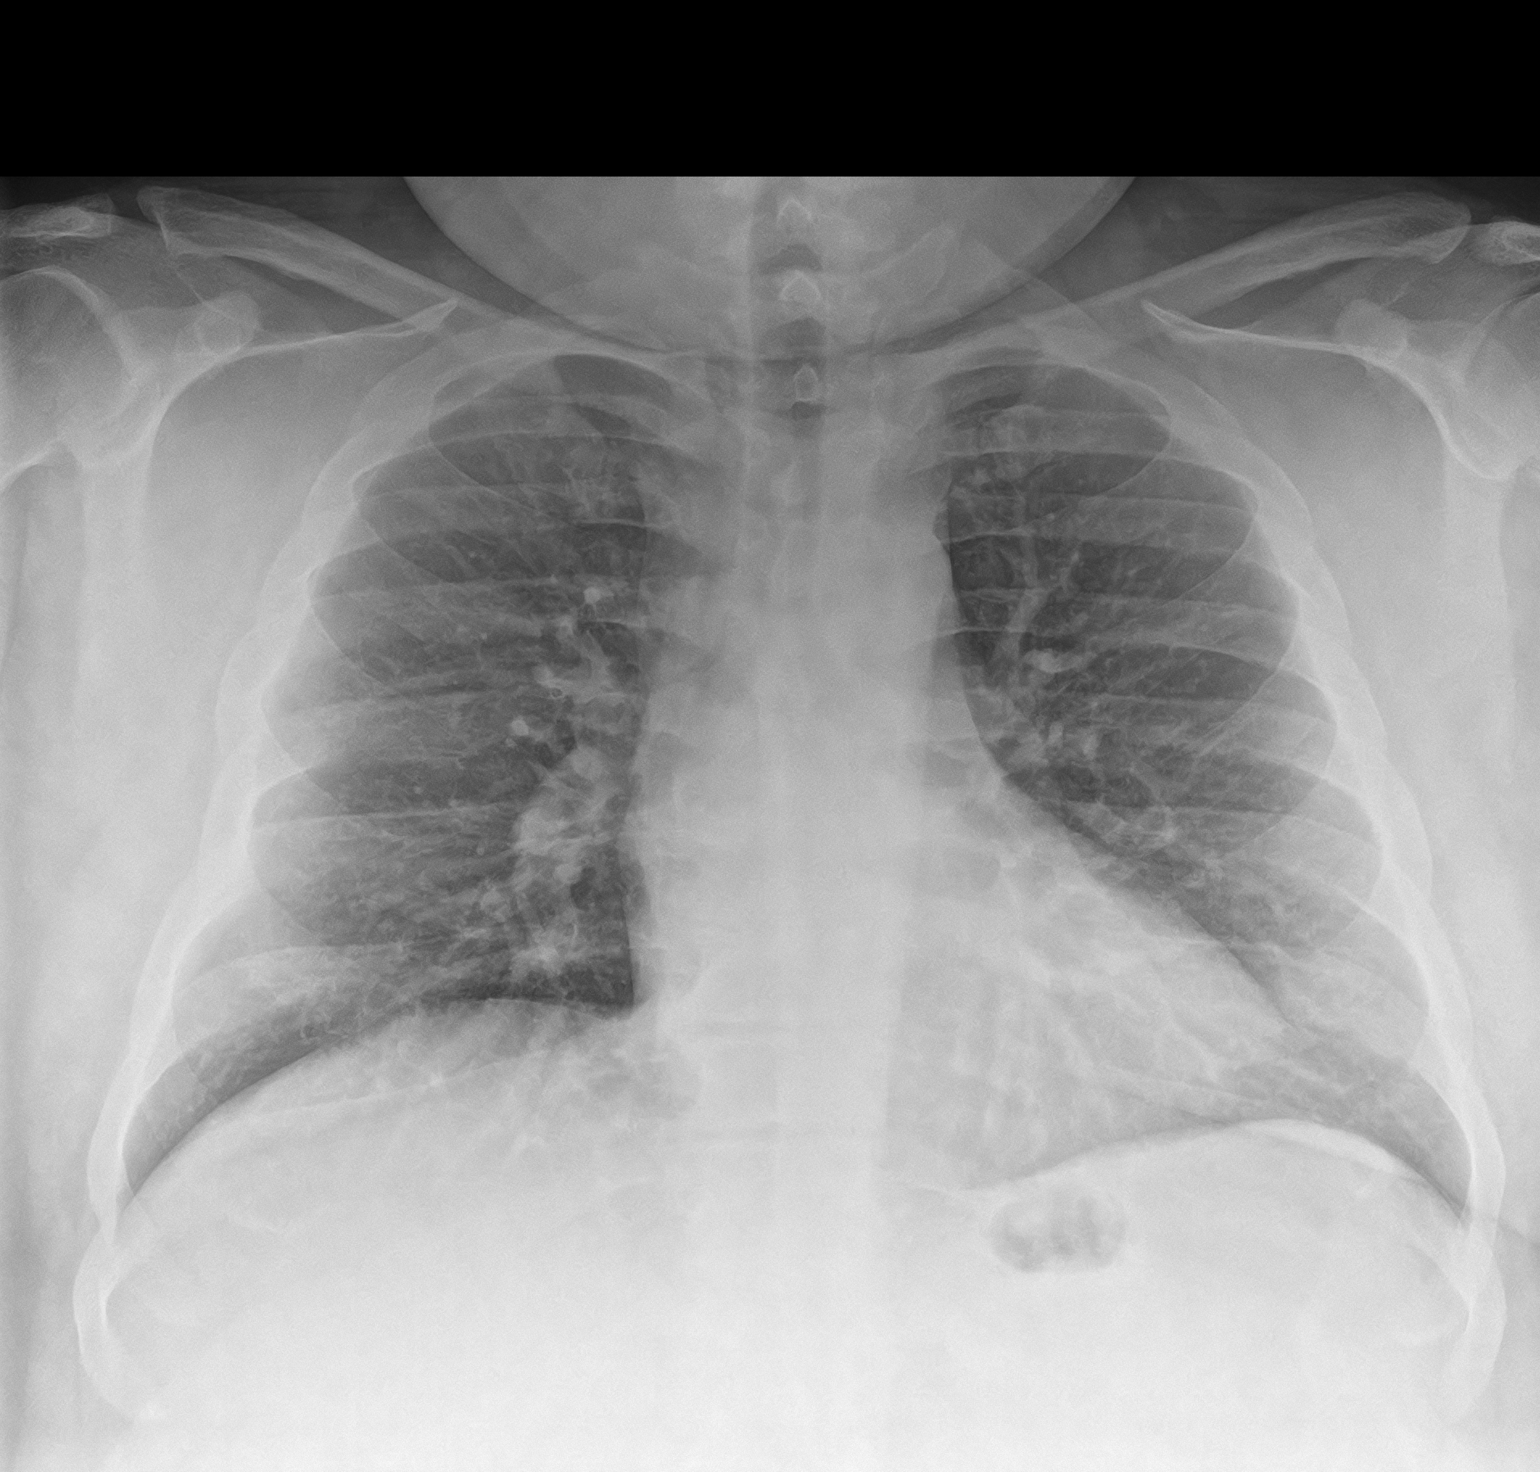

[chest lat]
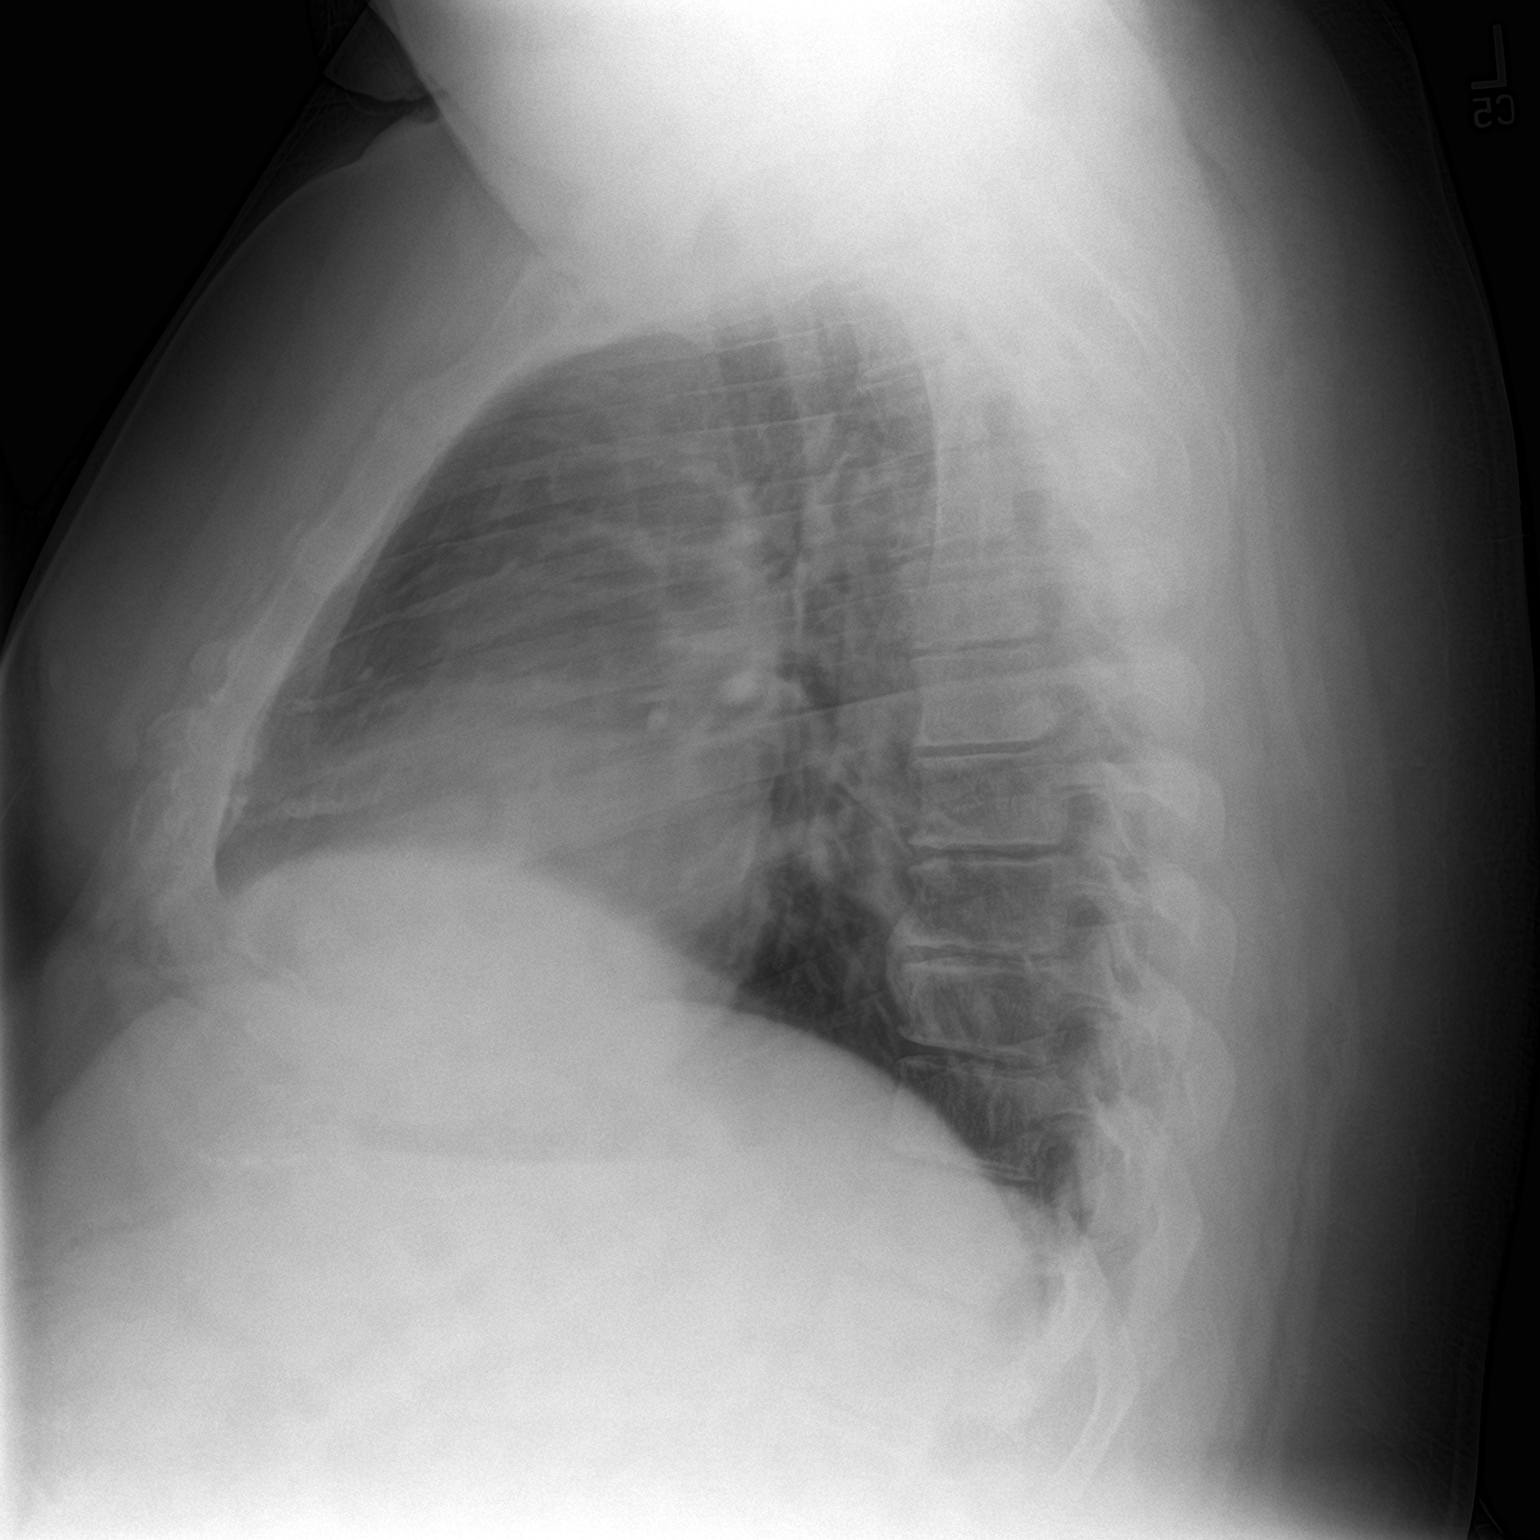

[2 of 2 positions shown; findings below may reference images not displayed]

FINDINGS: Cardiac shadows within normal limits. The lungs are well aerated
bilaterally. No focal infiltrate or sizable effusion is seen. No
acute bony abnormality is noted.
IMPRESSION: No active cardiopulmonary disease.

## 2020-12-21 ENCOUNTER — Telehealth: Payer: Self-pay

## 2020-12-21 NOTE — Telephone Encounter (Signed)
Attempted to reach pt via phone, LDM on VM (DPR approved), advised Eula Listen PA-C results after review of Zio monitor  "Cardiac monitor showed a predominant rhythm of sinus with an average rate of 100-105 bpm with rare PACs and PVCs. No sustained arrhythmias or prolonged pauses were noted. Patient triggered events corresponded to sinus rhythm and sinus tachycardia. Overall, reassuring monitor. Await echo."   Advised to call back with and concerns or questions, if not will await for echo results.

## 2020-12-22 ENCOUNTER — Other Ambulatory Visit: Payer: Self-pay | Admitting: Internal Medicine

## 2020-12-26 ENCOUNTER — Encounter: Payer: Self-pay | Admitting: Hospice and Palliative Medicine

## 2020-12-26 ENCOUNTER — Ambulatory Visit (INDEPENDENT_AMBULATORY_CARE_PROVIDER_SITE_OTHER): Payer: 59 | Admitting: Hospice and Palliative Medicine

## 2020-12-26 ENCOUNTER — Other Ambulatory Visit: Payer: Self-pay

## 2020-12-26 VITALS — BP 140/94 | HR 101 | Temp 98.2°F | Resp 16 | Wt 368.2 lb

## 2020-12-26 DIAGNOSIS — R000 Tachycardia, unspecified: Secondary | ICD-10-CM | POA: Diagnosis not present

## 2020-12-26 DIAGNOSIS — E119 Type 2 diabetes mellitus without complications: Secondary | ICD-10-CM | POA: Diagnosis not present

## 2020-12-26 DIAGNOSIS — R5383 Other fatigue: Secondary | ICD-10-CM

## 2020-12-26 DIAGNOSIS — R7301 Impaired fasting glucose: Secondary | ICD-10-CM

## 2020-12-26 DIAGNOSIS — G4734 Idiopathic sleep related nonobstructive alveolar hypoventilation: Secondary | ICD-10-CM | POA: Diagnosis not present

## 2020-12-26 DIAGNOSIS — I10 Essential (primary) hypertension: Secondary | ICD-10-CM | POA: Diagnosis not present

## 2020-12-26 DIAGNOSIS — K921 Melena: Secondary | ICD-10-CM

## 2020-12-26 DIAGNOSIS — R21 Rash and other nonspecific skin eruption: Secondary | ICD-10-CM

## 2020-12-26 LAB — POCT GLYCOSYLATED HEMOGLOBIN (HGB A1C): Hemoglobin A1C: 6.5 % — AB (ref 4.0–5.6)

## 2020-12-26 MED ORDER — TRULICITY 0.75 MG/0.5ML ~~LOC~~ SOAJ: 0.7500 mg | SUBCUTANEOUS | 3 refills | Status: DC

## 2020-12-26 NOTE — Progress Notes (Signed)
[facility] [address] [place], [place] [address]  Internal MEDICINE  Office Visit Note  Patient Name: Christian Barrett  [id number]  [id number]  Date of Service: 01/02/2021  Chief Complaint  Patient presents with  . Asthma  . Hypertension  . Quality Metric Gaps    Foot exam, eye exam   . Rash    Skin issue on lower back, Lost of hair, nose bleeds a few ago, tired most of the time. Headache has increased in the last week, med review      HPI Patient is here for routine follow-up Motion sickness from traveling on cruise has resolved Has been seen by cardiology since last visit and has worn an event monitor but has not returned for follow-up to discuss results Was prescribed diltiazem but has not been taking medication--cannot recall why he stopped taking Reviewed ONO--qualifies for supplemental oxygen to be bled through his CPAP Concerned about circular darkened patches on abdomen and gluteals and legs non pruritic  Also concerned that his stool have been darker in color--black on and off for the last few weeks Denies constipation  DM-does not routinely monitor his glucose levels, fairly consistent with taking Trulicity weekly  BP elevated--tries to remember to take his lisinopril daily but does forget some doses  Current Medication: Outpatient Encounter Medications as of 12/26/2020  Medication Sig  . Dulaglutide (TRULICITY) 0.75 MG/0.5ML SOPN Inject 0.75 mg into the skin once a week.  . furosemide (LASIX) 40 MG tablet TAKE 1 TABLET BY MOUTH EVERY DAY  . gabapentin (NEURONTIN) 300 MG capsule 300mg   in the am, 300mg  at lunch, and 600MG  in the pm.  . ipratropium-albuterol (DUONEB) 0.5-2.5 (3) MG/3ML SOLN Take 3 mLs by nebulization 3 (three) times daily as needed.  lisinopril (ZESTRIL) 20 MG tablet Take one tab a day for bp  . montelukast (SINGULAIR) 10 MG tablet Take 10 mg by mouth at bedtime.  . nortriptyline (PAMELOR) 10 MG capsule Take 20 mg by mouth at  bedtime.  QUEtiapine (SEROQUEL) 25 MG tablet Take 75 mg by mouth at bedtime.  . [DISCONTINUED] diltiazem (DILACOR XR) 240 MG 24 hr capsule Take 1 capsule (240 mg total) by mouth daily.  . [DISCONTINUED] etodolac (LODINE) 500 MG tablet Take 500 mg by mouth 2 (two) times daily.  . [DISCONTINUED] meclizine (ANTIVERT) 12.5 MG tablet Take 1 tablet (12.5 mg total) by mouth 3 (three) times daily as needed for dizziness.  . [DISCONTINUED] scopolamine (TRANSDERM-SCOP) 1 MG/3DAYS Place 1 patch (1.5 mg total) onto the skin every 3 (three) days.   No facility-administered encounter medications on file as of 12/26/2020.    Surgical History: Past Surgical History:  Procedure Laterality Date  . BACK SURGERY    . CARDIAC CATHETERIZATION    . RIGHT/LEFT HEART CATH AND CORONARY ANGIOGRAPHY N/A 10/04/2019   Procedure: RIGHT/LEFT HEART CATH AND CORONARY ANGIOGRAPHY;  Surgeon: Marland Kitchen, MD;  Location: [facility] INVASIVE CV LAB;  Service: Cardiovascular;  Laterality: N/A;  . SHOULDER ARTHROSCOPY  2018   HAD BONE SPURS REMOVED    Medical History: Past Medical History:  Diagnosis Date  . Asthma   . Bipolar disorder (HCC)   . Chest pain    a. 09/2019 Cath: LM nl, LAD nl w/ ? distal myocardial bridge, LCX nl, RCA nl. EF 65%. LVEDP 20, RA 16, PA 37/20(26), PCWP 20. CO/CI 8.0/3.2.  . DOE (dyspnea on exertion)    a. 10/2019 Echo: EF 60-65%, borderline LVH. NO rwma. Nl RV  fxn. Triv TR.  Marland Kitchen Hepatic steatosis   . Hypertension   . Morbid obesity (HCC)   . Renal disorder     Family History: Family History  Problem Relation Age of Onset  . Coronary artery disease Mother 7       CABG  . Lung cancer Mother   . Diabetes Father   . Heart attack Paternal Grandmother     Social History   Socioeconomic History  . Marital status: Married    Spouse name: Printmaker  . Number of children: 0  . Years of education: Not on file  . Highest education level: Not on file  Occupational History  . Not on file   Tobacco Use  . Smoking status: Never Smoker  . Smokeless tobacco: Never Used  Vaping Use  . Vaping Use: Never used  Substance and Sexual Activity  . Alcohol use: Never  . Drug use: Never  . Sexual activity: Never  Other Topics Concern  . Not on file  Social History Narrative   LIVES AT HOME WITH WIFE IN PRIVATE RESIDENCE   Social Determinants of Health   Financial Resource Strain: Not on file  Food Insecurity: Not on file  Transportation Needs: Not on file  Physical Activity: Not on file  Stress: Not on file  Social Connections: Not on file  Intimate Partner Violence: Not on file      Review of Systems  Constitutional: Negative for chills, fatigue and unexpected weight change.  HENT: Negative for congestion, postnasal drip, rhinorrhea, sneezing and sore throat.   Eyes: Negative for redness.  Respiratory: Negative for cough, chest tightness and shortness of breath.   Cardiovascular: Negative for chest pain and palpitations.  Gastrointestinal: Negative for abdominal pain, constipation, diarrhea, nausea and vomiting.  Genitourinary: Negative for dysuria and frequency.  Musculoskeletal: Negative for arthralgias, back pain, joint swelling and neck pain.  Skin: Positive for rash.  Neurological: Negative for tremors and numbness.  Hematological: Negative for adenopathy. Does not bruise/bleed easily.  Psychiatric/Behavioral: Negative for behavioral problems (Depression), sleep disturbance and suicidal ideas. The patient is not nervous/anxious.     Vital Signs: BP (!) 140/94   Pulse (!) 101   Temp 98.2 F (36.8 C)   Resp 16   Wt (!) 368 lb 3.2 oz (167 kg)   SpO2 98%   BMI 61.27 kg/m    Physical Exam Vitals reviewed.  Constitutional:      Appearance: Normal appearance. He is obese.  Cardiovascular:     Rate and Rhythm: Regular rhythm. Tachycardia present.     Pulses: Normal pulses.     Heart sounds: Normal heart sounds.  Pulmonary:     Effort: Pulmonary effort  is normal.     Breath sounds: Normal breath sounds.  Abdominal:     General: Abdomen is flat.     Palpations: Abdomen is soft.  Musculoskeletal:        General: Normal range of motion.     Cervical back: Normal range of motion.  Skin:    Findings: Rash present.  Neurological:     General: No focal deficit present.     Mental Status: He is alert and oriented to person, place, and time. Mental status is at baseline.  Psychiatric:        Mood and Affect: Mood normal.        Behavior: Behavior normal.        Thought Content: Thought content normal.  Judgment: Judgment normal.    Assessment/Plan: 1. Type 2 diabetes mellitus without complication, without long-term current use of insulin A1C 6.5--continue with Trulicity, requesting refills - POCT HgB A1C - Dulaglutide (TRULICITY) 0.75 MG/0.5ML SOPN; Inject 0.75 mg into the skin once a week.  Dispense: 2 mL; Refill: 3  2. Essential hypertension BP and HR elevated--encouraged medication adherence Hypertension Counseling:   The following hypertensive lifestyle modification were recommended and discussed:  1. Limiting alcohol intake to less than 1 oz/day of ethanol:(24 oz of beer or 8 oz of wine or 2 oz of 100-proof whiskey). 2. Take baby ASA 81 mg daily. 3. Importance of regular aerobic exercise and losing weight. 4. Reduce dietary saturated fat and cholesterol intake for overall cardiovascular health. 5. Maintaining adequate dietary potassium, calcium, and magnesium intake. 6. Regular monitoring of the blood pressure. 7. Reduce sodium intake to less than 100 mmol/day (less than 2.3 gm of sodium or less than 6 gm of sodium choride)   3. Sinus tachycardia Encouraged to call and set up follow-up with cardiology and to start daily Diltiazem Encouraged medication compliance  4. Nocturnal hypoxia Qualifies for supplemental oxygen to be bled through CPAP - For home use only DME oxygen  5. Rash and nonspecific skin eruption -  Ambulatory referral to Dermatology  6. Black tarry stools Review results and treat accordingly - Fecal occult blood, imunochemical([facility]/Sun[facility])  7. Other fatigue - CBC w/Diff/Platelet - Comprehensive Metabolic Panel (CMET) - Lipid Panel With LDL/HDL Ratio  General Counseling: ranson belluomini understanding of the findings of todays visit and agrees with plan of treatment. I have discussed any further diagnostic evaluation that may be needed or ordered today. We also reviewed his medications today. he has been encouraged to call the office with any questions or concerns that should arise related to todays visit.    Orders Placed This Encounter  Procedures  . For home use only DME oxygen  . Fecal occult blood, imunochemical([facility]/Sunquest)  . CBC w/Diff/Platelet  . Comprehensive Metabolic Panel (CMET)  . Lipid Panel With LDL/HDL Ratio  . Ambulatory referral to Dermatology  . POCT HgB A1C    Meds ordered this encounter  Medications  . Dulaglutide (TRULICITY) 0.75 MG/0.5ML SOPN    Sig: Inject 0.75 mg into the skin once a week.    Dispense:  2 mL    Refill:  3    Time spent: 30 Minutes Time spent includes review of chart, medications, test results and follow-up plan with the patient.  This patient was seen by Leeanne Deed AGNP-C in Collaboration with Dr Lyndon Code as a part of collaborative care agreement     Lubertha Basque. Yaileen Hofferber AGNP-C Internal medicine

## 2020-12-27 ENCOUNTER — Telehealth: Payer: Self-pay

## 2020-12-27 NOTE — Telephone Encounter (Signed)
Pt called c/o his blood sugar being 250 and he was fatigue and very sleepy.  I asked pt what his diet was for today and he has eaten hamburger and onion rings.  I explained that he needed to change his eating habits and watch his carb intake.  I advised pt to go to [email or web address] and he can go by there portion and diet to help make better choices.    Pt also stated that pharmacy said they were out of the trulicity.  I advised pt to call pharmacy back and see if the prescription could be sent to a different pharmacy that has the medication

## 2020-12-31 ENCOUNTER — Other Ambulatory Visit: Payer: Self-pay | Admitting: *Deleted

## 2020-12-31 DIAGNOSIS — I10 Essential (primary) hypertension: Secondary | ICD-10-CM

## 2020-12-31 MED ORDER — DILTIAZEM HCL ER 240 MG PO CP24: 240.0000 mg | ORAL_CAPSULE | Freq: Every day | ORAL | 11 refills | Status: DC

## 2021-01-01 ENCOUNTER — Other Ambulatory Visit: Payer: Self-pay | Admitting: Hospice and Palliative Medicine

## 2021-01-01 DIAGNOSIS — G4734 Idiopathic sleep related nonobstructive alveolar hypoventilation: Secondary | ICD-10-CM

## 2021-01-01 NOTE — Progress Notes (Signed)
Orders for supplemental oxygen placed to be bled through CPAP due to results of ONO and history of elevated pulmonary artery pressures.

## 2021-01-02 ENCOUNTER — Telehealth: Payer: Self-pay

## 2021-01-02 ENCOUNTER — Encounter: Payer: Self-pay | Admitting: Hospice and Palliative Medicine

## 2021-01-02 NOTE — Telephone Encounter (Signed)
Spoke to pt and informed him that he qualified for oxygen that bleds thru Cpap.  Order was placed thru epic with [facility]

## 2021-01-23 ENCOUNTER — Ambulatory Visit: Payer: 59 | Admitting: Hospice and Palliative Medicine

## 2021-01-25 ENCOUNTER — Other Ambulatory Visit: Payer: Self-pay | Admitting: Internal Medicine

## 2021-01-25 DIAGNOSIS — I10 Essential (primary) hypertension: Secondary | ICD-10-CM

## 2021-01-26 ENCOUNTER — Emergency Department (HOSPITAL_COMMUNITY): Payer: 59

## 2021-01-26 ENCOUNTER — Other Ambulatory Visit: Payer: Self-pay

## 2021-01-26 ENCOUNTER — Emergency Department (HOSPITAL_COMMUNITY)
Admission: EM | Admit: 2021-01-26 | Discharge: 2021-01-26 | Disposition: A | Discharge status: Home / Self Care | Payer: 59 | Attending: Emergency Medicine | Admitting: Emergency Medicine

## 2021-01-26 DIAGNOSIS — M545 Low back pain, unspecified: Secondary | ICD-10-CM | POA: Insufficient documentation

## 2021-01-26 DIAGNOSIS — J4551 Severe persistent asthma with (acute) exacerbation: Secondary | ICD-10-CM | POA: Insufficient documentation

## 2021-01-26 DIAGNOSIS — Z794 Long term (current) use of insulin: Secondary | ICD-10-CM | POA: Diagnosis not present

## 2021-01-26 DIAGNOSIS — Z79899 Other long term (current) drug therapy: Secondary | ICD-10-CM | POA: Diagnosis not present

## 2021-01-26 DIAGNOSIS — I10 Essential (primary) hypertension: Secondary | ICD-10-CM | POA: Insufficient documentation

## 2021-01-26 DIAGNOSIS — M79605 Pain in left leg: Secondary | ICD-10-CM | POA: Insufficient documentation

## 2021-01-26 DIAGNOSIS — M79604 Pain in right leg: Secondary | ICD-10-CM | POA: Diagnosis not present

## 2021-01-26 DIAGNOSIS — E1140 Type 2 diabetes mellitus with diabetic neuropathy, unspecified: Secondary | ICD-10-CM | POA: Diagnosis not present

## 2021-01-26 DIAGNOSIS — M5136 Other intervertebral disc degeneration, lumbar region: Secondary | ICD-10-CM

## 2021-01-26 MED ORDER — METHYLPREDNISOLONE 4 MG PO TBPK: ORAL_TABLET | ORAL | 0 refills | Status: DC

## 2021-01-26 MED ORDER — KETOROLAC TROMETHAMINE 30 MG/ML IJ SOLN
30.0000 mg | Freq: Once | INTRAMUSCULAR | Status: AC
  Administered 2021-01-26: 30 mg via INTRAVENOUS
  Filled 2021-01-26: qty 1

## 2021-01-26 MED ORDER — MORPHINE SULFATE (PF) 4 MG/ML IV SOLN
4.0000 mg | Freq: Once | INTRAVENOUS | Status: AC
  Administered 2021-01-26: 4 mg via INTRAMUSCULAR
  Filled 2021-01-26: qty 1

## 2021-01-26 MED ORDER — CYCLOBENZAPRINE HCL 10 MG PO TABS: 10.0000 mg | ORAL_TABLET | Freq: Two times a day (BID) | ORAL | 0 refills | Status: DC | PRN

## 2021-01-26 MED ORDER — HYDROMORPHONE HCL 1 MG/ML IJ SOLN
0.5000 mg | Freq: Once | INTRAMUSCULAR | Status: AC
  Administered 2021-01-26: 0.5 mg via INTRAVENOUS
  Filled 2021-01-26: qty 1

## 2021-01-26 NOTE — ED Notes (Signed)
Got patient on the monitor patient is resting with call bell in reach  ?

## 2021-01-26 NOTE — ED Triage Notes (Signed)
Pt POV from home with c/o back pain and right sided hip pain after fall this AM.  Pt endorses difficulty walking due to pain, feels pins and needles sensation in right hip and back.

## 2021-01-26 NOTE — ED Notes (Signed)
Attempted to ambulate pt.  Pt only able to ambulate with walker.  MD notified.

## 2021-01-26 NOTE — ED Provider Notes (Signed)
  Physical Exam  BP (!) 145/88 (BP Location: Left Wrist)   Pulse 94   Temp 98.4 F (36.9 C) (Oral)   Resp 16   Ht 5\' 5"  (1.651 m)   Wt (!) 158.8 kg   SpO2 100%   BMI 58.24 kg/m   Physical Exam  ED Course/Procedures   Clinical Course as of 01/26/21 1801  Sat Jan 26, 2021  1603 Pt signed out to Dr Jan 28, 2021 pending MRI read, reassessment of pain after medications. [MT]    Clinical Course User Index [MT] Trifan, Dalene Seltzer, MD    Procedures  MDM  Received care of patient from Dr. Kermit Balo.  Please see his note for history/physical and prior care.  Briefly, this is a 35 year old male who presented with concern for low back pain and difficulty ambulating and has a an MRI pending.   MRI shows mild degenerative changes with mild bilateral foraminal stenosis, and slight increase in size of subligamentous disc extrusion without neural impingement or canal stenosis.  Findings are not consistent with cauda equina.  Discussed risks risks and benefits of steroids, and at this time given degree of pain he is experiencing, do feel steroids are appropriate.  Discussed the risk of hyperglycemia with him in detail.  Given prescription for Flexeril, recommend ibuprofen or Tylenol. Given rx for walker and recommend follow up with spine surgery as an outpatient. Patient discharged in stable condition with understanding of reasons to return.         31, MD 01/28/21 [phone]
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
## 2021-01-26 NOTE — ED Notes (Signed)
Patient transported to MRI 

## 2021-01-30 ENCOUNTER — Encounter: Payer: Self-pay | Admitting: Internal Medicine

## 2021-01-31 NOTE — Telephone Encounter (Signed)
Please see this, provider schedule is changed

## 2021-02-01 ENCOUNTER — Encounter: Payer: Self-pay | Admitting: Nurse Practitioner

## 2021-02-01 ENCOUNTER — Ambulatory Visit (INDEPENDENT_AMBULATORY_CARE_PROVIDER_SITE_OTHER): Payer: 59 | Admitting: Nurse Practitioner

## 2021-02-01 ENCOUNTER — Other Ambulatory Visit: Payer: Self-pay

## 2021-02-01 VITALS — BP 136/106 | HR 113 | Temp 98.2°F | Resp 16 | Ht 65.0 in | Wt 363.8 lb

## 2021-02-01 DIAGNOSIS — M5442 Lumbago with sciatica, left side: Secondary | ICD-10-CM

## 2021-02-01 DIAGNOSIS — M5441 Lumbago with sciatica, right side: Secondary | ICD-10-CM | POA: Diagnosis not present

## 2021-02-01 DIAGNOSIS — R200 Anesthesia of skin: Secondary | ICD-10-CM

## 2021-02-01 MED ORDER — MELOXICAM 15 MG PO TABS: 15.0000 mg | ORAL_TABLET | Freq: Every day | ORAL | 0 refills | Status: DC

## 2021-02-01 NOTE — Progress Notes (Signed)
[facility] [place] [facility] [address] [place], [place] [address]  Internal MEDICINE  Office Visit Note  Patient Name: Christian Barrett  [id number]  [id number]  Date of Service: 02/04/2021  Chief Complaint  Patient presents with  . Fall    Pt fell down stairs on 01/26/21 went to ER  . Back Pain    Spasms, feels a lot of heat  . Shoulder Pain     HPI Delano presents for an acute sick visit for severe back pain after a fall. He reports that he has a problem with his chronic back pain causing sciatica and his legs and feet often go numb. He fell on may 21st because he stood up and his legs and feet were numb.  An MRI of the lumbar spine was done while he was at the ER. The results are as follows: IMPRESSION:  1. Mild degenerative changes of the lumbar spine, as detailed above.  2. Mild bilateral foraminal stenosis at L3-L4, L4-L5, and left  foraminal stenosis at L5-S1.  3. Slight interval increase in size of a central subligamentous disc  extrusion at T12-L1 without neural impingement or canal stenosis.   He reports bilateral low back pain with bilateral sciatica, numbness and tingling. He statges that he does have back spasms and cannot sleep because of the pain. He is picking up the prednisone prescription from the pharmacy today. The ED provider recommended a neurology referral for possible back surgery due to the slight interval increase of  A central subligamentous disc extrusion at T12-L1 approx 76mm. May require neurology or a spinal surgeon.   Current Medication:  Outpatient Encounter Medications as of 02/01/2021  Medication Sig  . cyclobenzaprine (FLEXERIL) 10 MG tablet Take 1 tablet (10 mg total) by mouth 2 (two) times daily as needed for muscle spasms.  Marland Kitchen diltiazem (DILACOR XR) 240 MG 24 hr capsule Take 1 capsule (240 mg total) by mouth daily.  . Dulaglutide (TRULICITY) 0.75 MG/0.5ML SOPN Inject 0.75 mg into the skin once a week.  . furosemide (LASIX) 40 MG tablet TAKE  1 TABLET BY MOUTH EVERY DAY (Patient taking differently: Take 40 mg by mouth daily.)  . gabapentin (NEURONTIN) 300 MG capsule Take 300 mg by mouth 4 (four) times daily. 300mg   in the am, 300mg  at lunch, and 600MG  in the pm.  . Ibuprofen-Acetaminophen (ADVIL DUAL ACTION) 125-250 MG TABS Take 2 tablets by mouth 3 (three) times daily as needed (pain).  ipratropium-albuterol (DUONEB) 0.5-2.5 (3) MG/3ML SOLN Take 3 mLs by nebulization 3 (three) times daily as needed.  lisinopril (ZESTRIL) 20 MG tablet TAKE ONE TAB A DAY FOR BLOOD PRESSURE (Patient taking differently: Take 20 mg by mouth daily. TAKE ONE TAB A DAY FOR BLOOD PRESSURE)  . meloxicam (MOBIC) 15 MG tablet Take 1 tablet (15 mg total) by mouth daily.  . methylPREDNISolone (MEDROL DOSEPAK) 4 MG TBPK tablet See packet  . montelukast (SINGULAIR) 10 MG tablet Take 10 mg by mouth at bedtime.  . nortriptyline (PAMELOR) 10 MG capsule Take 20 mg by mouth at bedtime.  . OXYGEN Inhale into the lungs. Bled thru Cpap machine.  Order placed with [facility] 01/02/21  . QUEtiapine (SEROQUEL) 25 MG tablet Take 75 mg by mouth at bedtime.   No facility-administered encounter medications on file as of 02/01/2021.      Medical History: Past Medical History:  Diagnosis Date  . Asthma   . Bipolar disorder (HCC)   . Chest pain    a.  09/2019 Cath: LM nl, LAD nl w/ ? distal myocardial bridge, LCX nl, RCA nl. EF 65%. LVEDP 20, RA 16, PA 37/20(26), PCWP 20. CO/CI 8.0/3.2.  . DOE (dyspnea on exertion)    a. 10/2019 Echo: EF 60-65%, borderline LVH. NO rwma. Nl RV fxn. Triv TR.  Marland Kitchen Hepatic steatosis   . Hypertension   . Morbid obesity (HCC)   . Renal disorder      Vital Signs: BP (!) 136/106   Pulse (!) 113   Temp 98.2 F (36.8 C)   Resp 16   Ht 5\' 5"  (1.651 m)   Wt (!) 363 lb 12.8 oz (165 kg)   SpO2 95%   BMI 60.54 kg/m    Review of Systems  Constitutional: Negative for chills, fatigue and fever.  HENT: Negative.   Respiratory: Positive for  shortness of breath. Negative for cough, choking, chest tightness and wheezing.   Cardiovascular: Negative for chest pain.  Gastrointestinal: Negative.   Genitourinary: Negative.   Musculoskeletal: Positive for arthralgias, back pain (acute on chronic), gait problem (using cane for ambulation) and myalgias.  Skin: Negative.   Neurological: Positive for weakness and numbness.  Psychiatric/Behavioral: Positive for behavioral problems (depressed mood, chronic back pain with additional pain from a recent fall.), dysphoric mood and sleep disturbance (due to acute and chronic back pain).    Physical Exam Vitals reviewed.  Constitutional:      General: He is not in acute distress.    Appearance: He is well-developed. He is morbidly obese. He is ill-appearing.  HENT:     Head: Normocephalic and atraumatic.  Cardiovascular:     Rate and Rhythm: Normal rate and regular rhythm.     Pulses: Normal pulses.     Heart sounds: Normal heart sounds.  Pulmonary:     Effort: Pulmonary effort is normal.     Breath sounds: Normal breath sounds.  Musculoskeletal:     Lumbar back: Spasms and tenderness present. Decreased range of motion.  Skin:    General: Skin is warm and dry.     Capillary Refill: Capillary refill takes less than 2 seconds.  Neurological:     Mental Status: He is alert and oriented to person, place, and time.     Gait: Gait abnormal (uses cane for ambulation).  Psychiatric:        Attention and Perception: Attention and perception normal.        Mood and Affect: Mood is depressed. Affect is tearful (due to pain).        Behavior: Behavior normal. Behavior is cooperative.        Thought Content: Thought content normal.        Cognition and Memory: Cognition and memory normal.        Judgment: Judgment normal.    Assessment/Plan: 1. Acute bilateral low back pain with bilateral sciatica Acute on chronic low back pain s/p fall at home on 5/21. Wants medication to relieve pain,  decline any narcotic pain medications due to family history of drug addiction. Recommended meloxicam to relieve pain and inflammation. Refer to neurology to assess possibility of spinal surgery or other solutions.  - meloxicam (MOBIC) 15 MG tablet; Take 1 tablet (15 mg total) by mouth daily.  Dispense: 30 tablet; Refill: 0 - Ambulatory referral to Neurology  -Ambulatory referral to orthopedic surgery Dr. 6/21 C. Yates spinal surgeon per patient request.   2. Numbness patient reports legs and feet are numb often especially before and while getting up to  a standing position, please see problem #1 for the plan.  - Ambulatory referral to Neurology   General Counseling: donel osowski understanding of the findings of todays visit and agrees with plan of treatment. I have discussed any further diagnostic evaluation that may be needed or ordered today. We also reviewed his medications today. he has been encouraged to call the office with any questions or concerns that should arise related to todays visit.    Counseling:    Orders Placed This Encounter  Procedures  . Ambulatory referral to Neurology  . Ambulatory referral to Orthopedic Surgery    Meds ordered this encounter  Medications  . meloxicam (MOBIC) 15 MG tablet    Sig: Take 1 tablet (15 mg total) by mouth daily.    Dispense:  30 tablet    Refill:  0   Return for F/U at next scheduled appointment , Chancie Lampert PCP.  [place] Controlled Substance Database was reviewed by me.  Time spent:20 Minutes  This patient was seen by Sallyanne Kuster, FNP-C in collaboration with Dr. Beverely Risen as a part of collaborative care agreement.  Sallyanne Kuster, MSN, FNP-C Internal Medicine

## 2021-02-11 ENCOUNTER — Ambulatory Visit: Payer: 59 | Admitting: Internal Medicine

## 2021-02-11 IMAGING — DX DG KNEE COMPLETE 4+V*R*
4 series · 4 of 4 positions shown · non-contrast
Comparison: None.

CLINICAL DATA: Right knee pain, fell

EXAM:
RIGHT KNEE - COMPLETE 4+ VIEW

[knee ap]
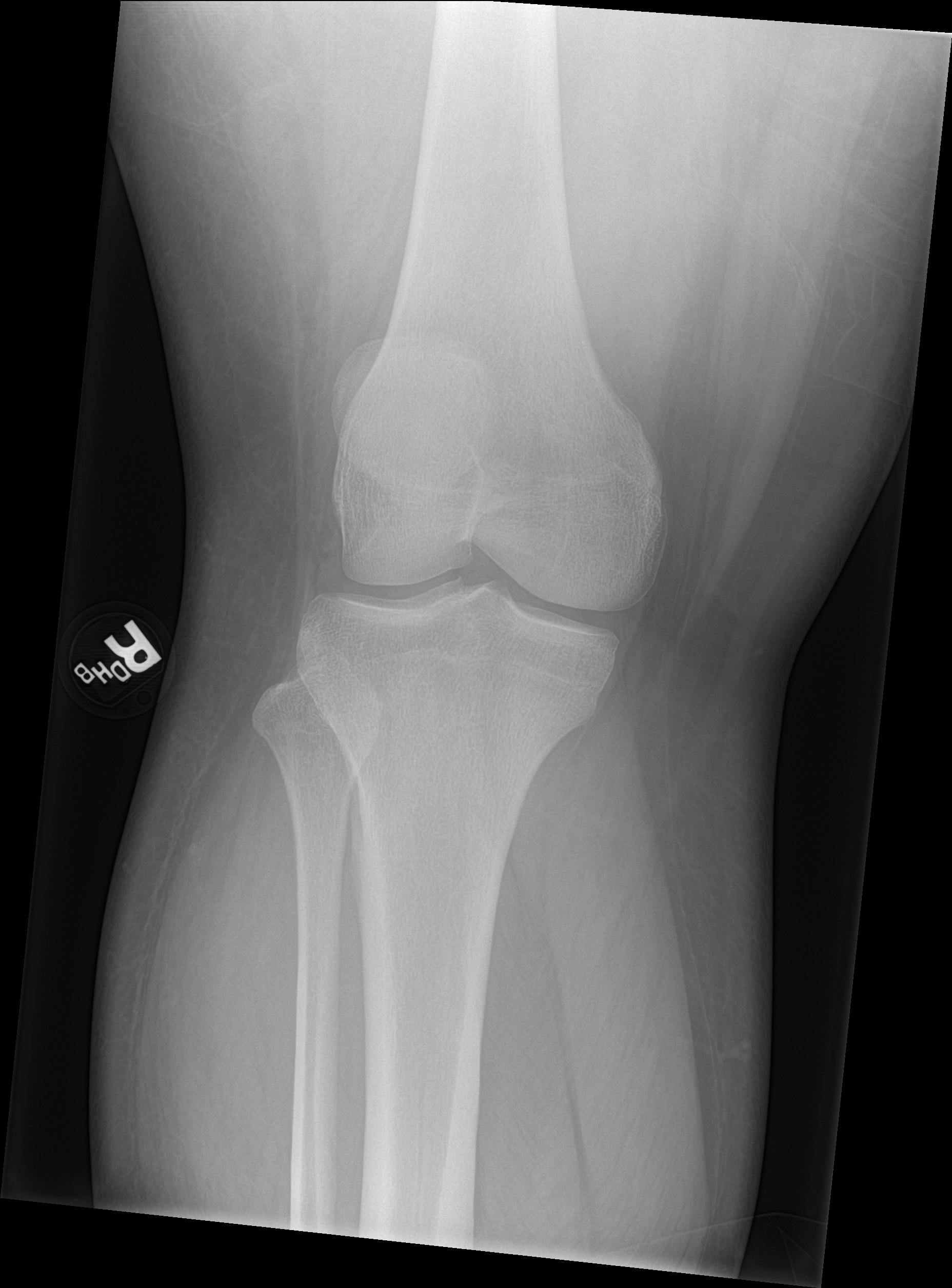

[knee lat]
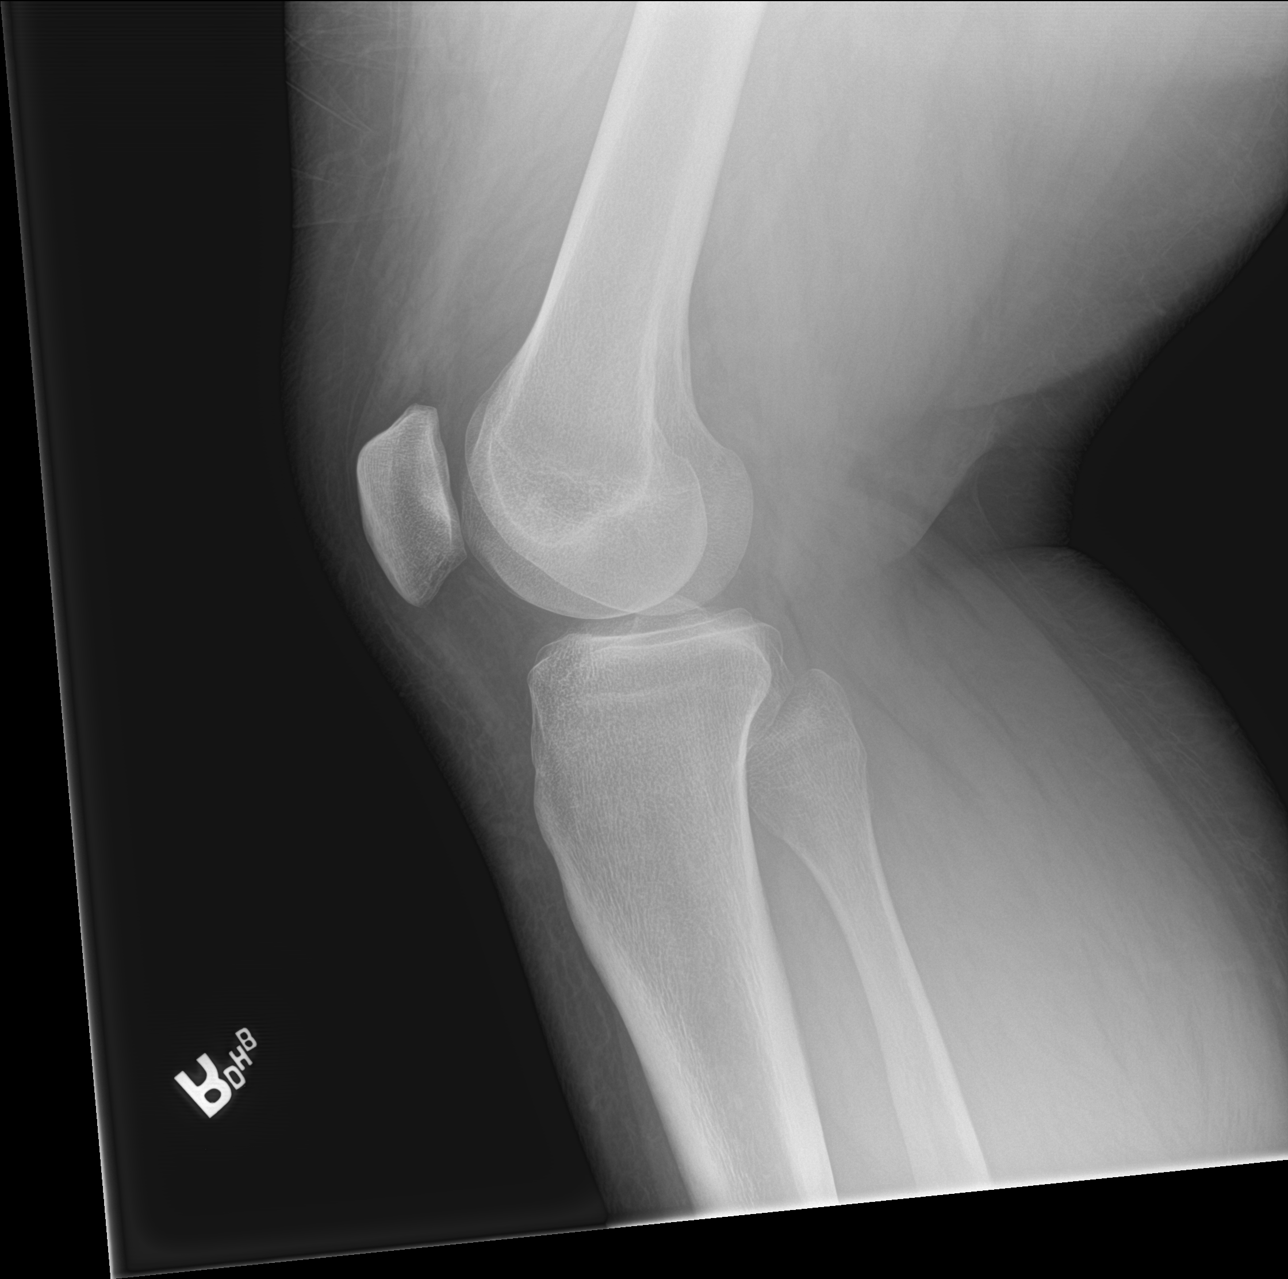

[knee obl (1 of 2)]
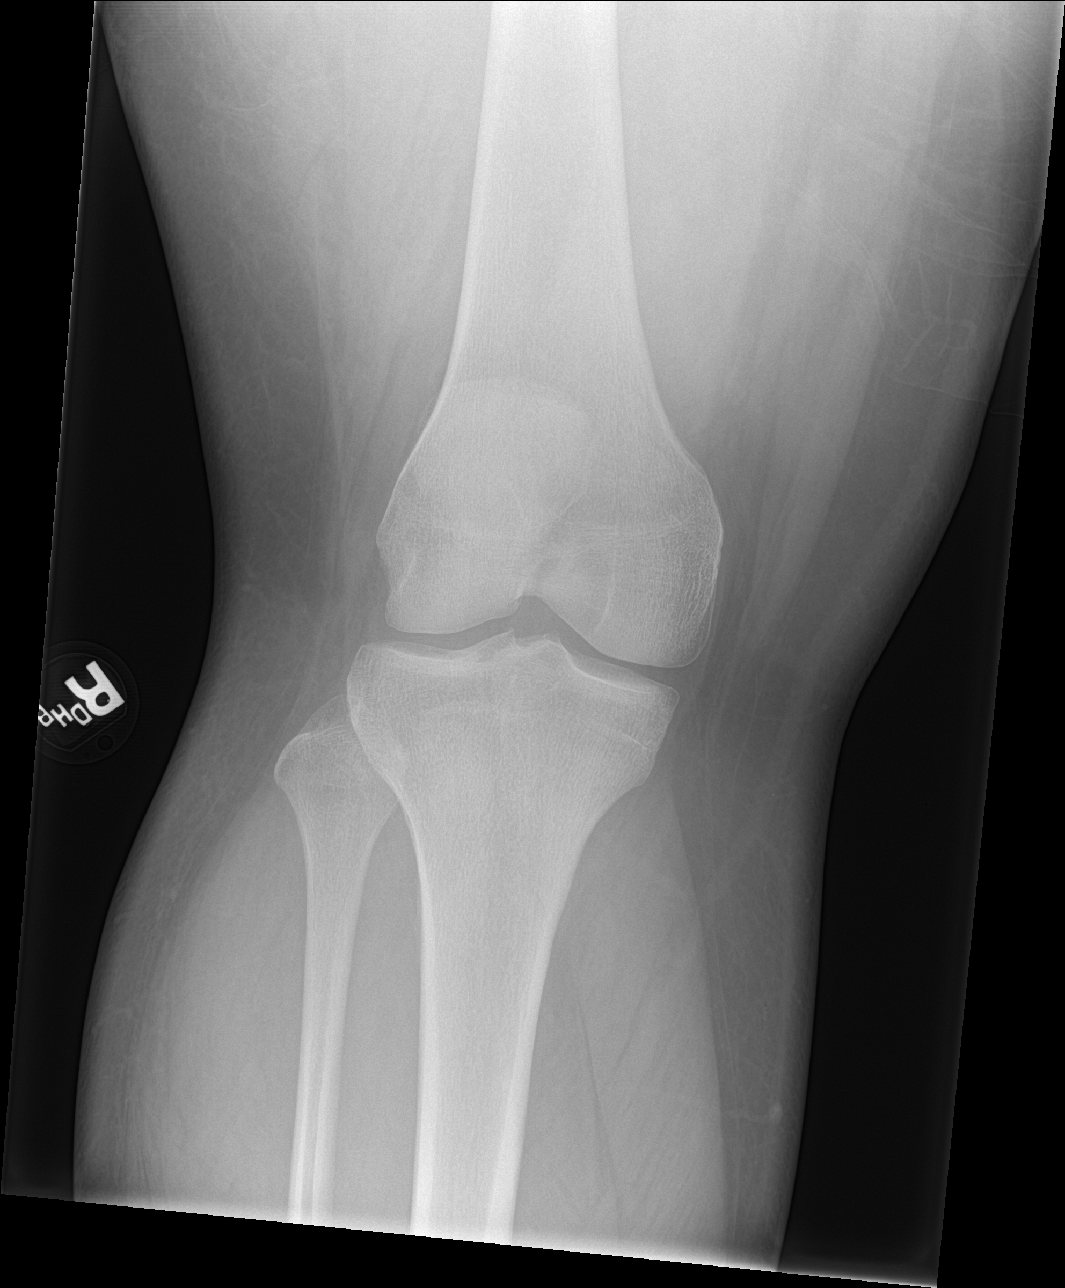

[knee obl (2 of 2)]
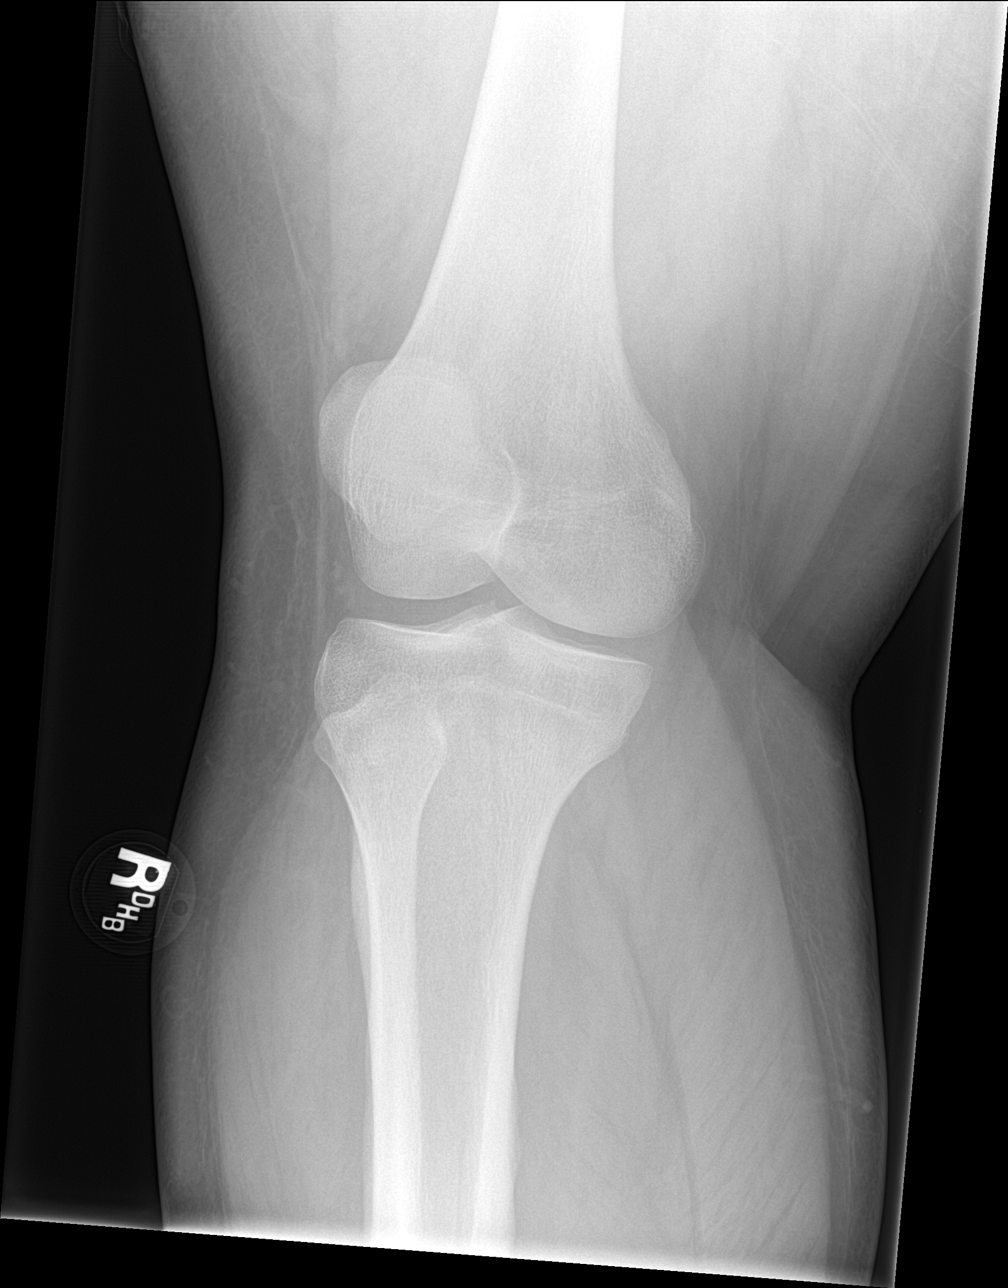

[4 of 4 positions shown; findings below may reference images not displayed]

FINDINGS: Frontal, bilateral oblique, and lateral views of the right knee are
obtained. No fracture, subluxation, or dislocation. Joint spaces are
well preserved. No joint effusion.
IMPRESSION: 1. Unremarkable right knee.

## 2021-02-12 ENCOUNTER — Ambulatory Visit: Payer: Self-pay

## 2021-02-12 ENCOUNTER — Encounter: Payer: Self-pay | Admitting: Orthopaedic Surgery

## 2021-02-12 ENCOUNTER — Ambulatory Visit (INDEPENDENT_AMBULATORY_CARE_PROVIDER_SITE_OTHER): Payer: 59 | Admitting: Orthopaedic Surgery

## 2021-02-12 VITALS — BP 115/77 | HR 91 | Ht 65.0 in | Wt 365.0 lb

## 2021-02-12 DIAGNOSIS — M542 Cervicalgia: Secondary | ICD-10-CM

## 2021-02-12 DIAGNOSIS — R638 Other symptoms and signs concerning food and fluid intake: Secondary | ICD-10-CM | POA: Diagnosis not present

## 2021-02-12 DIAGNOSIS — M545 Low back pain, unspecified: Secondary | ICD-10-CM | POA: Diagnosis not present
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
## 2021-02-28 ENCOUNTER — Other Ambulatory Visit: Payer: Self-pay | Admitting: Nurse Practitioner

## 2021-02-28 DIAGNOSIS — M5441 Lumbago with sciatica, right side: Secondary | ICD-10-CM

## 2021-02-28 DIAGNOSIS — M5442 Lumbago with sciatica, left side: Secondary | ICD-10-CM

## 2021-03-05 ENCOUNTER — Encounter: Payer: Self-pay | Admitting: Orthopaedic Surgery

## 2021-03-05 ENCOUNTER — Telehealth: Payer: Self-pay

## 2021-03-05 NOTE — Telephone Encounter (Signed)
Patient called regarding weight vs pain. Sent message to [employer]

## 2021-03-25 ENCOUNTER — Other Ambulatory Visit: Payer: Self-pay | Admitting: Internal Medicine

## 2021-04-08 ENCOUNTER — Other Ambulatory Visit: Payer: Self-pay | Admitting: Internal Medicine

## 2021-04-08 NOTE — Telephone Encounter (Signed)
Please schedule overdue F/U appointment. Thank you! ?

## 2021-04-08 NOTE — Telephone Encounter (Signed)
Attempted to schedule no ans no vm  

## 2021-05-11 ENCOUNTER — Other Ambulatory Visit: Payer: Self-pay

## 2021-05-11 ENCOUNTER — Encounter (HOSPITAL_COMMUNITY): Payer: Self-pay | Admitting: Emergency Medicine

## 2021-05-11 ENCOUNTER — Emergency Department (HOSPITAL_COMMUNITY): Payer: 59

## 2021-05-11 ENCOUNTER — Emergency Department (HOSPITAL_COMMUNITY)
Admission: EM | Admit: 2021-05-11 | Discharge: 2021-05-11 | Disposition: A | Discharge status: Home / Self Care | Payer: 59 | Attending: Emergency Medicine | Admitting: Emergency Medicine

## 2021-05-11 DIAGNOSIS — E1140 Type 2 diabetes mellitus with diabetic neuropathy, unspecified: Secondary | ICD-10-CM | POA: Diagnosis not present

## 2021-05-11 DIAGNOSIS — S99912A Unspecified injury of left ankle, initial encounter: Secondary | ICD-10-CM | POA: Diagnosis present

## 2021-05-11 DIAGNOSIS — S8262XA Displaced fracture of lateral malleolus of left fibula, initial encounter for closed fracture: Secondary | ICD-10-CM | POA: Insufficient documentation

## 2021-05-11 DIAGNOSIS — Z794 Long term (current) use of insulin: Secondary | ICD-10-CM | POA: Insufficient documentation

## 2021-05-11 DIAGNOSIS — J4551 Severe persistent asthma with (acute) exacerbation: Secondary | ICD-10-CM | POA: Insufficient documentation

## 2021-05-11 DIAGNOSIS — Z7951 Long term (current) use of inhaled steroids: Secondary | ICD-10-CM | POA: Diagnosis not present

## 2021-05-11 DIAGNOSIS — Z79899 Other long term (current) drug therapy: Secondary | ICD-10-CM | POA: Diagnosis not present

## 2021-05-11 DIAGNOSIS — S82892A Other fracture of left lower leg, initial encounter for closed fracture: Secondary | ICD-10-CM

## 2021-05-11 DIAGNOSIS — X501XXA Overexertion from prolonged static or awkward postures, initial encounter: Secondary | ICD-10-CM | POA: Insufficient documentation

## 2021-05-11 DIAGNOSIS — I10 Essential (primary) hypertension: Secondary | ICD-10-CM | POA: Insufficient documentation

## 2021-05-11 MED ORDER — ALBUTEROL SULFATE (5 MG/ML) 0.5% IN NEBU: 5.0000 mg | INHALATION_SOLUTION | Freq: Four times a day (QID) | RESPIRATORY_TRACT | 0 refills | Status: DC | PRN

## 2021-05-11 MED ORDER — HYDROCODONE-ACETAMINOPHEN 5-325 MG PO TABS
1.0000 | ORAL_TABLET | Freq: Once | ORAL | Status: AC
  Administered 2021-05-11: 1 via ORAL
  Filled 2021-05-11: qty 1

## 2021-05-11 NOTE — Discharge Instructions (Addendum)
Please read and follow all provided instructions.  Your diagnoses today include:  1. Closed avulsion fracture of left ankle, initial encounter    Tests performed today include: An x-ray of your ankle -shows a small avulsion fracture of your lateral ankle Vital signs. See below for your results today.   Medications prescribed:  Please use over-the-counter NSAID medications (ibuprofen, naproxen) as directed on the packaging for pain if you do not have any reasons not to take these medications just as weak kidneys or a history of bleeding in your stomach or gut.   Take any prescribed medications only as directed.  Home care instructions:  Follow any educational materials contained in this packet Follow R.I.C.E. Protocol: R - rest your injury  I  - use ice on injury without applying directly to skin C - compress injury with bandage or splint E - elevate the injury as much as possible  Follow-up instructions: Please follow-up with your primary care provider or the provided orthopedic (bone specialist) in 1 week.   Return instructions:  Please return if your toes are numb or tingling, appear gray or blue, or you have severe pain (also elevate leg and loosen splint or wrap) Please return to the Emergency Department if you experience worsening symptoms.  Please return if you have any other emergent concerns.  Additional Information:  Your vital signs today were: BP (!) 146/118   Pulse (!) 105   Temp 99 F (37.2 C) (Oral)   Resp 18   SpO2 96%  If your blood pressure (BP) was elevated above 135/85 this visit, please have this repeated by your doctor within one month. --------------

## 2021-05-11 NOTE — ED Notes (Signed)
Patient refused crutches.

## 2021-05-11 NOTE — ED Triage Notes (Signed)
Patient presents with left ankle pain after recently "twisting" his ankle.
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
## 2021-05-15 ENCOUNTER — Encounter: Payer: Self-pay | Admitting: Orthopaedic Surgery

## 2021-05-15 ENCOUNTER — Other Ambulatory Visit: Payer: Self-pay

## 2021-05-15 ENCOUNTER — Ambulatory Visit (INDEPENDENT_AMBULATORY_CARE_PROVIDER_SITE_OTHER): Payer: 59 | Admitting: Orthopaedic Surgery

## 2021-05-15 DIAGNOSIS — S8262XA Displaced fracture of lateral malleolus of left fibula, initial encounter for closed fracture: Secondary | ICD-10-CM
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
## 2021-05-17 ENCOUNTER — Ambulatory Visit (INDEPENDENT_AMBULATORY_CARE_PROVIDER_SITE_OTHER): Payer: 59 | Admitting: Family Medicine

## 2021-05-17 ENCOUNTER — Encounter (INDEPENDENT_AMBULATORY_CARE_PROVIDER_SITE_OTHER): Payer: Self-pay | Admitting: Family Medicine

## 2021-05-17 ENCOUNTER — Other Ambulatory Visit: Payer: Self-pay

## 2021-05-17 VITALS — BP 137/83 | HR 87 | Temp 98.0°F | Ht 66.0 in | Wt 359.0 lb

## 2021-05-17 DIAGNOSIS — E559 Vitamin D deficiency, unspecified: Secondary | ICD-10-CM | POA: Diagnosis not present

## 2021-05-17 DIAGNOSIS — E538 Deficiency of other specified B group vitamins: Secondary | ICD-10-CM

## 2021-05-17 DIAGNOSIS — R5383 Other fatigue: Secondary | ICD-10-CM | POA: Diagnosis not present

## 2021-05-17 DIAGNOSIS — R0602 Shortness of breath: Secondary | ICD-10-CM

## 2021-05-17 DIAGNOSIS — Z9189 Other specified personal risk factors, not elsewhere classified: Secondary | ICD-10-CM | POA: Diagnosis not present

## 2021-05-17 DIAGNOSIS — Z1331 Encounter for screening for depression: Secondary | ICD-10-CM

## 2021-05-17 DIAGNOSIS — E1169 Type 2 diabetes mellitus with other specified complication: Secondary | ICD-10-CM | POA: Diagnosis not present

## 2021-05-17 DIAGNOSIS — G4733 Obstructive sleep apnea (adult) (pediatric): Secondary | ICD-10-CM

## 2021-05-17 DIAGNOSIS — Z6841 Body Mass Index (BMI) 40.0 and over, adult: Secondary | ICD-10-CM

## 2021-05-17 DIAGNOSIS — Z0289 Encounter for other administrative examinations: Secondary | ICD-10-CM

## 2021-05-17 DIAGNOSIS — I152 Hypertension secondary to endocrine disorders: Secondary | ICD-10-CM

## 2021-05-17 DIAGNOSIS — E1159 Type 2 diabetes mellitus with other circulatory complications: Secondary | ICD-10-CM

## 2021-05-17 DIAGNOSIS — F319 Bipolar disorder, unspecified: Secondary | ICD-10-CM

## 2021-05-17 NOTE — Progress Notes (Signed)
Chief Complaint:   OBESITY VORIS TIGERT (MR# [id number]) is a 35 y.o. male who presents for evaluation and treatment of obesity and related comorbidities. Current BMI is Body mass index is 57.94 kg/m. Javar has been struggling with his weight for many years and has been unsuccessful in either losing weight, maintaining weight loss, or reaching his healthy weight goal.  Lizandro is trying to get on disability for Bipolar, COPD, back pain, etc. His wife Jeanice Lim and father in-law live at home with him. He was sent our clinic by his Orthopedist, who told the patient that he needs to lose weight due to multiple issues. Darious got medical insurance in January, his primary care physician was at [facility] in [place], [place]. He hasn't been there since 12/2020. He says he has no money to eat healthy, no money to be seen by doctors, and no money for medications.  Hampton is currently in the action stage of change and ready to dedicate time achieving and maintaining a healthier weight. Jostin is interested in becoming our patient and working on intensive lifestyle modifications including (but not limited to) diet and exercise for weight loss.  Damek's habits were reviewed today and are as follows: His family eats meals together, he thinks his family will eat healthier with him, his desired weight loss is 109 lbs, he has been heavy most of his life, he started gaining weight in 3rd grade when he was given steroids, his heaviest weight ever was 390 pounds, he has significant food cravings issues, he snacks frequently in the evenings, he skips meals frequently, he is frequently drinking liquids with calories, he frequently makes poor food choices, he has problems with excessive hunger, he frequently eats larger portions than normal, and he struggles with emotional eating.  Depression Screen Asaiah's Food and Mood (modified PHQ-9) score was 21.  Depression screen PHQ 2/9 05/17/2021  Decreased  Interest 3  Down, Depressed, Hopeless 3  PHQ - 2 Score 6  Altered sleeping 3  Tired, decreased energy 3  Change in appetite 3  Feeling bad or failure about yourself  0  Trouble concentrating 3  Moving slowly or fidgety/restless 3  Suicidal thoughts 0  PHQ-9 Score 21  Difficult doing work/chores Extremely dIfficult   Subjective:   1. Other fatigue Caedyn admits to daytime somnolence and admits to waking up still tired. Patent has a history of symptoms of daytime fatigue, morning fatigue, and morning headache. Stafford generally gets  5 to 8  hours of sleep per night, and states that he has nightime awakenings. Snoring is present. Apneic episodes are present. Epworth Sleepiness Score is 21. Pleasant has severe COPD and asthma. He is not on medications, and he has poorly controlled obstructive sleep apnea as well.  2. Shortness of breath on exertion Jesaiah notes increasing shortness of breath with exercising and seems to be worsening over time with weight gain. He notes getting out of breath sooner with activity than he used to. This has not gotten worse recently. Yordin denies shortness of breath at rest or orthopnea.  3. Hypertension associated with type 2 diabetes mellitus (HCC) Amro is on lisinopril, but he is out of refill on his medicines.  4. Type 2 diabetes mellitus with other specified complication, without long-term current use of insulin (HCC) Dashan's A1c 4 months ago was 6.5.  5. Bipolar affective disorder, remission status unspecified (HCC) Fue has severe anxiety and depression. His PHQ-9 score is 21. He is not on  medications currently. He was previously treated by his primary care physician. He was seen by a Psychologist in the past, in his teens only. He denies suicidal ideations.  6. Vitamin D deficiency Molli HazardMatthew is Vitamin D and B12 deficient.   7. B12 nutritional deficiency Molli HazardMatthew is Vitamin B12 and Vitamin D deficient.   8. OSA (obstructive sleep  apnea) Lawerence's Epworth Sleepiness Score is 21. He uses his CPAP nightly. He notes it is a new machine and he is having some issues with it.  9. At risk for impaired metabolic function Molli HazardMatthew is at increased risk for impaired metabolic function due to current nutrition and muscle mass.  Assessment/Plan:   Orders Placed This Encounter  Procedures   Vitamin B12   Lipid Panel With LDL/HDL Ratio   T4, free   TSH   VITAMIN D 25 Hydroxy (Vit-D Deficiency, Fractures)   CBC with Differential/Platelet   Comprehensive metabolic panel   Folate   Hemoglobin A1c   Insulin, random   Specimen status report   EKG 12-Lead    There are no discontinued medications.   No orders of the defined types were placed in this encounter.    1. Other fatigue Molli HazardMatthew does feel that his weight is causing his energy to be lower than it should be. Fatigue may be related to obesity, depression or many other causes. Labs will be ordered, and in the meanwhile, Molli HazardMatthew will focus on self care including making healthy food choices, increasing physical activity and focusing on stress reduction.  - EKG 12-Lead - Lipid Panel With LDL/HDL Ratio - T4, free - TSH - VITAMIN D 25 Hydroxy (Vit-D Deficiency, Fractures) - CBC with Differential/Platelet - Folate  2. Shortness of breath on exertion Molli HazardMatthew does feel that he gets out of breath more easily that he used to when he exercises. Johncarlos's shortness of breath appears to be obesity related and exercise induced. He has agreed to work on weight loss and gradually increase exercise to treat his exercise induced shortness of breath. Will continue to monitor closely.  3. Hypertension associated with type 2 diabetes mellitus (HCC) We will check labs today. Param's blood pressure is at goal today.  - Counseled Leamon ArntMatthew J Perleberg on pathophysiology of disease and discussed treatment plan, which always includes dietary and lifestyle modification as first line.  -  Lifestyle changes such as following our low salt, heart healthy meal plan and engaging in a regular exercise program discussed  - Avoid buying foods that are: processed, frozen, or prepackaged to avoid excess salt. - Ambulatory blood pressure monitoring encouraged.  Reminded patient that if they ever feel poorly in any way, to check their blood pressure and pulse as well. - We will continue to monitor closely alongside PCP/ specialists.  Pt reminded to also f/up with those individuals as instructed by them.  - We will continue to monitor symptoms as they relate to the his weight loss journey.  4. Type 2 diabetes mellitus with other specified complication, without long-term current use of insulin (HCC) We will check labs today. Molli HazardMatthew will follow his prudent nutritional plan, and will work on weight loss.  - Counseled patient on pathophysiology of disease and discussed good blood sugar control is important to decrease the likelihood of diabetic complications such as nephropathy, neuropathy, limb loss, blindness, coronary artery disease, and death.  Intensive lifestyle modification including diet, exercise and weight loss are the first line of treatment for diabetes.    - Continue home blood sugar monitoring  regularly - closely- especially with continued wt loss.  Reviewed blood sugar goals.  Reminded if pt feels poorly- check BS and BP at that time.  Bring in BS and BP logs each OV. - Eat on a regular basis- no skipping or going long periods without eating.  We discussed hypoglycemia prevention. - Any concerns about medicines should be directed at the prescribing provider - Recheck labs in 3 months if not done at Endo/ PCP.  - Importance of f/up with PCP and all other specialists as scheduled was stressed to pt today - Pt should contact their endocrinologist or PCP as they see fit with any Q's/ concerns with what we discuss.   - It is recommended for pt to continue with current plan and medication  doses at this time  ( please adjust verbage if I change treatment- thnx!)   - Comprehensive metabolic panel - Hemoglobin A1c - Insulin, random  5. Bipolar affective disorder, remission status unspecified (HCC) I recommended Philander to follow up with his primary care physician as soon as possible for management even though he feels in control at this current time.  6. Vitamin D deficiency We will check labs today, and Kaelin will work on improving his dietary habits.  - Discussed importance of vitamin D to their health and well-being.  - possible symptoms of low Vitamin D can be low energy, depressed mood, muscle aches, joint aches, osteoporosis etc. - low Vitamin D levels may be linked to an increased risk of cardiovascular events and even increased risk of cancers- such as colon and breast.  - Informed patient this may be a lifelong thing, and he was encouraged to continue to take the medicine until told otherwise.   - we will need to monitor levels regularly (every 3-4 mo on average) to keep levels within normal limits.  - weight loss will likely improve availability of vitamin D, thus encouraged Daegon to continue with meal plan and their weight loss efforts to further improve this condition - pt's questions and concerns regarding this condition addressed.  - VITAMIN D 25 Hydroxy (Vit-D Deficiency, Fractures)  7. B12 nutritional deficiency The diagnosis was reviewed with the patient. Counseling provided today, see below. We will continue to monitor. We will check labs today, and Aldrick will work on improving his dietary habits. Orders and follow up as documented in patient record.  Counseling The body needs vitamin B12: to make red blood cells; to make DNA; and to help the nerves work properly so they can carry messages from the brain to the body.  The main causes of vitamin B12 deficiency include dietary deficiency, digestive diseases, pernicious anemia, and having a surgery in  which part of the stomach or small intestine is removed.  Certain medicines can make it harder for the body to absorb vitamin B12. These medicines include: heartburn medications; some antibiotics; some medications used to treat diabetes, gout, and high cholesterol.  In some cases, there are no symptoms of this condition. If the condition leads to anemia or nerve damage, various symptoms can occur, such as weakness or fatigue, shortness of breath, and numbness or tingling in your hands and feet.   Treatment:  May include taking vitamin B12 supplements.  Avoid alcohol.  Eat lots of healthy foods that contain vitamin B12: Beef, pork, chicken, [place], and organ meats, such as liver.  Seafood: This includes clams, rainbow trout, salmon, tuna, and haddock. Eggs.  Cereal and dairy products that are fortified: This means that vitamin  B12 has been added to the food.   - Vitamin B12  8. OSA (obstructive sleep apnea) Intensive lifestyle modifications are the first line treatment for this issue. We discussed several lifestyle modifications today. I recommended the patient to follow up with sleep medicine doctor/team for assistance with his CPAP machine and settings. Education was done today. Alexande will work on diet, exercise and weight loss efforts. We will continue to monitor. Orders and follow up as documented in patient record.   9. Depression screening Bertil had a positive depression screening. Depression is commonly associated with obesity and often results in emotional eating behaviors. We will monitor this closely and work on CBT to help improve the non-hunger eating patterns. Referral to Psychology may be required if no improvement is seen as he continues in our clinic.  10. At risk for impaired metabolic function Due to Marina's current state of health and medical condition(s), he is at a significantly higher risk for impaired metabolic function.   At least 23 minutes was spent on counseling  Snyder about these concerns today.  This places the patient at a much greater risk to subsequently develop cardio-pulmonary conditions that can negatively affect the patient's quality of life.  I stressed the importance of reversing these risks factors.  The initial goal is to lose at least 5-10% of starting weight to help reduce risk factors.  Counseling:  Intensive lifestyle modifications discussed with Ladislav as the most appropriate first line treatment.  he will continue to work on diet, exercise, and weight loss efforts.  We will continue to reassess these conditions on a fairly regular basis in an attempt to decrease the patient's overall morbidity and mortality.  11. Class 3 severe obesity with serious comorbidity and body mass index (BMI) of 50.0 to 59.9 in adult, unspecified obesity type [facility]) Jayko is currently in the action stage of change and his goal is to continue with weight loss efforts. I recommend Tan begin the structured treatment plan as follows:  He has agreed to practicing portion control and making smarter food choices, such as increasing vegetables and decreasing simple carbohydrates.  Exercise goals: As is.   Behavioral modification strategies: decreasing simple carbohydrates, keeping healthy foods in the home, avoiding temptations, and planning for success.  He was informed of the importance of frequent follow-up visits to maximize his success with intensive lifestyle modifications for his multiple health conditions. He was informed we would discuss his lab results at his next visit unless there is a critical issue that needs to be addressed sooner. Marrell agreed to keep his next visit at the agreed upon time to discuss these results.  Objective:   Blood pressure 137/83, pulse 87, temperature 98 F (36.7 C), height 5\' 6"  (1.676 m), weight (!) 359 lb (162.8 kg), SpO2 98 %. Body mass index is 57.94 kg/m.  EKG: Normal sinus rhythm, rate (unable to  obtain).  Indirect Calorimeter completed today shows a VO2 of 380 and a REE of 2621.  His calculated basal metabolic rate is thus his basal metabolic rate is worse than expected.  General: Cooperative, alert, well developed, in no acute distress. HEENT: Conjunctivae and lids unremarkable. Cardiovascular: Regular rhythm.  Lungs: Normal work of breathing. Neurologic: No focal deficits.   Lab Results  Component Value Date   CREATININE 0.60 (L) 11/28/2020   BUN 18 11/28/2020   NA 137 11/28/2020   K 3.9 11/28/2020   CL 104 11/28/2020   CO2 25 11/28/2020   Lab Results  Component Value Date   ALT 26 09/26/2019   AST 17 09/26/2019   ALKPHOS 98 09/26/2019   BILITOT 0.4 09/26/2019   Lab Results  Component Value Date   HGBA1C 6.5 (A) 12/26/2020   HGBA1C 6.9 (A) 10/16/2020   HGBA1C 5.4 09/26/2019   No results found for: INSULIN Lab Results  Component Value Date   TSH 1.650 11/28/2020   Lab Results  Component Value Date   CHOL 161 09/26/2019   HDL 52 09/26/2019   LDLCALC 90 09/26/2019   TRIG 106 09/26/2019   CHOLHDL 3.1 09/26/2019   Lab Results  Component Value Date   WBC 11.1 (H) 11/28/2020   HGB 13.2 11/28/2020   HCT 39.5 11/28/2020   MCV 81.4 11/28/2020   PLT 323 11/28/2020   No results found for: IRON, TIBC, FERRITIN  Attestation Statements:   Reviewed by clinician on day of visit: allergies, medications, problem list, medical history, surgical history, family history, social history, and previous encounter notes.   Trude Mcburney, am acting as transcriptionist for [employer], DO.  I have reviewed the above documentation for accuracy and completeness, and I agree with the above. Carlye Grippe, D.O.  The 21st Century Cures Act was signed into law in 2016 which includes the topic of electronic health records.  This provides immediate access to information in MyChart.  This includes consultation notes, operative notes, office notes, lab results  and pathology reports.  If you have any questions about what you read please let us know at your next visit so we can discuss your concerns and take corrective action if need be.  We are right here with you.

## 2021-05-21 LAB — VITAMIN D 25 HYDROXY (VIT D DEFICIENCY, FRACTURES): Vit D, 25-Hydroxy: 26.1 ng/mL — ABNORMAL LOW (ref 30.0–100.0)

## 2021-05-21 LAB — COMPREHENSIVE METABOLIC PANEL
ALT: 25 IU/L (ref 0–44)
AST: 18 IU/L (ref 0–40)
Albumin/Globulin Ratio: 2 (ref 1.2–2.2)
Albumin: 4.7 g/dL (ref 4.0–5.0)
Alkaline Phosphatase: 94 IU/L (ref 44–121)
BUN/Creatinine Ratio: 17 (ref 9–20)
BUN: 12 mg/dL (ref 6–20)
Bilirubin Total: 0.4 mg/dL (ref 0.0–1.2)
CO2: 20 mmol/L (ref 20–29)
Calcium: 9.4 mg/dL (ref 8.7–10.2)
Chloride: 100 mmol/L (ref 96–106)
Creatinine, Ser: 0.71 mg/dL — ABNORMAL LOW (ref 0.76–1.27)
Globulin, Total: 2.4 g/dL (ref 1.5–4.5)
Glucose: 149 mg/dL — ABNORMAL HIGH (ref 65–99)
Potassium: 4.6 mmol/L (ref 3.5–5.2)
Sodium: 137 mmol/L (ref 134–144)
Total Protein: 7.1 g/dL (ref 6.0–8.5)
eGFR: 121 mL/min/{1.73_m2} (ref >59)

## 2021-05-21 LAB — FOLATE: Folate: 5.9 ng/mL (ref >3.0)

## 2021-05-21 LAB — CBC WITH DIFFERENTIAL/PLATELET

## 2021-05-21 LAB — T4, FREE: Free T4: 1.22 ng/dL (ref 0.82–1.77)

## 2021-05-21 LAB — TSH: TSH: 1.57 u[IU]/mL (ref 0.450–4.500)

## 2021-05-21 LAB — LIPID PANEL WITH LDL/HDL RATIO
Cholesterol, Total: 171 mg/dL (ref 100–199)
HDL: 41 mg/dL (ref >39)
LDL Chol Calc (NIH): 107 mg/dL — ABNORMAL HIGH (ref 0–99)
LDL/HDL Ratio: 2.6 ratio (ref 0.0–3.6)
Triglycerides: 125 mg/dL (ref 0–149)
VLDL Cholesterol Cal: 23 mg/dL (ref 5–40)

## 2021-05-21 LAB — HEMOGLOBIN A1C
Est. average glucose Bld gHb Est-mCnc: 154 mg/dL
Hgb A1c MFr Bld: 7 % — ABNORMAL HIGH (ref 4.8–5.6)

## 2021-05-21 LAB — VITAMIN B12: Vitamin B-12: 437 pg/mL (ref 232–1245)

## 2021-05-21 LAB — INSULIN, RANDOM: INSULIN: 23.4 u[IU]/mL (ref 2.6–24.9)

## 2021-05-21 LAB — SPECIMEN STATUS REPORT

## 2021-05-31 ENCOUNTER — Ambulatory Visit (INDEPENDENT_AMBULATORY_CARE_PROVIDER_SITE_OTHER): Payer: 59 | Admitting: Family Medicine

## 2021-05-31 ENCOUNTER — Other Ambulatory Visit: Payer: Self-pay

## 2021-05-31 ENCOUNTER — Encounter (INDEPENDENT_AMBULATORY_CARE_PROVIDER_SITE_OTHER): Payer: Self-pay | Admitting: Family Medicine

## 2021-05-31 VITALS — BP 131/81 | HR 81 | Temp 97.6°F | Ht 66.0 in | Wt 351.0 lb

## 2021-05-31 DIAGNOSIS — I152 Hypertension secondary to endocrine disorders: Secondary | ICD-10-CM

## 2021-05-31 DIAGNOSIS — E1169 Type 2 diabetes mellitus with other specified complication: Secondary | ICD-10-CM | POA: Diagnosis not present

## 2021-05-31 DIAGNOSIS — Z9189 Other specified personal risk factors, not elsewhere classified: Secondary | ICD-10-CM

## 2021-05-31 DIAGNOSIS — E785 Hyperlipidemia, unspecified: Secondary | ICD-10-CM

## 2021-05-31 DIAGNOSIS — E1159 Type 2 diabetes mellitus with other circulatory complications: Secondary | ICD-10-CM

## 2021-05-31 DIAGNOSIS — Z6841 Body Mass Index (BMI) 40.0 and over, adult: Secondary | ICD-10-CM

## 2021-05-31 DIAGNOSIS — E538 Deficiency of other specified B group vitamins: Secondary | ICD-10-CM | POA: Diagnosis not present

## 2021-05-31 DIAGNOSIS — E559 Vitamin D deficiency, unspecified: Secondary | ICD-10-CM | POA: Diagnosis not present

## 2021-05-31 MED ORDER — METFORMIN HCL 500 MG PO TABS: ORAL_TABLET | ORAL | 0 refills | Status: DC

## 2021-05-31 MED ORDER — VITAMIN D (ERGOCALCIFEROL) 1.25 MG (50000 UNIT) PO CAPS: 50000.0000 [IU] | ORAL_CAPSULE | ORAL | 0 refills | Status: DC

## 2021-06-03 NOTE — Progress Notes (Signed)
Chief Complaint:   OBESITY Christian Barrett is here to discuss his progress with his obesity treatment plan along with follow-up of his obesity related diagnoses. Christian Barrett is on the Category 3 Plan and practicing portion control and making smarter food choices, such as increasing vegetables and decreasing simple carbohydrates and states he is following his eating plan approximately 50% of the time. Rizwan states he is not currently exercising.  Today's visit was #: 2 Starting weight: 359 lbs Starting date: 05/17/2021 Today's weight: 351 lbs Today's date: 05/31/2021 Total lbs lost to date: 8 Total lbs lost since last in-office visit: 8  Interim History: Christian Barrett is here today for his first follow-up office visit since starting the program with [place].  All blood work/ lab tests that were recently ordered by myself or an outside provider were reviewed with patient today per their request.   Extended time was spent counseling him on all new disease processes that were discovered or preexisting ones that are affected by BMI.  he understands that many of these abnormalities will need to monitored regularly along with the current treatment plan of prudent dietary changes, in which we are making each and every office visit, to improve these health parameters. Christian Barrett only followed PC/[place] for a week. He had 3-4 slices of [place], ham, or roast beef 4 times a day with no bread or other foods.   Subjective:   1. Type 2 diabetes mellitus with other specified complication, without long-term current use of insulin (HCC) Discussed labs with patient today. [name]'s A1c is 7.0 and fasting insulin is 23.4. He is not checking blood sugars. Pt has an appointment with PCP next month. He is not taking any medication currently.   2. Hypertension associated with type 2 diabetes mellitus (HCC) Discussed labs with patient today. At goal. Medication: Lisinopril  BP Readings from Last 3 Encounters:  05/31/21 131/81   05/17/21 137/83  05/11/21 (!) 144/95   Lab Results  Component Value Date   CREATININE 0.71 (L) 05/20/2021   CREATININE 0.60 (L) 11/28/2020   CREATININE 0.69 08/28/2020   3. Vitamin D deficiency Worsening. Discussed labs with patient today. He is currently taking no vitamin D supplement. He denies nausea, vomiting or muscle weakness.  Lab Results  Component Value Date   VD25OH 26.1 (L) 05/20/2021   4. Vitamin B12 deficiency Discussed labs with patient today. At goal. Medication: None    Lab Results  Component Value Date   VITAMINB12 437 05/20/2021   5. Hyperlipidemia associated with type 2 diabetes mellitus (HCC) Discussed labs with patient today. LDL not at goal. Christian Barrett has hyperlipidemia and has been trying to improve his cholesterol levels with intensive lifestyle modification including a low saturated fat diet, exercise and weight loss. He denies any chest pain, claudication or myalgias.  Lab Results  Component Value Date   ALT 25 05/20/2021   AST 18 05/20/2021   ALKPHOS 94 05/20/2021   BILITOT 0.4 05/20/2021   Lab Results  Component Value Date   CHOL 171 05/20/2021   HDL 41 05/20/2021   LDLCALC 107 (H) 05/20/2021   TRIG 125 05/20/2021   CHOLHDL 3.1 09/26/2019   6. At risk for heart disease Christian Barrett is at a higher than average risk for cardiovascular disease due to diabetes mellitus, hyperlipidemia, and hypertension.   Assessment/Plan:  No orders of the defined types were placed in this encounter.   Medications Discontinued During This Encounter  Medication Reason   albuterol (PROVENTIL) (5 MG/ML)  0.5% nebulizer solution Error   diltiazem (DILACOR XR) 240 MG 24 hr capsule Error   Dulaglutide (TRULICITY) 0.75 MG/0.5ML SOPN Error   furosemide (LASIX) 40 MG tablet Error   gabapentin (NEURONTIN) 300 MG capsule Error   Ibuprofen-Acetaminophen (ADVIL DUAL ACTION) 125-250 MG TABS Error   ipratropium-albuterol (DUONEB) 0.5-2.5 (3) MG/3ML SOLN Error   meloxicam  (MOBIC) 15 MG tablet Error   metFORMIN (GLUCOPHAGE) 500 MG tablet Error   methylPREDNISolone (MEDROL DOSEPAK) 4 MG TBPK tablet Error   montelukast (SINGULAIR) 10 MG tablet Error   nortriptyline (PAMELOR) 10 MG capsule Error   QUEtiapine (SEROQUEL) 25 MG tablet Error     Meds ordered this encounter  Medications   Vitamin D, Ergocalciferol, (DRISDOL) 1.25 MG (50000 UNIT) CAPS capsule    Sig: Take 1 capsule (50,000 Units total) by mouth every 7 (seven) days.    Dispense:  4 capsule    Refill:  0    30 d supply;  ** OV for RF **   Do not send RF request   DISCONTD: metFORMIN (GLUCOPHAGE) 500 MG tablet    Sig: 1 po with lunch qd    Dispense:  30 tablet    Refill:  0    30 d supply;  ** OV for RF **   Do not send RF request   metFORMIN (GLUCOPHAGE) 500 MG tablet    Sig: 1 po with lunch qd    Dispense:  30 tablet    Refill:  0    30 d supply;  ** OV for RF **   Do not send RF request     1. Type 2 diabetes mellitus with other specified complication, without long-term current use of insulin (HCC) Good blood sugar control is important to decrease the likelihood of diabetic complications such as nephropathy, neuropathy, limb loss, blindness, coronary artery disease, and death. Intensive lifestyle modification including diet, exercise and weight loss are the first line of treatment for diabetes.  Follow up with PCP regarding medication management beyond ours today. Blood sugar management per PCP. Risks and benefits of medication discussed with pt and handout provided.   Start- metFORMIN (GLUCOPHAGE) 500 MG tablet; 1 po with lunch qd  Dispense: 30 tablet; Refill: 0  2. Hypertension associated with type 2 diabetes mellitus (HCC) [name] is working on healthy weight loss and exercise to improve blood pressure control. We will watch for signs of hypotension as he continues his lifestyle modifications. Continue current treatment plan.  3. Vitamin D deficiency Plan: - Discussed importance of  vitamin D to their health and well-being.  - possible symptoms of low Vitamin D can be low energy, depressed mood, muscle aches, joint aches, osteoporosis etc. - low Vitamin D levels may be linked to an increased risk of cardiovascular events and even increased risk of cancers- such as colon and breast.  - I recommend pt take a 50,000 IU weekly prescription vit D - see script below   - Informed patient this may be a lifelong thing, and he was encouraged to continue to take the medicine until told otherwise.   - we will need to monitor levels regularly (every 3-4 mo on average) to keep levels within normal limits.  - weight loss will likely improve availability of vitamin D, thus encouraged Rushawn to continue with meal plan and their weight loss efforts to further improve this condition - pt's questions and concerns regarding this condition addressed.  Start- Vitamin D, Ergocalciferol, (DRISDOL) 1.25 MG (50000 UNIT) CAPS  capsule; Take 1 capsule (50,000 Units total) by mouth every 7 (seven) days.  Dispense: 4 capsule; Refill: 0  4. Vitamin B12 deficiency The diagnosis was reviewed with the patient. Counseling provided today, see below. We will continue to monitor. Orders and follow up as documented in patient record.  Counseling The body needs vitamin B12: to make red blood cells; to make DNA; and to help the nerves work properly so they can carry messages from the brain to the body.  The main causes of vitamin B12 deficiency include dietary deficiency, digestive diseases, pernicious anemia, and having a surgery in which part of the stomach or small intestine is removed.  Certain medicines can make it harder for the body to absorb vitamin B12. These medicines include: heartburn medications; some antibiotics; some medications used to treat diabetes, gout, and high cholesterol.  In some cases, there are no symptoms of this condition. If the condition leads to anemia or nerve damage, various symptoms  can occur, such as weakness or fatigue, shortness of breath, and numbness or tingling in your hands and feet.   Treatment:  May include taking vitamin B12 supplements.  Avoid alcohol.  Eat lots of healthy foods that contain vitamin B12: Beef, pork, chicken, [place], and organ meats, such as liver.  Seafood: This includes clams, rainbow trout, salmon, tuna, and haddock. Eggs.  Cereal and dairy products that are fortified: This means that vitamin B12 has been added to the food.   5. Hyperlipidemia associated with type 2 diabetes mellitus (HCC) Cardiovascular risk and specific lipid/LDL goals reviewed.  We discussed several lifestyle modifications today and Tara will continue to work on diet, exercise and weight loss efforts. Orders and follow up as documented in patient record.  Follow up with PCP for medication management. Follow prudent nutritional plan to improve levels by decreasing saturated and trans fats and weight loss. Counseling provided.  Counseling Intensive lifestyle modifications are the first line treatment for this issue. Dietary changes: Increase soluble fiber. Decrease simple carbohydrates. Exercise changes: Moderate to vigorous-intensity aerobic activity 150 minutes per week if tolerated. Lipid-lowering medications: see documented in medical record.  6. At risk for heart disease Danner was given approximately 23 minutes of coronary artery disease prevention counseling today. He is 35 y.o. male and has risk factors for heart disease including obesity. We discussed intensive lifestyle modifications today with an emphasis on specific weight loss instructions and strategies.   Repetitive spaced learning was employed today to elicit superior memory formation and behavioral change.  7. Obesity with current BMI of 56.7  Trysten is currently in the action stage of change. As such, his goal is to continue with weight loss efforts. He has agreed to keeping a food journal and  change to adhering to recommended goals of 2000-2100 calories and 120+ grams protein and practicing portion control and making smarter food choices, such as increasing vegetables and decreasing simple carbohydrates but journal foods and add 40+ grams of  fiber.  Discussed with pt importance of following recommendation on [facility]/[place] plan. For future, pt will journal his intake to ensure sufficient intake.  Exercise goals:  As is  Behavioral modification strategies: increasing lean protein intake, decreasing simple carbohydrates, increasing water intake, and planning for success.  Reginold has agreed to follow-up with our clinic in 2 weeks. He was informed of the importance of frequent follow-up visits to maximize his success with intensive lifestyle modifications for his multiple health conditions.   Objective:   Blood pressure 131/81, pulse 81,  temperature 97.6 F (36.4 C), height 5\' 6"  (1.676 m), weight (!) 351 lb (159.2 kg), SpO2 97 %. Body mass index is 56.65 kg/m.  General: Cooperative, alert, well developed, in no acute distress. HEENT: Conjunctivae and lids unremarkable. Cardiovascular: Regular rhythm.  Lungs: Normal work of breathing. Neurologic: No focal deficits.   Lab Results  Component Value Date   CREATININE 0.71 (L) 05/20/2021   BUN 12 05/20/2021   NA 137 05/20/2021   K 4.6 05/20/2021   CL 100 05/20/2021   CO2 20 05/20/2021   Lab Results  Component Value Date   ALT 25 05/20/2021   AST 18 05/20/2021   ALKPHOS 94 05/20/2021   BILITOT 0.4 05/20/2021   Lab Results  Component Value Date   HGBA1C 7.0 (H) 05/20/2021   HGBA1C 6.5 (A) 12/26/2020   HGBA1C 6.9 (A) 10/16/2020   HGBA1C 5.4 09/26/2019   Lab Results  Component Value Date   INSULIN 23.4 05/20/2021   Lab Results  Component Value Date   TSH 1.570 05/20/2021   Lab Results  Component Value Date   CHOL 171 05/20/2021   HDL 41 05/20/2021   LDLCALC 107 (H) 05/20/2021   TRIG 125 05/20/2021   CHOLHDL 3.1  09/26/2019   Lab Results  Component Value Date   VD25OH 26.1 (L) 05/20/2021   Lab Results  Component Value Date   WBC CANCELED 05/20/2021   HGB CANCELED 05/20/2021   HCT CANCELED 05/20/2021   MCV CANCELED 05/20/2021   PLT CANCELED 05/20/2021    Attestation Statements:   Reviewed by clinician on day of visit: allergies, medications, problem list, medical history, surgical history, family history, social history, and previous encounter notes.  07/20/2021, CMA, am acting as transcriptionist for Edmund Hilda, DO.  I have reviewed the above documentation for accuracy and completeness, and I agree with the above. [employer], D.O.  The 21st Century Cures Act was signed into law in 2016 which includes the topic of electronic health records.  This provides immediate access to information in MyChart.  This includes consultation notes, operative notes, office notes, lab results and pathology reports.  If you have any questions about what you read please let 2017 know at your next visit so we can discuss your concerns and take corrective action if need be.  We are right here with you.

## 2021-06-10 ENCOUNTER — Other Ambulatory Visit: Payer: Self-pay

## 2021-06-10 ENCOUNTER — Ambulatory Visit (INDEPENDENT_AMBULATORY_CARE_PROVIDER_SITE_OTHER): Payer: 59 | Admitting: Bariatrics

## 2021-06-10 ENCOUNTER — Encounter (INDEPENDENT_AMBULATORY_CARE_PROVIDER_SITE_OTHER): Payer: Self-pay | Admitting: Bariatrics

## 2021-06-10 VITALS — BP 126/70 | HR 82 | Temp 98.4°F | Ht 66.0 in | Wt 349.0 lb

## 2021-06-10 DIAGNOSIS — R42 Dizziness and giddiness: Secondary | ICD-10-CM | POA: Diagnosis not present

## 2021-06-10 DIAGNOSIS — Z6841 Body Mass Index (BMI) 40.0 and over, adult: Secondary | ICD-10-CM

## 2021-06-10 DIAGNOSIS — I152 Hypertension secondary to endocrine disorders: Secondary | ICD-10-CM

## 2021-06-10 DIAGNOSIS — E1169 Type 2 diabetes mellitus with other specified complication: Secondary | ICD-10-CM

## 2021-06-10 DIAGNOSIS — E1159 Type 2 diabetes mellitus with other circulatory complications: Secondary | ICD-10-CM | POA: Diagnosis not present

## 2021-06-10 DIAGNOSIS — I5032 Chronic diastolic (congestive) heart failure: Secondary | ICD-10-CM | POA: Insufficient documentation

## 2021-06-10 MED ORDER — ONDANSETRON HCL 4 MG PO TABS: 4.0000 mg | ORAL_TABLET | Freq: Three times a day (TID) | ORAL | 0 refills | Status: AC | PRN

## 2021-06-10 NOTE — Progress Notes (Signed)
Chief Complaint:   OBESITY Christian Barrett is here to discuss his progress with his obesity treatment plan along with follow-up of his obesity related diagnoses. Christian Barrett is on the Category 3 Plan and states he is following his eating plan approximately 100% of the time. Christian Barrett states he is doing 0 minutes 0 times per week.  Today's visit was #: 3 Starting weight: 359 lbs Starting date: 05/17/2021 Today's weight: 349 lbs Today's date: 06/10/2021 Total lbs lost to date: 10 lbs Total lbs lost since last in-office visit: 2 lbs  Interim History: Christian Barrett is down another 2 lbs from his last visit. He is getting about 100 mL of water per day. He has been skipping meals and not feeling that well overall.  Subjective:   1. Type 2 diabetes mellitus with other specified complication, without long-term current use of insulin (HCC) Christian Barrett is currently taking Metformin.  2. Hypertension associated with type 2 diabetes mellitus (HCC) Christian Barrett is currently taking Lisinopril. His blood pressure is controlled.   3. Vertigo Christian Barrett declines going to the ER or urgent care at this time. Vertigo may be due to loss of electrolytes, dietary changes and too much water with loss of electrolytes.   Assessment/Plan:   1. Type 2 diabetes mellitus with other specified complication, without long-term current use of insulin (HCC) Good blood sugar control is important to decrease the likelihood of diabetic complications such as nephropathy, neuropathy, limb loss, blindness, coronary artery disease, and death. [name] will continue medications. Intensive lifestyle modification including diet, exercise and weight loss are the first line of treatment for diabetes.   2. Hypertension associated with type 2 diabetes mellitus (HCC) Ritter will continue medications. He is working on healthy weight loss and exercise to improve blood pressure control. We will watch for signs of hypotension as he continues his lifestyle  modifications.  3. Vertigo Christian Barrett will begin 1-2 Powerade zero or G-2, G zero. We will refill Zofran 4 mg for nausea or vomiting with no refills. He will go to the Urgent Care or ER if symptoms worse or persist. discussed with Kentley's wife Christian Barrett) signs of stroke and any worsening signs and symptoms, and she voices understanding that he is to  go to the Urgent Care or ER or call 911 if needed.   Ondansetron (ZOFRAN) 4 MG tablet; Take 1 tablet (4 mg total) by mouth every 8 (eight) hours as needed for nausea or vomiting.  Dispense: 20 tablet; Refill: 0  4. Obesity with current BMI of 56.4 Christian Barrett is currently in the action stage of change. As such, his goal is to continue with weight loss efforts. He has agreed to the Category 3 Plan.   Christian Barrett will continue meal planning and intentional eating. He will increase protein.   Exercise goals: No exercise has been prescribed at this time.  Behavioral modification strategies: increasing lean protein intake, decreasing simple carbohydrates, increasing vegetables, increasing water intake, decreasing eating out, no skipping meals, meal planning and cooking strategies, keeping healthy foods in the home, and planning for success.  Christian Barrett has agreed to follow-up with our clinic in 2 weeks. He was informed of the importance of frequent follow-up visits to maximize his success with intensive lifestyle modifications for his multiple health conditions.   Objective:   Blood pressure 126/70, pulse 82, temperature 98.4 F (36.9 C), height 5\' 6"  (1.676 m), weight (!) 349 lb (158.3 kg), SpO2 98 %. Body mass index is 56.33 kg/m.  General: Cooperative, alert, well developed, in  no acute distress. HEENT: Conjunctivae and lids unremarkable. Cardiovascular: Regular rhythm.  Lungs: Normal work of breathing. Neurologic: No focal deficits.   Lab Results  Component Value Date   CREATININE 0.71 (L) 05/20/2021   BUN 12 05/20/2021   NA 137 05/20/2021   K 4.6  05/20/2021   CL 100 05/20/2021   CO2 20 05/20/2021   Lab Results  Component Value Date   ALT 25 05/20/2021   AST 18 05/20/2021   ALKPHOS 94 05/20/2021   BILITOT 0.4 05/20/2021   Lab Results  Component Value Date   HGBA1C 7.0 (H) 05/20/2021   HGBA1C 6.5 (A) 12/26/2020   HGBA1C 6.9 (A) 10/16/2020   HGBA1C 5.4 09/26/2019   Lab Results  Component Value Date   INSULIN 23.4 05/20/2021   Lab Results  Component Value Date   TSH 1.570 05/20/2021   Lab Results  Component Value Date   CHOL 171 05/20/2021   HDL 41 05/20/2021   LDLCALC 107 (H) 05/20/2021   TRIG 125 05/20/2021   CHOLHDL 3.1 09/26/2019   Lab Results  Component Value Date   VD25OH 26.1 (L) 05/20/2021   Lab Results  Component Value Date   WBC CANCELED 05/20/2021   HGB CANCELED 05/20/2021   HCT CANCELED 05/20/2021   MCV CANCELED 05/20/2021   PLT CANCELED 05/20/2021   No results found for: IRON, TIBC, FERRITIN  Attestation Statements:   Reviewed by clinician on day of visit: allergies, medications, problem list, medical history, surgical history, family history, social history, and previous encounter notes.  I, Jackson Latino, RMA, am acting as Energy manager for [employer], DO.   I have reviewed the above documentation for accuracy and completeness, and I agree with the above. Corinna Capra, DO

## 2021-06-11 ENCOUNTER — Encounter (INDEPENDENT_AMBULATORY_CARE_PROVIDER_SITE_OTHER): Payer: Self-pay | Admitting: Bariatrics

## 2021-06-12 ENCOUNTER — Ambulatory Visit: Payer: 59 | Admitting: Orthopaedic Surgery

## 2021-06-17 ENCOUNTER — Ambulatory Visit (INDEPENDENT_AMBULATORY_CARE_PROVIDER_SITE_OTHER): Payer: 59 | Admitting: Family Medicine

## 2021-06-17 ENCOUNTER — Encounter: Payer: Self-pay | Admitting: Family Medicine

## 2021-06-17 ENCOUNTER — Other Ambulatory Visit: Payer: Self-pay

## 2021-06-17 VITALS — BP 148/92 | HR 102 | Ht 66.0 in | Wt 357.8 lb

## 2021-06-17 DIAGNOSIS — R000 Tachycardia, unspecified: Secondary | ICD-10-CM

## 2021-06-17 DIAGNOSIS — E1159 Type 2 diabetes mellitus with other circulatory complications: Secondary | ICD-10-CM | POA: Diagnosis not present

## 2021-06-17 DIAGNOSIS — E1169 Type 2 diabetes mellitus with other specified complication: Secondary | ICD-10-CM | POA: Diagnosis not present

## 2021-06-17 DIAGNOSIS — G4733 Obstructive sleep apnea (adult) (pediatric): Secondary | ICD-10-CM | POA: Diagnosis not present

## 2021-06-17 DIAGNOSIS — J4551 Severe persistent asthma with (acute) exacerbation: Secondary | ICD-10-CM

## 2021-06-17 DIAGNOSIS — E1140 Type 2 diabetes mellitus with diabetic neuropathy, unspecified: Secondary | ICD-10-CM

## 2021-06-17 DIAGNOSIS — Z9989 Dependence on other enabling machines and devices: Secondary | ICD-10-CM

## 2021-06-17 DIAGNOSIS — Z6841 Body Mass Index (BMI) 40.0 and over, adult: Secondary | ICD-10-CM

## 2021-06-17 DIAGNOSIS — F419 Anxiety disorder, unspecified: Secondary | ICD-10-CM

## 2021-06-17 DIAGNOSIS — E785 Hyperlipidemia, unspecified: Secondary | ICD-10-CM

## 2021-06-17 DIAGNOSIS — R079 Chest pain, unspecified: Secondary | ICD-10-CM

## 2021-06-17 DIAGNOSIS — I152 Hypertension secondary to endocrine disorders: Secondary | ICD-10-CM

## 2021-06-17 MED ORDER — ALBUTEROL SULFATE HFA 108 (90 BASE) MCG/ACT IN AERS: 2.0000 | INHALATION_SPRAY | Freq: Four times a day (QID) | RESPIRATORY_TRACT | 2 refills | Status: AC | PRN

## 2021-06-17 MED ORDER — METFORMIN HCL ER 500 MG PO TB24: 500.0000 mg | ORAL_TABLET | Freq: Every day | ORAL | 3 refills | Status: AC

## 2021-06-17 MED ORDER — ESCITALOPRAM OXALATE 10 MG PO TABS: 10.0000 mg | ORAL_TABLET | Freq: Every day | ORAL | 3 refills | Status: AC

## 2021-06-17 NOTE — Patient Instructions (Addendum)
It was a pleasure meeting you today.  I am sorry you are having all of these issues regarding your care.  For your breathing I have sent a prescription for albuterol to your pharmacy, let me know if you have any issues.  Regarding your diabetes I sent a refill of the metformin extended release medication to your pharmacy.  I also sent a prescription for Lexapro to your pharmacy for your anxiety.  Please continue to work on getting established with a psychiatrist.  Regarding all of your chronic health issues I am going to reach out to our referral person about how to find the proper place to send the referrals to.  It may be that she just need new referrals to these providers.  I think you need to see pulmonology for your sleep apnea to get your CPAP properly calibrated.  I also think you need to see cardiology again.  I would like to see you back in 2-3 weeks which will give [place] time to get your records from your previous provider.  If you have any issues between now and then please call the clinic.  I hope you have a wonderful afternoon!

## 2021-06-17 NOTE — Progress Notes (Signed)
SUBJECTIVE:   CHIEF COMPLAINT / HPI:   Establish Care   Current concerns: Asthma issues, dizziness,  PMH  Allergies-1987 Anxiety-1990, on multiple medications but does not remember which ones.  Knows that he was on escitalopram Arthritis-2000 Asthma-1990 Chronic pain-2000, takes 1200 mg ibuprofen daily for this Diabetes-2020 Acid reflux and stomach ulcers-1995 Hypertension-1995 Fatty liver disease Migraines-19 9 Sleep apnea-2010, on CPAP but reports that he keeps getting red :-( is every morning when he wakes up.  PSH Microdiscectomy and 2003 Bone spur removal of the shoulder in 2017  Allergies  Lamictal and tramadol  Family HX Alcohol and drug abuse in his mother, father, sisters, grandparents and aunts and uncles Alzheimer's in his grandparents Asthma-onset uncles Cancer-mother with lung cancer an aunt with breast cancer Heart disease-mother, grandparents, aunts and uncles Depression and anxiety in mother, father, sisters, brother, grandparents, aunts and uncles.  He reports that his sister may have overdosed, all of his maternal uncles committed suicide. Diabetes-mother, father, sister, grandparents and aunts and uncles Early death-mother had lung cancer, sister-possible overdose, uncles-suicide Hyperlipidemia-mother, father, grandparents Hypertension-mother, father, grandparents Osteoporosis-months Stroke-grandmother "had a stroke when she was 56 and went blind" Alcohol abuse in mother, father, sister, grandparents, aunts and uncles Drug abuse in mother, sister, aunts and uncles  Social HX Patient lives in [place] and is currently unemployed.  He is filing for disability and has had difficulty with this.  He has had multiple falls and many health issues.  Denies recreational drug use, tobacco use, alcohol use.  Enjoys [employer] and gaming.    OBJECTIVE:   BP (!) 148/92   Pulse (!) 102   Ht 5\' 6"  (1.676 m)   Wt (!) 357 lb 12.8 oz (162.3 kg)   SpO2  98%   BMI 57.75 kg/m   General: Pleasant 35 year old male, obese, no acute distress HEENT: Pupils equal and reactive to light, extraocular eye movement intact with no nystagmus Cardiac: Regular rate and rhythm, no murmurs appreciated Respiratory: Increased work of breathing especially on exertion, lungs are clear to auscultation MSK: Tenderness to palpation of left ankle as well as right knee.  No erythema, edema appreciated Neuro: Cranial nerves grossly intact, Dix-Hallpike maneuver negative  ASSESSMENT/PLAN:   Health maintenance Patient has an extremely complex past medical history with multiple comorbidities.  We will closely follow and slowly address each of his issues.  Type 2 diabetes mellitus with diabetic neuropathy, without long-term current use of insulin (HCC) Most recent hemoglobin A1c was 7.0.  Currently taking 500 mg metformin daily.  Reports that it causes stomach issues.  Will transition to 500 mg XR and plan on titration as needed.  Consider injectable medication for weight loss and diabetes such as Ozempic.  We will discuss further at next visit.  Anxiety, mild Patient is going to continue to work with telemedicine to get his psychiatric medications but has not yet established with them.  The only medication he remembers that he was on was escitalopram.  Refill sent for this medication.  We will follow-up on this in 2 weeks.  OSA on CPAP Patient has diagnosed OSA.  He reports that his CPAP machine keeps giving him red :-(  when he wakes up in the morning.  He knows that he needs to see his pulmonologist but reports that his pulmonologist no longer accepts his insurance.  I have placed order for care coordination to help figure out where to place referrals for cardiology as well as pulmonology.  Severe persistent  asthma with exacerbation Patient reports that his shortness of breath is terrible with exertion but also worse at rest.  He has a nebulizer but it is broken.  He  also used his wife's nebulizer and broke it as well as his mother's.  He reports that he does not have any albuterol inhalers.  Inhaler sent to patient's pharmacy.     Derrel Nip, MD [facility]

## 2021-06-18 NOTE — Assessment & Plan Note (Signed)
Most recent hemoglobin A1c was 7.0.  Currently taking 500 mg metformin daily.  Reports that it causes stomach issues.  Will transition to 500 mg XR and plan on titration as needed.  Consider injectable medication for weight loss and diabetes such as Ozempic.  We will discuss further at next visit.

## 2021-06-18 NOTE — Assessment & Plan Note (Signed)
Patient has diagnosed OSA.  He reports that his CPAP machine keeps giving him red :-(  when he wakes up in the morning.  He knows that he needs to see his pulmonologist but reports that his pulmonologist no longer accepts his insurance.  I have placed order for care coordination to help figure out where to place referrals for cardiology as well as pulmonology.

## 2021-06-18 NOTE — Assessment & Plan Note (Signed)
Patient reports that his shortness of breath is terrible with exertion but also worse at rest.  He has a nebulizer but it is broken.  He also used his wife's nebulizer and broke it as well as his mother's.  He reports that he does not have any albuterol inhalers.  Inhaler sent to patient's pharmacy.

## 2021-06-18 NOTE — Assessment & Plan Note (Signed)
Patient is going to continue to work with telemedicine to get his psychiatric medications but has not yet established with them.  The only medication he remembers that he was on was escitalopram.  Refill sent for this medication.  We will follow-up on this in 2 weeks.

## 2021-06-20 ENCOUNTER — Telehealth: Payer: Self-pay | Admitting: *Deleted

## 2021-06-20 NOTE — Chronic Care Management (AMB) (Signed)
  Care Management   Note  06/20/2021 Name: DAVYN MORANDI MRN: [id number] DOB: [birth year]  THORNE WIRZ is a 35 y.o. year old male who is a primary care patient of Derrel Nip, MD. I reached out to [name] by phone today in response to a referral sent by Mr. CHADEN DOOM primary care provider.   Mr. Pola was given information about care management services today including:  Care management services include personalized support from designated clinical staff supervised by his physician, including individualized plan of care and coordination with other care providers 24/7 contact phone numbers for assistance for urgent and routine care needs. The patient may stop care management services at any time by phone call to the office staff.  Patient agreed to services and verbal consent obtained.   Follow up plan: Telephone appointment with care management team member scheduled HKU:VJDY 06/24/21 and Social Worker 07/01/21  Gwenevere Ghazi  Care Guide, Embedded Care Coordination [facility]  Care Management  Direct Dial: [phone]

## 2021-06-20 NOTE — Addendum Note (Signed)
Addended by: Celedonio Savage on: 06/20/2021 05:31 PM   Modules accepted: Orders

## 2021-06-24 ENCOUNTER — Ambulatory Visit: Payer: 59
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
## 2021-06-24 NOTE — Patient Instructions (Signed)
Mr. Bungert  it was nice speaking with you. Please call me directly [phone] if you have questions about the goals we discussed.   Goals Addressed               This Visit's Progress     I need a Specialist.cpg (pt-stated)        Timeframe:  Short-Term Goal Priority:  High Start Date:  06/24/21                           Expected End Date: 08/07/21                       Patient Goals/Self-Care Activities: Patient will self administer medications as prescribed as evidenced by self report/primary caregiver report  Patient will attend all scheduled provider appointments as evidenced by clinician review of documented attendance to scheduled appointments and patient/caregiver report Patient will call pharmacy for medication refills as evidenced by patient report and review of pharmacy fill history as appropriate Patient will call provider office for new concerns or questions as evidenced by review of documented incoming telephone call notes and patient report Patient will stay involved with the referral process        Patient Care Plan: RN Case Manager     Problem Identified: Quality of Life (General Plan of Care) for insurance      Goal: The patient will be proactive in learning information with his health team regading his insurance  to maintain his Quality of Life   Start Date: 06/24/2021  Expected End Date: 08/07/2021  Priority: High  Note:   Current Barriers:  Care Coordination needs related to Level of care concerns regarding insurance and finding Health providers  RNCM Clinical Goal(s):  Patient will verbalize understanding of plan for management of finding specialist with his insurance through collaboration with RN Care manager, provider, and care team.   Interventions: 1:1 collaboration with primary care provider regarding development and update of comprehensive plan of care as evidenced by provider attestation and co-signature Inter-disciplinary care team  collaboration (see longitudinal plan of care) Evaluation of current treatment plan related to  self management and patient's adherence to plan as established by provider   Insurance 06/24/21  (Status: New goal.) Evaluation of current treatment plan related to  insurance and  , Limited access to speciality caregiver self-management and patient's adherence to plan as established by provider. Discussed plans with patient for ongoing care management follow up and provided patient with direct contact information for care management team Reviewed medications with patient and discussed  Discussed plans with patient for ongoing care management follow up and provided patient with direct contact information for care management team; Called the patient's insurance with him and then looked up the information on line. Reviewed fall precautions Information sent to referral coordinator via outlook 06/24/21: I spoke with Mr. Thivierge today and introduced myself and the services. Dr. Dione Housekeeper referred the patient due to his insurance [employer], and he has been unable to find a specialist for Pulmonary and Cardiology. I spoke with the patient at length today to learn information about him and his needs. We made a conference call to the insurance company, were on hold for an extended period, and had to hang up. I was able to go online and look up some offices in [place] for Pulmonology and Cardiology. I sent the information to the referral coordinator via outlook. I informed the  patient that I would follow up with him in two weeks to see how everything was going with his referrals.  Patient Goals/Self-Care Activities: Patient will self administer medications as prescribed as evidenced by self report/primary caregiver report  Patient will attend all scheduled provider appointments as evidenced by clinician review of documented attendance to scheduled appointments and patient/caregiver report Patient will call  pharmacy for medication refills as evidenced by patient report and review of pharmacy fill history as appropriate Patient will call provider office for new concerns or questions as evidenced by review of documented incoming telephone call notes and patient report Patient will stay involved with the referral process        Mr. Neale Burly received Care Management services today:  Care Management services include personalized support from designated clinical staff supervised by his physician, including individualized plan of care and coordination with other care providers 24/7 contact [phone] for assistance for urgent and routine care needs. Care Management are voluntary services and be declined at any time by calling the office.  The patient verbalized understanding of instructions provided today and agreed to receive a mailed copy of patient instruction and/or educational materials.   Follow Up Plan: Patient would like continued follow-up.  CCM RNCM will outreach the patient within the next 2 weeks.  Patient will call office if needed prior to next encounter   Juanell Fairly, RN   [phone]

## 2021-06-26 ENCOUNTER — Ambulatory Visit: Payer: 59 | Admitting: Pharmacist

## 2021-06-26 ENCOUNTER — Other Ambulatory Visit (INDEPENDENT_AMBULATORY_CARE_PROVIDER_SITE_OTHER): Payer: Self-pay | Admitting: Family Medicine

## 2021-06-26 DIAGNOSIS — E1169 Type 2 diabetes mellitus with other specified complication: Secondary | ICD-10-CM

## 2021-07-01 ENCOUNTER — Telehealth: Payer: 59

## 2021-07-01 ENCOUNTER — Telehealth: Payer: Self-pay | Admitting: Licensed Clinical Social Worker

## 2021-07-01 NOTE — Chronic Care Management (AMB) (Signed)
    Clinical Social Work  Care Management   Phone Outreach    07/01/2021 Name: HELEN CUFF MRN: [id number] DOB: [birth year]  NYKEEM CITRO is a 35 y.o. year old male who is a primary care patient of Derrel Nip, MD .   Reason for referral: Mental Health Counseling and Resources.    CCM LCSW reached out to patient today by phone to introduce self, assess needs and offer Care Management services and interventions.    Telephone outreach was unsuccessful.  Left two  HIPPA compliant phone message was left for the patient providing contact information and requesting a return call.   Plan:CCM LCSW will wait for return call. If no return call is received, Will route chart to Care Guide to see if patient would like to reschedule phone appointment   Review of patient status, including review of consultants reports, relevant laboratory and other test results, and collaboration with appropriate care team members and the patient's provider was performed as part of comprehensive patient evaluation and provision of care management services.     [name], LCSW Care Management & Coordination  [facility] [employer]   [phone]

## 2021-07-03 ENCOUNTER — Ambulatory Visit: Payer: 59 | Admitting: Family Medicine

## 2021-07-08 ENCOUNTER — Ambulatory Visit: Payer: 59

## 2021-07-08 ENCOUNTER — Telehealth (INDEPENDENT_AMBULATORY_CARE_PROVIDER_SITE_OTHER): Payer: 59 | Admitting: Bariatrics

## 2021-07-08 DIAGNOSIS — Z6841 Body Mass Index (BMI) 40.0 and over, adult: Secondary | ICD-10-CM | POA: Diagnosis not present

## 2021-07-08 DIAGNOSIS — E559 Vitamin D deficiency, unspecified: Secondary | ICD-10-CM

## 2021-07-08 DIAGNOSIS — G4733 Obstructive sleep apnea (adult) (pediatric): Secondary | ICD-10-CM

## 2021-07-08 MED ORDER — VITAMIN D (ERGOCALCIFEROL) 1.25 MG (50000 UNIT) PO CAPS: 50000.0000 [IU] | ORAL_CAPSULE | ORAL | 0 refills | Status: DC
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
## 2021-07-08 NOTE — Patient Instructions (Signed)
Visit Information  [name]  it was nice speaking with you. Please call me directly [phone] if you have questions about the goals we discussed.  Patient Goals/Self-Care Activities: Patient will self administer medications as prescribed as evidenced by self report/primary caregiver report  Patient will attend all scheduled provider appointments as evidenced by clinician review of documented attendance to scheduled appointments and patient/caregiver report Patient will call pharmacy for medication refills as evidenced by patient report and review of pharmacy fill history as appropriate Patient will call provider office for new concerns or questions as evidenced by review of documented incoming telephone call notes and patient report Patient will follow up with offices    The patient verbalized understanding of instructions, educational materials, and care plan provided today and declined offer to receive copy of patient instructions, educational materials, and care plan.   Follow up Plan: Patient would like continued follow-up.  CCM RNCM will outreach the patient within the next 3 weeks.  Patient will call office if needed prior to next encounter  Christian Fairly, RN  [phone]

## 2021-07-09 NOTE — Progress Notes (Signed)
TeleHealth Visit:  Due to the COVID-19 pandemic, this visit was completed with telemedicine (audio/video) technology to reduce patient and provider exposure as well as to preserve personal protective equipment.   Christian Barrett has verbally consented to this TeleHealth visit. The patient is located at home, the provider is located at the [facility] office. The participants in this visit include the listed provider and patient. The visit was conducted today via video.  Chief Complaint: OBESITY Christian Barrett is here to discuss his progress with his obesity treatment plan along with follow-up of his obesity related diagnoses. Christian Barrett is on the Category 3 Plan and states he is following his eating plan approximately 40% of the time. Christian Barrett states he is walking more 0 minutes 0 times per week.  Today's visit was #: 4 Starting weight: 359 lbs Starting date: 05/17/2021  Interim History: Christian Barrett states that he has Covid and requested a MyChart visit. He thinks that he may have gained weight. He is getting fluids and protein.   Subjective:   1. Vitamin D deficiency Christian Barrett is taking his medications as directed.  2. OSA (obstructive sleep apnea) Christian Barrett is using a CPAP and getting restful sleep.  Assessment/Plan:   1. Vitamin D deficiency Low Vitamin D level contributes to fatigue and are associated with obesity, breast, and colon cancer. We will refill prescription Vitamin D 50,000 IU every week for 1 month with no refills and Christian Barrett will follow-up for routine testing of Vitamin D, at least 2-3 times per year to avoid over-replacement.  - Vitamin D, Ergocalciferol, (DRISDOL) 1.25 MG (50000 UNIT) CAPS capsule; Take 1 capsule (50,000 Units total) by mouth every 7 (seven) days.  Dispense: 4 capsule; Refill: 0  2. OSA (obstructive sleep apnea) Intensive lifestyle modifications are the first line treatment for this issue. We discussed several lifestyle modifications today and he will  continue to work on diet, exercise and weight loss efforts. Christian Barrett will continue using CPAP. We will continue to monitor. Orders and follow up as documented in patient record.    3. Class 3 severe obesity with serious comorbidity and body mass index (BMI) of 50.0 to 59.9 in adult, unspecified obesity type Christian Barrett) Christian Barrett is currently in the action stage of change. As such, his goal is to continue with weight loss efforts. He has agreed to the Category 3 Plan.   Christian Barrett will increase water, raw vegetables, yogurt, low calories and soups.   Exercise goals:  Christian Barrett will walk more.  Behavioral modification strategies: increasing lean protein intake, decreasing simple carbohydrates, increasing vegetables, increasing water intake, decreasing eating out, no skipping meals, meal planning and cooking strategies, keeping healthy foods in the home, and planning for success.  Christian Barrett has agreed to follow-up with our clinic in 2 weeks with Dr. Raliegh Scarlet.  He was informed of the importance of frequent follow-up visits to maximize his success with intensive lifestyle modifications for his multiple health conditions.  Objective:   VITALS: Per patient if applicable, see vitals. GENERAL: Alert and in no acute distress. CARDIOPULMONARY: No increased WOB. Speaking in clear sentences.  PSYCH: Pleasant and cooperative. Speech normal rate and rhythm. Affect is appropriate. Insight and judgement are appropriate. Attention is focused, linear, and appropriate.  NEURO: Oriented as arrived to appointment on time with no prompting.   Lab Results  Component Value Date   CREATININE 0.71 (L) 05/20/2021   BUN 12 05/20/2021   NA 137 05/20/2021   K 4.6 05/20/2021   CL 100 05/20/2021  CO2 20 05/20/2021   Lab Results  Component Value Date   ALT 25 05/20/2021   AST 18 05/20/2021   ALKPHOS 94 05/20/2021   BILITOT 0.4 05/20/2021   Lab Results  Component Value Date   HGBA1C 7.0 (H) 05/20/2021   HGBA1C 6.5 (A)  12/26/2020   HGBA1C 6.9 (A) 10/16/2020   HGBA1C 5.4 09/26/2019   Lab Results  Component Value Date   INSULIN 23.4 05/20/2021   Lab Results  Component Value Date   TSH 1.570 05/20/2021   Lab Results  Component Value Date   CHOL 171 05/20/2021   HDL 41 05/20/2021   LDLCALC 107 (H) 05/20/2021   TRIG 125 05/20/2021   CHOLHDL 3.1 09/26/2019   Lab Results  Component Value Date   VD25OH 26.1 (L) 05/20/2021   Lab Results  Component Value Date   WBC CANCELED 05/20/2021   HGB CANCELED 05/20/2021   HCT CANCELED 05/20/2021   MCV CANCELED 05/20/2021   PLT CANCELED 05/20/2021   No results found for: IRON, TIBC, FERRITIN  Attestation Statements:   Reviewed by clinician on day of visit: allergies, medications, problem list, medical history, surgical history, family history, social history, and previous encounter notes.  I, Jackson Latino, RMA, am acting as Energy manager for [employer], DO.   I have reviewed the above documentation for accuracy and completeness, and I agree with the above. Corinna Capra, DO

## 2021-07-10 ENCOUNTER — Encounter (INDEPENDENT_AMBULATORY_CARE_PROVIDER_SITE_OTHER): Payer: Self-pay | Admitting: Bariatrics

## 2021-07-15 ENCOUNTER — Ambulatory Visit: Payer: 59 | Admitting: Licensed Clinical Social Worker

## 2021-07-15 DIAGNOSIS — Z7189 Other specified counseling: Secondary | ICD-10-CM

## 2021-07-15 NOTE — Patient Instructions (Signed)
Licensed Clinical Social Worker Visit Information  Goals we discussed today:   Goals Addressed             This Visit's Progress    Coping Skills Enhanced       Patient Goals/Self-Care Activities: Over the next 7 days I have placed a referral with [facility] to assist with locating a mental health provider          Christian Barrett was given information about Care Management services today including:  Care Management services include personalized support from designated clinical staff supervised by his physician, including individualized plan of care and coordination with other care providers 24/7 contact phone numbers for assistance for urgent and routine care needs. The patient may stop Care Management services at any time by phone call to the office staff.  Patient agreed to services and verbal consent obtained.  Patient verbalizes understanding of instructions provided today and agrees to view in MyChart.   Follow up plan: Appointment scheduled for SW follow up with client by phone on: 07/18/2021  [name], LCSW Care Management & Coordination  [facility] / [employer]   [phone]

## 2021-07-15 NOTE — Chronic Care Management (AMB) (Signed)
Care Management  Clinical Social Work Note  07/15/2021 Name: SHILOH SWOPES MRN: [id number] DOB: [birth year]  TYRION GLAUDE is a 35 y.o. year old male who is a primary care patient of Derrel Nip, MD. The CCM team was consulted for assistance with care coordination needs: Mental Health Counseling and Resources   Mr. Marcoux was given information about Care Management services today including:  Care Management services include personalized support from designated clinical staff supervised by his physician, including individualized plan of care and coordination with other care providers 24/7 contact phone numbers for assistance for urgent and routine care needs. The patient may stop care management services at any time (effective at the end of the month) by phone call to the office staff.  Patient agreed to services and consent obtained.   Engaged with patient by telephone for initial visit in response to provider referral for social care coordination services.   Assessment:Assessed patient's previous and current treatment, coping skills, support system and barriers to care. He would like to start counseling but continues to experience difficulty with find a mental health provider in network with his insurance.. Patient is open for LCSW to explore all options for virtual and face to face therapy.  Previous collaboration with CCM RN for patient's ongoing needs.       See Care Plan below for interventions and patient self-care actives.  Follow up Plan: Patient would like continued follow-up from CCM LCSW .  per patient's request will follow up in 3 days.  Will call office if needed prior to next encounter.   Review of patient past medical history, allergies, medications, and health status, including review of pertinent consultant reports was performed as part of comprehensive evaluation and provision of care management/care coordination services.   SDOH (Social Determinants of Health)  screening and interventions performed today:  SDOH Interventions    Flowsheet Row Most Recent Value  SDOH Interventions   SDOH Interventions for the Following Domains Stress  Stress Interventions Other (Comment)  [[facility] referral placed]       Advanced Directives Status:Not addressed in this encounter.     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 Clinical Goal(s): patient will work with LCSW to address needs related to locating a mental health provider   Clinical Interventions:  Assessed patient's previous and current treatment, coping skills, support system and barriers to care  Solution-Focused Strategies employed: barriers to care Discussed referral to [facility] to assist with connecting to mental health provider ; Referral placed Review various resources, discussed options and provided patient information about  Options for mental health treatment based on need and insurance Inter-disciplinary care team collaboration (see longitudinal plan of care) Patient Goals/Self-Care Activities: Over the next 7 days I have placed a referral with [facility] to assist with locating a mental health provider      Gavin Pound  Bonnielee Haff Care Management & Coordination  [facility] / [employer]   [phone]

## 2021-07-18 ENCOUNTER — Ambulatory Visit: Payer: 59 | Admitting: Licensed Clinical Social Worker

## 2021-07-18 DIAGNOSIS — F419 Anxiety disorder, unspecified: Secondary | ICD-10-CM

## 2021-07-18 DIAGNOSIS — Z7189 Other specified counseling: Secondary | ICD-10-CM

## 2021-07-18 NOTE — Chronic Care Management (AMB) (Signed)
Care Management  Clinical Social Work Note  07/18/2021 Name: Christian Barrett MRN: [id number] DOB: September 16, 1985  Christian Barrett is a 35 y.o. year old male who is a primary care patient of Christian Nip, MD. The CCM team was consulted for assistance with care coordination needs. [facility]  and connect for ongoing counseling.    Consent to Services:  The patient was given information about Care Management services, agreed to services, and gave verbal consent prior to initiation of services.  Please see initial visit note for detailed documentation.   Patient agreed to services today and consent obtained.  Engaged with patient by telephone for follow up visit in response to provider referral for social work care coordination services.   Assessment/Interventions: Assessed patient's current treatment, progress, coping skills, support system and barriers to care.  He continues to experience difficulty with finding a therapist in-network with his insurance.  Also reports concerns with not being able to pay his rent.. See Care Plan below for interventions and patient self-care actives.  Recommendation: Patient may benefit from, and is in agreement to go to DSS for assistance, LCSW also placed [facility] referral for rent and [facility] for counseling.   Follow up Plan: Patient would like continued follow-up from CCM LCSW .  per patient's request will follow up in 30 day.  Will call office if needed prior to next encounter.   Review of patient past medical history, allergies, medications, and health status, including review of pertinent consultant reports was performed as part of comprehensive evaluation and provision of care management/care coordination services.   SDOH (Social Determinants of Health) screening and interventions performed today:  SDOH Interventions    Flowsheet Row Most Recent Value  SDOH Interventions   Financial Strain Interventions [id number] Referral   Housing Interventions Other (Comment)  [Going to DSS for help with rent]  Stress Interventions Other (Comment)  [referral placed for counseling]       Advanced Directives Status:Not addressed in this encounter.     Care Plan    Conditions to be addressed/monitored per PCP order: Anxiety, Financial constraints related to no income.  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 Clinical Goal(s): patient will work with LCSW to address needs related to locating a mental health provider   Clinical Interventions:  Assessed patient's previous and current treatment, coping skills, support system and barriers to care  Solution-Focused Strategies employed: barriers to care Collaborated with [facility]; discussed option with patient Made referral to [place]   Review various resources, discussed options and provided patient information about  Options for mental health treatment based on need and insurance SDOH needs discussed referral placed to [facility]  Inter-disciplinary care team collaboration (see longitudinal plan of care) Patient Goals/Self-Care Activities: Over the next 30 days I have placed a referral with [facility] and  A referral via [facility] to assist with your rent  Christian Hines, LCSW Care Management & Coordination  [facility] / [employer]   [phone]

## 2021-07-18 NOTE — Patient Instructions (Addendum)
Licensed Clinical Social Worker Visit Information  Goals we discussed today:   Goals Addressed             This Visit's Progress    Coping Skills Enhanced       Patient Goals/Self-Care Activities: Over the next 30 days I have placed a referral with [facility] and  A referral via [facility] to assist with your rent      Materials provided: Verbal education about connecting to resources provided by phone  Christian Barrett was given information about Care Management services today including:  Care Management services include personalized support from designated clinical staff supervised by his physician, including individualized plan of care and coordination with other care providers 24/7 contact phone numbers for assistance for urgent and routine care needs. The patient may stop Care Management services at any time by phone call to the office staff.  Patient agreed to services and verbal consent obtained.   Patient verbalizes understanding of instructions provided today and agrees to view in MyChart.   It was a pleasure speaking with you today. Please call the office if needed Follow up appointment is scheduled 12/ 15/ 22   Christian Barrett, [place] Care Management & Coordination  [phone]

## 2021-07-19 ENCOUNTER — Telehealth: Payer: Self-pay

## 2021-07-19 NOTE — Telephone Encounter (Signed)
Completed medical records for [facility] Faxed to [phone]

## 2021-07-20 ENCOUNTER — Encounter: Payer: Self-pay | Admitting: Family Medicine

## 2021-07-21 ENCOUNTER — Other Ambulatory Visit (INDEPENDENT_AMBULATORY_CARE_PROVIDER_SITE_OTHER): Payer: Self-pay | Admitting: Bariatrics

## 2021-07-21 DIAGNOSIS — E559 Vitamin D deficiency, unspecified: Secondary | ICD-10-CM

## 2021-07-22 NOTE — Telephone Encounter (Signed)
Pt last seen by Dr. Brown.  

## 2021-07-29 ENCOUNTER — Telehealth: Payer: Self-pay

## 2021-07-29 ENCOUNTER — Ambulatory Visit: Payer: Self-pay | Admitting: Licensed Clinical Social Worker

## 2021-07-29 ENCOUNTER — Telehealth: Payer: 59

## 2021-07-29 NOTE — Telephone Encounter (Signed)
   RN Case Manager Care Management   Phone Outreach    07/29/2021 Name: MIZAEL SAGAR MRN: [id number] DOB: 1986-08-01  ZADEN SAKO is a 35 y.o. year old male who is a primary care patient of Derrel Nip, MD .   Telephone outreach was unsuccessful A HIPPA compliant phone message was left for the patient providing contact information and requesting a return call.   Follow Up Plan: Will route chart to Care Guide to see if patient would like to reschedule phone appointment    Review of patient status, including review of consultants reports, relevant laboratory and other test results, and collaboration with appropriate care team members and the patient's provider was performed as part of comprehensive patient evaluation and provision of care management services.    Juanell Fairly RN, BSN, [facility] Care Management Coordinator [facility] Phone: [phone] I Fax: [phone]

## 2021-07-29 NOTE — Patient Instructions (Signed)
   Patient was not contacted during this encounter.  LCSW collaborated with care team to accomplish patient's care plan goal   Goldman Birchall, LCSW Care Management & Coordination  [phone]  

## 2021-07-29 NOTE — Chronic Care Management (AMB) (Signed)
  Care Management  Collaboration  Note  07/29/2021 Name: Christian Barrett MRN: [id number] DOB: 1985/11/12  Christian Barrett is a 35 y.o. year old male who is a primary care patient of Christian Nip, MD. The CCM team was consulted reference care coordination needs for Mental Health Counseling and Resources.  Assessment: Patient was not interviewed or contacted during this encounter.  [name] has not been able to contact patient to schedule appointment.  They have made two attempts.   CCM RN was unable to reach patient during her encounter today. See Care Plan or interventions for patient self-care actives.   Intervention:Conducted brief assessment, recommendations and relevant information discussed. CCM LCSW collaborated with CCM RN and [name] .    Follow up Plan:  LCSW has f/u appointment with patient in 2 weeks.    Review of patient past medical history, allergies, medications, and health status, including review of pertinent consultant reports was performed as part of comprehensive evaluation and provision of care management/care coordination services.   Christian Hines, LCSW Care Management & Coordination  [facility] / [employer]   [phone]

## 2021-08-07 ENCOUNTER — Telehealth: Payer: Self-pay | Admitting: *Deleted

## 2021-08-07 NOTE — Chronic Care Management (AMB) (Signed)
  Care Management   Note  08/07/2021 Name: Christian Barrett MRN: [id number] DOB: 1986/08/19  Christian Barrett is a 35 y.o. year old male who is a primary care patient of Nobie Putnam Alecia Lemming, MD and is actively engaged with the care management team. I reached out to [name] by phone today to assist with scheduling a follow up visit with the RN Case Manager  Follow up plan: Unsuccessful telephone outreach attempt made. A HIPAA compliant phone message was left for the patient providing contact information and requesting a return call.  The care management team will reach out to the patient again over the next 7 days.  If patient returns call to provider office, please advise to call Embedded Care Management Care Guide Misty Stanley at [phone].  Gwenevere Ghazi  Care Guide, Embedded Care Coordination [facility] Management  Direct Dial: [phone]

## 2021-08-13 NOTE — Chronic Care Management (AMB) (Signed)
  Care Management   Note  08/13/2021 Name: [name] MRN: [id number] DOB: 08/01/1986  Christian Barrett is a 35 y.o. year old male who is a primary care patient of Derrel Nip, MD and is actively engaged with the care management team. I reached out to [name] by phone today to assist with re-scheduling a follow up visit with the RN Case Manager  Follow up plan: Unsuccessful telephone outreach attempt made. A HIPAA compliant phone message was left for the patient providing contact information and requesting a return call.  The care management team will reach out to the patient again over the next 7 days.  If patient returns call to provider office, please advise to call Embedded Care Management Care Guide Misty Stanley at [phone].  Gwenevere Ghazi  Care Guide, Embedded Care Coordination [facility] Management  Direct Dial: [phone]

## 2021-08-20 NOTE — Chronic Care Management (AMB) (Signed)
°  Care Management   Note  08/20/2021 Name: Christian Barrett MRN: [id number] DOB: 1986/07/03  Christian Barrett is a 35 y.o. year old male who is a primary care patient of Derrel Nip, MD and is actively engaged with the care management team. I reached out to [name] by phone today to assist with re-scheduling a follow up visit with the RN Case Manager  Follow up plan: A third unsuccessful telephone outreach attempt made. A HIPAA compliant phone message was left for the patient providing contact information and requesting a return call. We have been unable to make contact with the patient for follow up. The care management team is available to follow up with the patient after provider conversation with the patient regarding recommendation for care management engagement and subsequent re-referral to the care management team.   [facility] Guide, Embedded Care Coordination [facility]   Care Management  Direct Dial: [phone]

## 2021-08-22 ENCOUNTER — Ambulatory Visit: Payer: 59 | Admitting: Licensed Clinical Social Worker

## 2021-08-22 DIAGNOSIS — F419 Anxiety disorder, unspecified: Secondary | ICD-10-CM

## 2021-08-22 DIAGNOSIS — Z7689 Persons encountering health services in other specified circumstances: Secondary | ICD-10-CM

## 2021-08-22 NOTE — Chronic Care Management (AMB) (Signed)
Care Management  Clinical Social Work Note  08/22/2021 Name: JACKSYN BEEKS MRN: [id number] DOB: Nov 30, 1985  FINIAN HELVEY is a 35 y.o. year old male who is a primary care patient of Derrel Nip, MD. The CCM team was consulted for assistance with care coordination needs. Mental Health Counseling and Resources   Consent to Services:  The patient was given information about Care Management services, agreed to services, and gave verbal consent prior to initiation of services.  Please see initial visit note for detailed documentation.   Patient agreed to services today and consent obtained.  Engaged with patient by telephone for follow up visit in response to provider referral for social work care coordination services. Assessed patient's current treatment, progress, coping skills, support system and barriers to care.  He has decided to work with [facility] for his mental health needs and no longer needs CCM services .   Assessment includes: Review of patient past medical history, allergies, medications, and health status, including review of pertinent consultant reports was performed as part of comprehensive evaluation and provision of care management/care coordination services. See Care Plan below for interventions and patient self-care actives.   SDOH (Social Determinants of Health) screening and interventions performed today:   Advanced Directives Status:Not addressed in this encounter.     Care Plan    Conditions to be addressed/monitored per PCP order: , Mental Health Concerns   Care Plan : General Social Work (Adult)  Updates made by Soundra Pilon, LCSW since 08/22/2021 12:00 AM     Problem: Coping Skills (General Plan of Care)      Goal: Coping Skills Enhanced by connect for ongoing therapy Completed 08/22/2021  Start Date: 07/15/2021  This Visit's Progress: On track  Recent Progress: On track  Priority: High  Note:   Current barriers:   Chronic Mental Health needs  related to symptoms of anxiety and PDST Unable to find a provider that takes his insurance  Needs Support, Education, and Care Coordination in order to meet unmet mental health needs. Clinical Goal(s): patient will work with LCSW to address needs related to locating a mental health provider   Clinical Interventions:  Assessed patient's previous and current treatment, coping skills, support system and barriers to care  Patient has decided to work with Dis[facility] and [facility] for his mental health needs   Review various resources, discussed options and provided patient information about  Options for mental health treatment based on need and insurance SDOH needs discussed referral placed to [facility] - they have accepted referral for patient  Inter-disciplinary care team collaboration (see longitudinal plan of care) Patient Goals/Self-Care Activities:  Continue to follow up and work with [facility] and [facility]     Follow up Plan:  Patient does not desire continued follow-up by CCM LCSW. Will contact the office if needed CCM LCSW will disconnect from patient's care team at this time, but will be available at any time they would like to re-engage for care coordination services.   [name], LCSW Care Management & Coordination  [facility] / [employer]   [phone]

## 2021-08-22 NOTE — Patient Instructions (Signed)
Visit Information  Thank you for taking time to visit with me today. Please don't hesitate to contact me if I can be of assistance to you before our next scheduled telephone appointment.  Following are the goals we discussed today: Connecting for mental health treatment Patient Goals/Self-Care Activities:  Continue to follow up and work with Dis[facility] and [facility]   Please call the care guide team at [phone] if you need to cancel or reschedule your appointment.   If you are experiencing a Mental Health or Behavioral Health Crisis or need someone to talk to, please call the Suicide and Crisis Lifeline: 988 call the [place] National Suicide Prevention Lifeline: 931-542-0869 or TTY: 587 317 9551 TTY 334-095-5654) to talk to a trained counselor call 1-800-273-TALK (toll free, 24 hour hotline) go to [facility] [address], [place] [phone]) call 911   No follow up scheduled, per our conversation you do not desire continued follow up I will disconnect from your care team at this time, please call the office if additional needs are identified  Patient verbalizes understanding of instructions provided today and agrees to view in MyChart.  [name], LCSW Care Management & Coordination  [phone]

## 2021-09-08 ENCOUNTER — Telehealth: Payer: 59 | Admitting: Physician Assistant

## 2021-09-08 DIAGNOSIS — B999 Unspecified infectious disease: Secondary | ICD-10-CM

## 2021-09-08 DIAGNOSIS — J4551 Severe persistent asthma with (acute) exacerbation: Secondary | ICD-10-CM | POA: Diagnosis not present

## 2021-09-08 DIAGNOSIS — Z20822 Contact with and (suspected) exposure to covid-19: Secondary | ICD-10-CM

## 2021-09-08 MED ORDER — BENZONATATE 100 MG PO CAPS: 100.0000 mg | ORAL_CAPSULE | Freq: Three times a day (TID) | ORAL | 0 refills | Status: DC | PRN

## 2021-09-08 MED ORDER — AZITHROMYCIN 250 MG PO TABS: ORAL_TABLET | ORAL | 0 refills | Status: AC

## 2021-09-08 MED ORDER — IPRATROPIUM BROMIDE 0.03 % NA SOLN: 2.0000 | Freq: Two times a day (BID) | NASAL | 0 refills | Status: AC

## 2021-09-08 NOTE — Progress Notes (Signed)
We are sorry that you are not feeling well.  Here is how we plan to help!  I would highly recommend to test for Covid 19 as you would be high risk for complications. If you do test positive we could start Anti-viral treatment. Starting this ASAP help to lessen severity of illness and complications.   Being that you are high risk for these complications, I am treating like you may have a superimposed bacterial infection as well, but do recommend to Covid test still also. If you are Covid positive the antibiotics may not be as effective.   Based on your presentation I believe you most likely have A cough due to bacteria.  When patients have a fever and a productive cough with a change in color or increased sputum production, we are concerned about bacterial bronchitis.  If left untreated it can progress to pneumonia.  If your symptoms do not improve with your treatment plan it is important that you contact your provider.   I have prescribed Azithromyin 250 mg: two tablets now and then one tablet daily for 4 additonal days    In addition you may use A prescription cough medication called Tessalon Perles 100mg . You may take 1-2 capsules every 8 hours as needed for your cough.  Ipratropium nasal spray will be prescribed for nasal congestion and drainage.   From your responses in the eVisit questionnaire you describe inflammation in the upper respiratory tract which is causing a significant cough.  This is commonly called Bronchitis and has four common causes:   Allergies Viral Infections Acid Reflux Bacterial Infection Allergies, viruses and acid reflux are treated by controlling symptoms or eliminating the cause. An example might be a cough caused by taking certain blood pressure medications. You stop the cough by changing the medication. Another example might be a cough caused by acid reflux. Controlling the reflux helps control the cough.  USE OF BRONCHODILATOR ("RESCUE") INHALERS: There is a risk  from using your bronchodilator too frequently.  The risk is that over-reliance on a medication which only relaxes the muscles surrounding the breathing tubes can reduce the effectiveness of medications prescribed to reduce swelling and congestion of the tubes themselves.  Although you feel brief relief from the bronchodilator inhaler, your asthma may actually be worsening with the tubes becoming more swollen and filled with mucus.  This can delay other crucial treatments, such as oral steroid medications. If you need to use a bronchodilator inhaler daily, several times per day, you should discuss this with your provider.  There are probably better treatments that could be used to keep your asthma under control.     HOME CARE Only take medications as instructed by your medical team. Complete the entire course of an antibiotic. Drink plenty of fluids and get plenty of rest. Avoid close contacts especially the very young and the elderly Cover your mouth if you cough or cough into your sleeve. Always remember to wash your hands A steam or ultrasonic humidifier can help congestion.   GET HELP RIGHT AWAY IF: You develop worsening fever. You become short of breath You cough up blood. Your symptoms persist after you have completed your treatment plan MAKE SURE YOU  Understand these instructions. Will watch your condition. Will get help right away if you are not doing well or get worse.    Thank you for choosing an e-visit.  Your e-visit answers were reviewed by a board certified advanced clinical practitioner to complete your personal care plan.  Depending upon the condition, your plan could have included both over the counter or prescription medications.  Please review your pharmacy choice. Make sure the pharmacy is open so you can pick up prescription now. If there is a problem, you may contact your provider through [employer] and have the prescription routed to another pharmacy.  Your  safety is important to [place]. If you have drug allergies check your prescription carefully.   For the next 24 hours you can use MyChart to ask questions about today's visit, request a non-urgent call back, or ask for a work or school excuse. You will get an email in the next two days asking about your experience. I hope that your e-visit has been valuable and will speed your recovery.  I provided 5 minutes of non face-to-face time during this encounter for chart review and documentation.

## 2021-09-26 NOTE — Telephone Encounter (Signed)
Attempted to schedule no ans no vm  

## 2021-10-09 ENCOUNTER — Encounter: Payer: Self-pay | Admitting: Pulmonary Disease

## 2021-10-09 ENCOUNTER — Ambulatory Visit (INDEPENDENT_AMBULATORY_CARE_PROVIDER_SITE_OTHER): Payer: Self-pay

## 2021-10-09 ENCOUNTER — Ambulatory Visit (INDEPENDENT_AMBULATORY_CARE_PROVIDER_SITE_OTHER): Payer: 59 | Admitting: Pulmonary Disease

## 2021-10-09 ENCOUNTER — Other Ambulatory Visit: Payer: Self-pay

## 2021-10-09 VITALS — BP 126/76 | HR 95 | Temp 98.8°F | Ht 65.0 in | Wt 366.0 lb

## 2021-10-09 DIAGNOSIS — G4733 Obstructive sleep apnea (adult) (pediatric): Secondary | ICD-10-CM | POA: Diagnosis not present

## 2021-10-09 DIAGNOSIS — R0602 Shortness of breath: Secondary | ICD-10-CM

## 2021-10-09 LAB — CBC WITH DIFFERENTIAL/PLATELET
Basophils Absolute: 0.1 10*3/uL (ref 0.0–0.1)
Basophils Relative: 0.6 % (ref 0.0–3.0)
Eosinophils Absolute: 0.2 10*3/uL (ref 0.0–0.7)
Eosinophils Relative: 1.9 % (ref 0.0–5.0)
HCT: 41.8 % (ref 39.0–52.0)
Hemoglobin: 14.1 g/dL (ref 13.0–17.0)
Lymphocytes Relative: 22.8 % (ref 12.0–46.0)
Lymphs Abs: 2.3 10*3/uL (ref 0.7–4.0)
MCHC: 33.6 g/dL (ref 30.0–36.0)
MCV: 79.8 fl (ref 78.0–100.0)
Monocytes Absolute: 0.8 10*3/uL (ref 0.1–1.0)
Monocytes Relative: 8.2 % (ref 3.0–12.0)
Neutro Abs: 6.7 10*3/uL (ref 1.4–7.7)
Neutrophils Relative %: 66.5 % (ref 43.0–77.0)
Platelets: 310 10*3/uL (ref 150.0–400.0)
RBC: 5.24 Mil/uL (ref 4.22–5.81)
RDW: 14.5 % (ref 11.5–15.5)
WBC: 10.1 10*3/uL (ref 4.0–10.5)

## 2021-10-09 LAB — COMPREHENSIVE METABOLIC PANEL
ALT: 34 U/L (ref 0–53)
AST: 23 U/L (ref 0–37)
Albumin: 4.1 g/dL (ref 3.5–5.2)
Alkaline Phosphatase: 70 U/L (ref 39–117)
BUN: 11 mg/dL (ref 6–23)
CO2: 32 mEq/L (ref 19–32)
Calcium: 8.9 mg/dL (ref 8.4–10.5)
Chloride: 101 mEq/L (ref 96–112)
Creatinine, Ser: 0.68 mg/dL (ref 0.40–1.50)
GFR: 118.62 mL/min (ref >60.00)
Glucose, Bld: 150 mg/dL — ABNORMAL HIGH (ref 70–99)
Potassium: 4.1 mEq/L (ref 3.5–5.1)
Sodium: 138 mEq/L (ref 135–145)
Total Bilirubin: 0.5 mg/dL (ref 0.2–1.2)
Total Protein: 7 g/dL (ref 6.0–8.3)

## 2021-10-09 LAB — TSH: TSH: 1.1 u[IU]/mL (ref 0.35–5.50)

## 2021-10-09 MED ORDER — BUDESONIDE-FORMOTEROL FUMARATE 160-4.5 MCG/ACT IN AERO: 2.0000 | INHALATION_SPRAY | Freq: Two times a day (BID) | RESPIRATORY_TRACT | 6 refills | Status: AC

## 2021-10-09 NOTE — Progress Notes (Signed)
Christian Barrett    [id number]    17-Jun-1986  Primary Care Physician:Cresenzo, Alecia Lemming, MD  Referring Physician: Derrel Nip, MD [address] [place],  [place] [address]  Chief complaint:   Patient being seen for obstructive sleep apnea Nonrestorative sleep, still wakes up feeling tired and exhausted, daytime sleepiness  HPI:  Diagnosed with sleep apnea in 2021 Stated he started using CPAP in 2022  He had had a previous machine which went up to a pressure of 13 but nowadays when he checks his pressure he says he only sees a pressure of 8 starts at 5 and goes up to 8  Uses CPAP nightly but does not wake up feeling rejuvenated or rested  Has a history of asthma, was told that he may have COPD Has been on multiple courses of steroids and multiple treatments for asthma exacerbation over the years History of hypertension, angina, diabetes, chronic headaches, severe neuropathy  Is massively obese, has not been as active lately, feels most of his weight gain is related to being on steroids multiple periods, last year was on steroids for about 9 months  He does not smoke Admits to shortness of breath Some chest discomfort  Outpatient Encounter Medications as of 10/09/2021  Medication Sig   albuterol (VENTOLIN HFA) 108 (90 Base) MCG/ACT inhaler Inhale 2 puffs into the lungs every 6 (six) hours as needed for wheezing or shortness of breath.   escitalopram (LEXAPRO) 10 MG tablet Take 1 tablet (10 mg total) by mouth daily.   ipratropium (ATROVENT) 0.03 % nasal spray Place 2 sprays into both nostrils every 12 (twelve) hours.   lisinopril (ZESTRIL) 20 MG tablet TAKE ONE TAB A DAY FOR BLOOD PRESSURE (Patient taking differently: Take 20 mg by mouth daily. TAKE ONE TAB A DAY FOR BLOOD PRESSURE)   metFORMIN (GLUCOPHAGE-XR) 500 MG 24 hr tablet Take 1 tablet (500 mg total) by mouth daily with breakfast.   ondansetron (ZOFRAN) 4 MG tablet Take 1 tablet (4 mg total) by mouth every 8  (eight) hours as needed for nausea or vomiting.   OXYGEN Inhale into the lungs. Bled thru Cpap machine.  Order placed with [facility] 01/02/21   Vitamin D, Ergocalciferol, (DRISDOL) 1.25 MG (50000 UNIT) CAPS capsule Take 1 capsule (50,000 Units total) by mouth every 7 (seven) days.   [DISCONTINUED] benzonatate (TESSALON) 100 MG capsule Take 1 capsule (100 mg total) by mouth 3 (three) times daily as needed. (Patient not taking: Reported on 10/09/2021)   No facility-administered encounter medications on file as of 10/09/2021.    Allergies as of 10/09/2021 - Review Complete 10/09/2021  Allergen Reaction Noted   Lamotrigine Rash 04/30/2017   Tramadol Nausea And Vomiting and Other (See Comments) 06/15/2015    Past Medical History:  Diagnosis Date   Anxiety    Asthma    Bipolar disorder (HCC)    Chest pain    a. 09/2019 Cath: LM nl, LAD nl w/ ? distal myocardial bridge, LCX nl, RCA nl. EF 65%. LVEDP 20, RA 16, PA 37/20(26), PCWP 20. CO/CI 8.0/3.2.   COPD (chronic obstructive pulmonary disease) (HCC)    DOE (dyspnea on exertion)    a. 10/2019 Echo: EF 60-65%, borderline LVH. NO rwma. Nl RV fxn. Triv TR.   Hepatic steatosis    Hypertension    Morbid obesity (HCC)    Renal disorder    Sleep apnea     Past Surgical History:  Procedure Laterality Date  BACK SURGERY     CARDIAC CATHETERIZATION     RIGHT/LEFT HEART CATH AND CORONARY ANGIOGRAPHY N/A 10/04/2019   Procedure: RIGHT/LEFT HEART CATH AND CORONARY ANGIOGRAPHY;  Surgeon: Yvonne KendallEnd, Christopher, MD;  Location: [facility] INVASIVE CV LAB;  Service: Cardiovascular;  Laterality: N/A;   SHOULDER ARTHROSCOPY  2018   HAD BONE SPURS REMOVED    Family History  Problem Relation Age of Onset   Drug abuse Mother    Alcoholism Mother    Bipolar disorder Mother    Depression Mother    Anxiety disorder Mother    Cancer Mother    Heart attack Mother    Hyperlipidemia Mother    Hypertension Mother    Diabetes Mother    Coronary artery disease Mother [id number]        CABG   Lung cancer Mother    Hypertension Father    Diabetes Father    Heart attack Paternal Grandmother    Drug abuse Other     Social History   Socioeconomic History   Marital status: Married    Spouse name: Printmakerholly Colavito   Number of children: 0   Years of education: Not on file   Highest education level: Not on file  Occupational History   Occupation: Disabled  Tobacco Use   Smoking status: Never   Smokeless tobacco: Never  Vaping Use   Vaping Use: Never used  Substance and Sexual Activity   Alcohol use: Never   Drug use: Never   Sexual activity: Never  Other Topics Concern   Not on file  Social History Narrative   LIVES AT HOME WITH WIFE IN PRIVATE RESIDENCE   Social Determinants of Health   Financial Resource Strain: High Risk   Difficulty of Paying Living Expenses: Very hard  Food Insecurity: No Food Insecurity   Worried About Programme researcher, broadcasting/film/videounning Out of Food in the Last Year: Never true   Ran Out of Food in the Last Year: Never true  Transportation Needs: No Transportation Needs   Lack of Transportation (Medical): No   Lack of Transportation (Non-Medical): No  Physical Activity: Not on file  Stress: Not on file  Social Connections: Not on file  Intimate Partner Violence: Not on file    Review of Systems  Constitutional:  Positive for fatigue.  Respiratory:  Positive for shortness of breath.   Psychiatric/Behavioral:  Positive for sleep disturbance.    There were no vitals filed for this visit.   Physical Exam Constitutional:      Appearance: He is obese.  HENT:     Head: Normocephalic.     Nose: Nose normal.     Mouth/Throat:     Mouth: Mucous membranes are moist.     Comments: Mallampati 4, crowded oropharynx, macroglossia Eyes:     Extraocular Movements: Extraocular movements intact.     Pupils: Pupils are equal, round, and reactive to light.  Cardiovascular:     Rate and Rhythm: Normal rate and regular rhythm.     Heart sounds: No murmur heard.    No friction rub.  Pulmonary:     Effort: No respiratory distress.     Breath sounds: No stridor. No wheezing or rhonchi.  Musculoskeletal:     Cervical back: Normal range of motion. No rigidity or tenderness.  Neurological:     Mental Status: He is alert.  Psychiatric:        Mood and Affect: Mood normal.   Results of the Epworth flowsheet 10/09/2021  Sitting and reading 3  Watching TV 0  Sitting, inactive in a public place (e.g. a theatre or a meeting) 3  As a passenger in a car for an hour without a break 3  Lying down to rest in the afternoon when circumstances permit 2  Sitting and talking to someone 2  Sitting quietly after a lunch without alcohol 2  In a car, while stopped for a few minutes in traffic 1  Total score 16    Data Reviewed: Sleep study from 11/17/2019-titration study reviewed -Patient was started on auto titrating CPAP settings of 5-20  Assessment:  Obstructive sleep apnea on CPAP therapy  Superobesity  Neuropathy  Hypertension, asthma, obstructive lung disease  Despite compliance with CPAP and compliance data showing optimal compliance with machine settings of 5-20, 95 percentile pressure of 9.3 with a residual AHI of 1.1, patient is not feeling better  May have obesity hypoventilation  Plan/Recommendations: Schedule patient for retitration and may require oxygen supplementation  Encourage aggressive weight loss measures  Start on Symbicort 160 to be used twice a day Albuterol use as needed  Obtain a chest x-ray today, obtain CBC, Chem-7, thyroid function test  Patient encouraged to seek medical assistance if there is any significant change in his chest discomfort which has been chronic  I encouraged him to see a primary care provider as well to optimize his care  Tentative follow-up in about 4 weeks  Virl Diamond MD [facility] 10/09/2021, 1:39 PM  CC: Derrel Nip, MD

## 2021-10-09 NOTE — Patient Instructions (Addendum)
The download from the machine shows that the CPAP is doing well for you sleep apnea  With recommendation to use oxygen in the past and you not feeling well at all at present It is appropriate to schedule you for a titration study to ensure the sleep apnea is well treated and also need for oxygen at night  Chest x-ray today CBC, Chem-7, thyroid function test  Schedule for pulmonary function test  Prescription for Symbicort to be used twice a day  Encourage aggressive weight loss measures  Chest discomfort -Has been ongoing for a while -Encouraged to get evaluated in the hospital if there is a concern for his heart or any worsening of the chest pain   May not be sleeping very well because your pressure setting may not be optimal  If we cannot get adequate information from the smart card, you may need retesting so that we can titrate you to the optimal pressure  Weight loss needs to happen for you to feel better  You need to get yourself established with a primary care doctor

## 2021-10-10 NOTE — Progress Notes (Signed)
Chest x-ray shows no abnormality, low lung volumes consistent with obesity  Blood work within normal limits

## 2021-10-23 ENCOUNTER — Ambulatory Visit: Payer: Medicaid Other | Admitting: Family Medicine

## 2021-11-08 ENCOUNTER — Ambulatory Visit (HOSPITAL_BASED_OUTPATIENT_CLINIC_OR_DEPARTMENT_OTHER): Payer: Medicaid Other | Attending: Pulmonary Disease | Admitting: Pulmonary Disease

## 2021-11-11 ENCOUNTER — Ambulatory Visit: Payer: 59 | Admitting: Nurse Practitioner

## 2021-11-18 ENCOUNTER — Other Ambulatory Visit: Payer: Self-pay | Admitting: Internal Medicine

## 2021-11-18 DIAGNOSIS — I10 Essential (primary) hypertension: Secondary | ICD-10-CM

## 2021-12-14 ENCOUNTER — Other Ambulatory Visit: Payer: Self-pay | Admitting: Internal Medicine

## 2021-12-14 DIAGNOSIS — I10 Essential (primary) hypertension: Secondary | ICD-10-CM

## 2022-02-05 ENCOUNTER — Telehealth: Payer: Medicaid Other | Admitting: Physician Assistant

## 2022-02-05 DIAGNOSIS — J069 Acute upper respiratory infection, unspecified: Secondary | ICD-10-CM

## 2022-02-05 MED ORDER — LIDOCAINE VISCOUS HCL 2 % MT SOLN: OROMUCOSAL | 0 refills | Status: AC

## 2022-02-05 MED ORDER — BENZONATATE 100 MG PO CAPS: 100.0000 mg | ORAL_CAPSULE | Freq: Three times a day (TID) | ORAL | 0 refills | Status: AC | PRN

## 2022-02-05 MED ORDER — FLUTICASONE PROPIONATE 50 MCG/ACT NA SUSP: 2.0000 | Freq: Every day | NASAL | 0 refills | Status: DC

## 2022-02-05 MED ORDER — PROMETHAZINE-DM 6.25-15 MG/5ML PO SYRP: 5.0000 mL | ORAL_SOLUTION | Freq: Four times a day (QID) | ORAL | 0 refills | Status: AC | PRN

## 2022-02-05 NOTE — Patient Instructions (Signed)
Christian Barrett, thank you for joining Margaretann Loveless, PA-C for today's virtual visit.  While this provider is not your primary care provider (PCP), if your PCP is located in our provider database this encounter information will be shared with them immediately following your visit.  Consent: (Patient) Christian Barrett provided verbal consent for this virtual visit at the beginning of the encounter.  Current Medications:  Current Outpatient Medications:    benzonatate (TESSALON) 100 MG capsule, Take 1 capsule (100 mg total) by mouth 3 (three) times daily as needed., Disp: 30 capsule, Rfl: 0   fluticasone (FLONASE) 50 MCG/ACT nasal spray, Place 2 sprays into both nostrils daily., Disp: 16 g, Rfl: 0   lidocaine (XYLOCAINE) 2 % solution, Swallow 34mL every 4-6 hours as needed for sore throat, Disp: 100 mL, Rfl: 0   promethazine-dextromethorphan (PROMETHAZINE-DM) 6.25-15 MG/5ML syrup, Take 5 mLs by mouth 4 (four) times daily as needed., Disp: 118 mL, Rfl: 0   albuterol (VENTOLIN HFA) 108 (90 Base) MCG/ACT inhaler, Inhale 2 puffs into the lungs every 6 (six) hours as needed for wheezing or shortness of breath., Disp: 8 g, Rfl: 2   budesonide-formoterol (SYMBICORT) 160-4.5 MCG/ACT inhaler, Inhale 2 puffs into the lungs every 12 (twelve) hours., Disp: 1 each, Rfl: 6   escitalopram (LEXAPRO) 10 MG tablet, Take 1 tablet (10 mg total) by mouth daily., Disp: 90 tablet, Rfl: 3   ipratropium (ATROVENT) 0.03 % nasal spray, Place 2 sprays into both nostrils every 12 (twelve) hours., Disp: 30 mL, Rfl: 0   lisinopril (ZESTRIL) 20 MG tablet, TAKE ONE TAB A DAY FOR BLOOD PRESSURE (Patient taking differently: Take 20 mg by mouth daily. TAKE ONE TAB A DAY FOR BLOOD PRESSURE), Disp: 90 tablet, Rfl: 1   metFORMIN (GLUCOPHAGE-XR) 500 MG 24 hr tablet, Take 1 tablet (500 mg total) by mouth daily with breakfast., Disp: 90 tablet, Rfl: 3   ondansetron (ZOFRAN) 4 MG tablet, Take 1 tablet (4 mg total) by mouth every 8  (eight) hours as needed for nausea or vomiting., Disp: 20 tablet, Rfl: 0   OXYGEN, Inhale into the lungs. Bled thru Cpap machine.  Order placed with [facility] 01/02/21, Disp: , Rfl:    Vitamin D, Ergocalciferol, (DRISDOL) 1.25 MG (50000 UNIT) CAPS capsule, Take 1 capsule (50,000 Units total) by mouth every 7 (seven) days., Disp: 4 capsule, Rfl: 0   Medications ordered in this encounter:  Meds ordered this encounter  Medications   benzonatate (TESSALON) 100 MG capsule    Sig: Take 1 capsule (100 mg total) by mouth 3 (three) times daily as needed.    Dispense:  30 capsule    Refill:  0    Order Specific Question:   Supervising Provider    Answer:   MILLER, BRIAN [[id number]]   lidocaine (XYLOCAINE) 2 % solution    Sig: Swallow 62mL every 4-6 hours as needed for sore throat    Dispense:  100 mL    Refill:  0    Order Specific Question:   Supervising Provider    Answer:   Eber Hong [[id number]]   promethazine-dextromethorphan (PROMETHAZINE-DM) 6.25-15 MG/5ML syrup    Sig: Take 5 mLs by mouth 4 (four) times daily as needed.    Dispense:  118 mL    Refill:  0    Order Specific Question:   Supervising Provider    Answer:   MILLER, BRIAN [[id number]]   fluticasone (FLONASE) 50 MCG/ACT nasal spray    Sig: Place 2 sprays  into both nostrils daily.    Dispense:  16 g    Refill:  0    Order Specific Question:   Supervising Provider    Answer:   Hyacinth Meeker, BRIAN [[id number]]     *If you need refills on other medications prior to your next appointment, please contact your pharmacy*  Follow-Up: Call back or seek an in-person evaluation if the symptoms worsen or if the condition fails to improve as anticipated.  Other Instructions Upper Respiratory Infection, Adult An upper respiratory infection (URI) affects the nose, throat, and upper airways that lead to the lungs. The most common type of URI is often called the common cold. URIs usually get better on their own, without medical treatment. What are the causes? A URI  is caused by a germ (virus). You may catch these germs by: Breathing in droplets from an infected person's cough or sneeze. Touching something that has the germ on it (is contaminated) and then touching your mouth, nose, or eyes. What increases the risk? You are more likely to get a URI if: You are very young or very old. You have close contact with others, such as at work, school, or a health care facility. You smoke. You have long-term (chronic) heart or lung disease. You have a weakened disease-fighting system (immune system). You have nasal allergies or asthma. You have a lot of stress. You have poor nutrition. What are the signs or symptoms? Runny or stuffy (congested) nose. Cough. Sneezing. Sore throat. Headache. Feeling tired (fatigue). Fever. Not wanting to eat as much as usual. Pain in your forehead, behind your eyes, and over your cheekbones (sinus pain). Muscle aches. Redness or irritation of the eyes. Pressure in the ears or face. How is this treated? URIs usually get better on their own within 7-10 days. Medicines cannot cure URIs, but your doctor may recommend certain medicines to help relieve symptoms, such as: Over-the-counter cold medicines. Medicines to reduce coughing (cough suppressants). Coughing is a type of defense against infection that helps to clear the nose, throat, windpipe, and lungs (respiratory system). Take these medicines only as told by your doctor. Medicines to lower your fever. Follow these instructions at home: Activity Rest as needed. If you have a fever, stay home from work or school until your fever is gone, or until your doctor says you may return to work or school. You should stay home until you cannot spread the infection anymore (you are not contagious). Your doctor may have you wear a face mask so you have less risk of spreading the infection. Relieving symptoms Rinse your mouth often with salt water. To make salt water, dissolve -1  tsp (3-6 g) of salt in 1 cup (237 mL) of warm water. Use a cool-mist humidifier to add moisture to the air. This can help you breathe more easily. Eating and drinking  Drink enough fluid to keep your pee (urine) pale yellow. Eat soups and other clear broths. General instructions  Take over-the-counter and prescription medicines only as told by your doctor. Do not smoke or use any products that contain nicotine or tobacco. If you need help quitting, ask your doctor. Avoid being where people are smoking (avoid secondhand smoke). Stay up to date on all your shots (immunizations), and get the flu shot every year. Keep all follow-up visits. How to prevent the spread of infection to others  Wash your hands with soap and water for at least 20 seconds. If you cannot use soap and water, use hand  sanitizer. Avoid touching your mouth, face, eyes, or nose. Cough or sneeze into a tissue or your sleeve or elbow. Do not cough or sneeze into your hand or into the air. Contact a doctor if: You are getting worse, not better. You have any of these: A fever or chills. Brown or red mucus in your nose. Yellow or brown fluid (discharge)coming from your nose. Pain in your face, especially when you bend forward. Swollen neck glands. Pain when you swallow. White areas in the back of your throat. Get help right away if: You have shortness of breath that gets worse. You have very bad or constant: Headache. Ear pain. Pain in your forehead, behind your eyes, and over your cheekbones (sinus pain). Chest pain. You have long-lasting (chronic) lung disease along with any of these: Making high-pitched whistling sounds when you breathe, most often when you breathe out (wheezing). Long-lasting cough (more than 14 days). Coughing up blood. A change in your usual mucus. You have a stiff neck. You have changes in your: Vision. Hearing. Thinking. Mood. These symptoms may be an emergency. Get help right away.  Call 911. Do not wait to see if the symptoms will go away. Do not drive yourself to the hospital. Summary An upper respiratory infection (URI) is caused by a germ (virus). The most common type of URI is often called the common cold. URIs usually get better within 7-10 days. Take over-the-counter and prescription medicines only as told by your doctor. This information is not intended to replace advice given to you by your health care provider. Make sure you discuss any questions you have with your health care provider. Document Revised: 03/27/2021 Document Reviewed: 03/27/2021 Elsevier Patient Education  2023 Elsevier Inc.    If you have been instructed to have an in-person evaluation today at a local Urgent Care facility, please use the link below. It will take you to a list of all of our available [facility], including address, phone number and hours of operation. Please do not delay care.  [facility]  If you or a family member do not have a primary care provider, use the link below to schedule a visit and establish care. When you choose a [facility] primary care physician or advanced practice provider, you gain a long-term partner in health. Find a Primary Care Provider  Learn more about [facility]'s in-office and virtual care options: [facility] - Get Care Now
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
## 2022-02-10 ENCOUNTER — Telehealth: Payer: Medicaid Other | Admitting: Physician Assistant

## 2022-02-10 DIAGNOSIS — J4541 Moderate persistent asthma with (acute) exacerbation: Secondary | ICD-10-CM

## 2022-02-10 MED ORDER — FLUTICASONE PROPIONATE HFA 110 MCG/ACT IN AERO: 2.0000 | INHALATION_SPRAY | Freq: Two times a day (BID) | RESPIRATORY_TRACT | 0 refills | Status: AC

## 2022-02-10 NOTE — Progress Notes (Signed)
Visit for Asthma  Based on what you have shared with me, it looks like you may have a flare up of your asthma.  Asthma is a chronic (ongoing) lung disease which results in airway obstruction, inflammation and hyper-responsiveness.   Asthma symptoms vary from person to person, with common symptoms including nighttime awakening and decreased ability to participate in normal activities as a result of shortness of breath. It is often triggered by changes in weather, changes in the season, changes in air temperature, or inside (home, school, daycare or work) allergens such as animal dander, mold, mildew, woodstoves or cockroaches.   It can also be triggered by hormonal changes, extreme emotion, physical exertion or an upper respiratory tract illness.     It is important to identify the trigger, and then eliminate or avoid the trigger if possible.   If you have been prescribed medications to be taken on a regular basis, it is important to follow the asthma action plan and to follow guidelines to adjust medication in response to increasing symptoms of decreased peak expiratory flow rate  Treatment: I have prescribed: Flovent inhaler. This is an inhaled corticosteroid. It can be used with the albuterol and Symbicort inhalers. Make sure to rinse mouth out following use to avoid Thrush.   HOME CARE Only take medications as instructed by your medical team. Consider wearing a mask or scarf to improve breathing air temperature have been shown to decrease irritation and decrease exacerbations Get rest. Taking a steamy shower or using a humidifier may help nasal congestion sand ease sore throat pain. You can place a towel over your head and breathe in the steam from hot water coming from a faucet. Using a saline nasal spray works much the same way.  Cough drops, hare candies and sore throat  lozenges may ease your cough.  Avoid close contacts especially the very you and the elderly Cover your mouth if you cough or sneeze Always remember to wash your hands.    GET HELP RIGHT AWAY IF: You develop worsening symptoms; breathlessness at rest, drowsy, confused or agitated, unable to speak in full sentences You have coughing fits You develop a severe headache or visual changes You develop shortness of breath, difficulty breathing or start having chest pain Your symptoms persist after you have completed your treatment plan If your symptoms do not improve within 10 days  MAKE SURE YOU Understand these instructions. Will watch your condition. Will get help right away if you are not doing well or get worse.   Your e-visit answers were reviewed by a board certified advanced clinical practitioner to complete your personal care plan, Depending upon the condition, your plan could have included both over the counter or prescription medications.   Please review your pharmacy choice. Your safety is important to [place]. If you have drug allergies check your prescription carefully.  You can use MyChart to ask questions about today's visit, request a non-urgent  call back, or ask for a work or school excuse for 24 hours related to this e-Visit. If it has been greater than 24 hours you will need to follow up with your provider, or enter a new e-Visit to address those concerns.   You will get an e-mail in the next two days asking about your experience. I hope that your e-visit has been valuable and will speed your recovery. Thank you for using e-visits.   I provided 5 minutes of non face-to-face time during this encounter for chart review and  documentation.

## 2022-02-11 ENCOUNTER — Encounter: Payer: Self-pay | Admitting: *Deleted

## 2022-03-23 ENCOUNTER — Telehealth: Payer: Self-pay | Admitting: Family

## 2022-03-23 DIAGNOSIS — J4551 Severe persistent asthma with (acute) exacerbation: Secondary | ICD-10-CM

## 2022-03-23 MED ORDER — PREDNISONE 20 MG PO TABS: 40.0000 mg | ORAL_TABLET | Freq: Every day | ORAL | 0 refills | Status: AC

## 2022-03-23 NOTE — Progress Notes (Signed)
Visit for Asthma  Based on what you have shared with me, it looks like you may have a flare up of your asthma.  Asthma is a chronic (ongoing) lung disease which results in airway obstruction, inflammation and hyper-responsiveness.   Asthma symptoms vary from person to person, with common symptoms including nighttime awakening and decreased ability to participate in normal activities as a result of shortness of breath. It is often triggered by changes in weather, changes in the season, changes in air temperature, or inside (home, school, daycare or work) allergens such as animal dander, mold, mildew, woodstoves or cockroaches.   It can also be triggered by hormonal changes, extreme emotion, physical exertion or an upper respiratory tract illness.     It is important to identify the trigger, and then eliminate or avoid the trigger if possible.   If you have been prescribed medications to be taken on a regular basis, it is important to follow the asthma action plan and to follow guidelines to adjust medication in response to increasing symptoms of decreased peak expiratory flow rate  Treatment: I have prescribed: Prednisone 40mg  by mouth per day for  7 days  You need to call and see your PCP this week.   HOME CARE Only take medications as instructed by your medical team. Consider wearing a mask or scarf to improve breathing air temperature have been shown to decrease irritation and decrease exacerbations Get rest. Taking a steamy shower or using a humidifier may help nasal congestion sand ease sore throat pain. You can place a towel over your head and breathe in the steam from hot water coming from a faucet. Using a saline nasal spray works much the same way.  Cough drops, hare candies and sore throat lozenges may ease your cough.  Avoid close contacts especially the very you and the  elderly Cover your mouth if you cough or sneeze Always remember to wash your hands.    GET HELP RIGHT AWAY IF: You develop worsening symptoms; breathlessness at rest, drowsy, confused or agitated, unable to speak in full sentences You have coughing fits You develop a severe headache or visual changes You develop shortness of breath, difficulty breathing or start having chest pain Your symptoms persist after you have completed your treatment plan If your symptoms do not improve within 10 days  MAKE SURE YOU Understand these instructions. Will watch your condition. Will get help right away if you are not doing well or get worse.   Your e-visit answers were reviewed by a board certified advanced clinical practitioner to complete your personal care plan, Depending upon the condition, your plan could have included both over the counter or prescription medications.   Please review your pharmacy choice. Your safety is important to . If you have drug allergies check your prescription carefully.  You can use MyChart to ask questions about today's visit, request a non-urgent  call back, or ask for a work or school excuse for 24 hours related to this e-Visit. If it has been greater than 24 hours you will need to follow up with your provider, or enter a new e-Visit to address those concerns.   You will get an e-mail in the next two days asking about your experience. I hope that your e-visit has been valuable and will speed your recovery. Thank you for using e-visits.   Approximately 5 minutes was spent documenting and reviewing patient's chart.

## 2022-04-16 ENCOUNTER — Encounter (INDEPENDENT_AMBULATORY_CARE_PROVIDER_SITE_OTHER): Payer: Self-pay

## 2022-06-20 ENCOUNTER — Telehealth: Payer: Self-pay | Admitting: Internal Medicine

## 2022-06-20 NOTE — Telephone Encounter (Signed)
MR faxed to [facility] [phone]

## 2022-07-05 ENCOUNTER — Telehealth: Payer: Self-pay | Admitting: Family

## 2022-07-05 DIAGNOSIS — Z20818 Contact with and (suspected) exposure to other bacterial communicable diseases: Secondary | ICD-10-CM

## 2022-07-05 DIAGNOSIS — J029 Acute pharyngitis, unspecified: Secondary | ICD-10-CM

## 2022-07-05 MED ORDER — AMOXICILLIN 500 MG PO CAPS: 500.0000 mg | ORAL_CAPSULE | Freq: Two times a day (BID) | ORAL | 0 refills | Status: AC

## 2022-07-05 NOTE — Progress Notes (Signed)

## 2022-07-24 ENCOUNTER — Telehealth: Payer: Self-pay | Admitting: Family Medicine

## 2022-07-24 DIAGNOSIS — J069 Acute upper respiratory infection, unspecified: Secondary | ICD-10-CM

## 2022-07-24 MED ORDER — FLUTICASONE PROPIONATE 50 MCG/ACT NA SUSP: 2.0000 | Freq: Every day | NASAL | 0 refills | Status: AC

## 2022-07-24 NOTE — Progress Notes (Signed)
E-Visit for Upper Respiratory Infection   We are encouraging COVID test now due to increase cases. Please consider taking one.  We are sorry you are not feeling well.  Here is how we plan to help!  Based on what you have shared with me, it looks like you may have a viral upper respiratory infection.  Upper respiratory infections are caused by a large number of viruses; however, rhinovirus is the most common cause.   Symptoms vary from person to person, with common symptoms including sore throat, cough, fatigue or lack of energy and feeling of general discomfort.  A low-grade fever of up to 100.4 may present, but is often uncommon.  Symptoms vary however, and are closely related to a person's age or underlying illnesses.  The most common symptoms associated with an upper respiratory infection are nasal discharge or congestion, cough, sneezing, headache and pressure in the ears and face.  These symptoms usually persist for about 3 to 10 days, but can last up to 2 weeks.  It is important to know that upper respiratory infections do not cause serious illness or complications in most cases.    Upper respiratory infections can be transmitted from person to person, with the most common method of transmission being a person's hands.  The virus is able to live on the skin and can infect other persons for up to 2 hours after direct contact.  Also, these can be transmitted when someone coughs or sneezes; thus, it is important to cover the mouth to reduce this risk.  To keep the spread of the illness at bay, good hand hygiene is very important.  This is an infection that is most likely caused by a virus. There are no specific treatments other than to help you with the symptoms until the infection runs its course.  We are sorry you are not feeling well.  Here is how we plan to help!   For nasal congestion, you may use an oral decongestants such as Mucinex D or if you have glaucoma or high blood pressure use plain  Mucinex.  Saline nasal spray or nasal drops can help and can safely be used as often as needed for congestion.  For your congestion, I have prescribed Fluticasone nasal spray one spray in each nostril twice a day  If you do not have a history of heart disease, hypertension, diabetes or thyroid disease, prostate/bladder issues or glaucoma, you may also use Sudafed to treat nasal congestion.  It is highly recommended that you consult with a pharmacist or your primary care physician to ensure this medication is safe for you to take.     If you have a cough, you may use cough suppressants such as Delsym and Robitussin.  If you have glaucoma or high blood pressure, you can also use Coricidin HBP.     If you have a sore or scratchy throat, use a saltwater gargle-  to  teaspoon of salt dissolved in a 4-ounce to 8-ounce glass of warm water.  Gargle the solution for approximately 15-30 seconds and then spit.  It is important not to swallow the solution.  You can also use throat lozenges/cough drops and Chloraseptic spray to help with throat pain or discomfort.  Warm or cold liquids can also be helpful in relieving throat pain.  For headache, pain or general discomfort, you can use Ibuprofen or Tylenol as directed.   Some authorities believe that zinc sprays or the use of Echinacea may shorten the course  of your symptoms.   HOME CARE Only take medications as instructed by your medical team. Be sure to drink plenty of fluids. Water is fine as well as fruit juices, sodas and electrolyte beverages. You may want to stay away from caffeine or alcohol. If you are nauseated, try taking small sips of liquids. How do you know if you are getting enough fluid? Your urine should be a pale yellow or almost colorless. Get rest. Taking a steamy shower or using a humidifier may help nasal congestion and ease sore throat pain. You can place a towel over your head and breathe in the steam from hot water coming from a  faucet. Using a saline nasal spray works much the same way. Cough drops, hard candies and sore throat lozenges may ease your cough. Avoid close contacts especially the very young and the elderly Cover your mouth if you cough or sneeze Always remember to wash your hands.   GET HELP RIGHT AWAY IF: You develop worsening fever. If your symptoms do not improve within 10 days You develop yellow or green discharge from your nose over 3 days. You have coughing fits You develop a severe head ache or visual changes. You develop shortness of breath, difficulty breathing or start having chest pain Your symptoms persist after you have completed your treatment plan  MAKE SURE YOU  Understand these instructions. Will watch your condition. Will get help right away if you are not doing well or get worse.  Thank you for choosing an e-visit.  Your e-visit answers were reviewed by a board certified advanced clinical practitioner to complete your personal care plan. Depending upon the condition, your plan could have included both over the counter or prescription medications.  Please review your pharmacy choice. Make sure the pharmacy is open so you can pick up prescription now. If there is a problem, you may contact your provider through [employer] and have the prescription routed to another pharmacy.  Your safety is important to [place]. If you have drug allergies check your prescription carefully.   For the next 24 hours you can use MyChart to ask questions about today's visit, request a non-urgent call back, or ask for a work or school excuse. You will get an email in the next two days asking about your experience. I hope that your e-visit has been valuable and will speed your recovery.    I provided 5 minutes of non face-to-face time during this encounter for chart review, medication and order placement, as well as and documentation.
